# Patient Record
Sex: Male | Born: 1945
Health system: Southern US, Community
[De-identification: ages and names within clinical notes are randomized; demographics above are authoritative.]

## PROBLEM LIST (undated history)

## (undated) DIAGNOSIS — E785 Hyperlipidemia, unspecified: Secondary | ICD-10-CM

## (undated) DIAGNOSIS — M79673 Pain in unspecified foot: Secondary | ICD-10-CM

## (undated) DIAGNOSIS — N289 Disorder of kidney and ureter, unspecified: Secondary | ICD-10-CM

## (undated) DIAGNOSIS — I10 Essential (primary) hypertension: Secondary | ICD-10-CM

## (undated) DIAGNOSIS — G8929 Other chronic pain: Secondary | ICD-10-CM

## (undated) DIAGNOSIS — M79606 Pain in leg, unspecified: Secondary | ICD-10-CM

## (undated) DIAGNOSIS — I8390 Asymptomatic varicose veins of unspecified lower extremity: Secondary | ICD-10-CM

## (undated) DIAGNOSIS — F419 Anxiety disorder, unspecified: Secondary | ICD-10-CM

## (undated) DIAGNOSIS — F329 Major depressive disorder, single episode, unspecified: Secondary | ICD-10-CM

## (undated) DIAGNOSIS — H269 Unspecified cataract: Secondary | ICD-10-CM

## (undated) DIAGNOSIS — N189 Chronic kidney disease, unspecified: Secondary | ICD-10-CM

## (undated) DIAGNOSIS — T7840XA Allergy, unspecified, initial encounter: Secondary | ICD-10-CM

## (undated) DIAGNOSIS — K449 Diaphragmatic hernia without obstruction or gangrene: Secondary | ICD-10-CM

## (undated) HISTORY — PX: INGUINAL HERNIA REPAIR: SHX194

## (undated) HISTORY — PX: VARICOSE VEIN SURGERY: SHX832

## (undated) HISTORY — DX: Hyperlipidemia, unspecified: E78.5

## (undated) HISTORY — PX: COLONOSCOPY: SHX174

## (undated) HISTORY — DX: Diaphragmatic hernia without obstruction or gangrene: K44.9

## (undated) HISTORY — DX: Essential (primary) hypertension: I10

## (undated) HISTORY — DX: Major depressive disorder, single episode, unspecified: F32.9

## (undated) HISTORY — DX: Asymptomatic varicose veins of unspecified lower extremity: I83.90

## (undated) HISTORY — PX: COLONOSCOPY W/ POLYPECTOMY: SHX1380

## (undated) HISTORY — DX: Unspecified cataract: H26.9

## (undated) HISTORY — DX: Allergy, unspecified, initial encounter: T78.40XA

## (undated) HISTORY — PX: CHOLECYSTECTOMY: SHX55

## (undated) HISTORY — DX: Anxiety disorder, unspecified: F41.9

## (undated) HISTORY — DX: Pain in unspecified foot: M79.673

## (undated) HISTORY — DX: Other chronic pain: G89.29

## (undated) HISTORY — PX: CARPAL TUNNEL RELEASE: SHX101

## (undated) HISTORY — PX: EYE SURGERY: SHX253

## (undated) HISTORY — DX: Chronic kidney disease, unspecified: N18.9

## (undated) HISTORY — DX: Disorder of kidney and ureter, unspecified: N28.9

## (undated) HISTORY — DX: Pain in leg, unspecified: M79.606

---

## 2000-03-21 HISTORY — PX: JOINT REPLACEMENT: SHX530

## 2000-03-21 HISTORY — PX: TOTAL KNEE ARTHROPLASTY: SHX125

## 2002-02-11 ENCOUNTER — Encounter: Payer: Self-pay | Admitting: Orthopedic Surgery

## 2002-02-18 ENCOUNTER — Inpatient Hospital Stay (HOSPITAL_COMMUNITY): Admission: RE | Admit: 2002-02-18 | Discharge: 2002-02-22 | Payer: Self-pay | Admitting: Orthopedic Surgery

## 2002-09-16 ENCOUNTER — Ambulatory Visit (HOSPITAL_COMMUNITY): Admission: RE | Admit: 2002-09-16 | Discharge: 2002-09-16 | Payer: Self-pay | Admitting: Specialist

## 2002-10-28 ENCOUNTER — Ambulatory Visit (HOSPITAL_COMMUNITY): Admission: RE | Admit: 2002-10-28 | Discharge: 2002-10-28 | Payer: Self-pay | Admitting: Specialist

## 2004-04-16 ENCOUNTER — Ambulatory Visit: Payer: Self-pay | Admitting: Family Medicine

## 2004-09-14 ENCOUNTER — Ambulatory Visit: Payer: Self-pay | Admitting: Internal Medicine

## 2004-09-23 ENCOUNTER — Ambulatory Visit: Payer: Self-pay | Admitting: Internal Medicine

## 2004-09-28 ENCOUNTER — Ambulatory Visit: Payer: Self-pay | Admitting: Internal Medicine

## 2004-10-12 ENCOUNTER — Encounter (INDEPENDENT_AMBULATORY_CARE_PROVIDER_SITE_OTHER): Payer: Self-pay | Admitting: Specialist

## 2004-10-12 ENCOUNTER — Ambulatory Visit: Payer: Self-pay | Admitting: Internal Medicine

## 2004-10-12 LAB — HM COLONOSCOPY

## 2004-10-21 ENCOUNTER — Ambulatory Visit: Payer: Self-pay | Admitting: Internal Medicine

## 2005-03-15 ENCOUNTER — Ambulatory Visit: Payer: Self-pay | Admitting: Internal Medicine

## 2005-04-01 ENCOUNTER — Ambulatory Visit: Payer: Self-pay | Admitting: Internal Medicine

## 2006-01-11 ENCOUNTER — Ambulatory Visit: Payer: Self-pay | Admitting: Internal Medicine

## 2006-03-08 ENCOUNTER — Ambulatory Visit: Payer: Self-pay | Admitting: Internal Medicine

## 2006-12-04 DIAGNOSIS — N259 Disorder resulting from impaired renal tubular function, unspecified: Secondary | ICD-10-CM | POA: Insufficient documentation

## 2006-12-04 DIAGNOSIS — E785 Hyperlipidemia, unspecified: Secondary | ICD-10-CM | POA: Insufficient documentation

## 2007-02-22 ENCOUNTER — Ambulatory Visit: Payer: Self-pay | Admitting: Internal Medicine

## 2007-02-22 LAB — CONVERTED CEMR LAB
ALT: 22 units/L (ref 0–53)
AST: 15 units/L (ref 0–37)
Albumin: 3.7 g/dL (ref 3.5–5.2)
Alkaline Phosphatase: 90 units/L (ref 39–117)
BUN: 18 mg/dL (ref 6–23)
Basophils Absolute: 0 10*3/uL (ref 0.0–0.1)
Basophils Relative: 0.1 % (ref 0.0–1.0)
Bilirubin Urine: NEGATIVE
Bilirubin, Direct: 0.2 mg/dL (ref 0.0–0.3)
Blood in Urine, dipstick: NEGATIVE
CO2: 31 meq/L (ref 19–32)
Calcium: 9.1 mg/dL (ref 8.4–10.5)
Chloride: 109 meq/L (ref 96–112)
Cholesterol: 207 mg/dL (ref 0–200)
Creatinine, Ser: 1.4 mg/dL (ref 0.4–1.5)
Direct LDL: 136.3 mg/dL
Eosinophils Absolute: 0.1 10*3/uL (ref 0.0–0.6)
Eosinophils Relative: 2.5 % (ref 0.0–5.0)
GFR calc Af Amer: 66 mL/min
GFR calc non Af Amer: 55 mL/min
Glucose, Bld: 89 mg/dL (ref 70–99)
Glucose, Urine, Semiquant: NEGATIVE
HCT: 43.2 % (ref 39.0–52.0)
HDL: 34.2 mg/dL — ABNORMAL LOW (ref >39.0)
Hemoglobin: 15 g/dL (ref 13.0–17.0)
Ketones, urine, test strip: NEGATIVE
Lymphocytes Relative: 27.6 % (ref 12.0–46.0)
MCHC: 34.7 g/dL (ref 30.0–36.0)
MCV: 84.8 fL (ref 78.0–100.0)
Monocytes Absolute: 0.4 10*3/uL (ref 0.2–0.7)
Monocytes Relative: 9.3 % (ref 3.0–11.0)
Neutro Abs: 2.5 10*3/uL (ref 1.4–7.7)
Neutrophils Relative %: 60.5 % (ref 43.0–77.0)
Nitrite: NEGATIVE
PSA: 1.4 ng/mL (ref 0.10–4.00)
Platelets: 118 10*3/uL — ABNORMAL LOW (ref 150–400)
Potassium: 5.3 meq/L — ABNORMAL HIGH (ref 3.5–5.1)
Protein, U semiquant: NEGATIVE
RBC: 5.1 M/uL (ref 4.22–5.81)
RDW: 14 % (ref 11.5–14.6)
Sodium: 144 meq/L (ref 135–145)
Specific Gravity, Urine: 1.02
TSH: 2.03 microintl units/mL (ref 0.35–5.50)
Total Bilirubin: 0.8 mg/dL (ref 0.3–1.2)
Total CHOL/HDL Ratio: 6.1
Total Protein: 5.8 g/dL — ABNORMAL LOW (ref 6.0–8.3)
Triglycerides: 164 mg/dL — ABNORMAL HIGH (ref 0–149)
Urobilinogen, UA: 0.2
VLDL: 33 mg/dL (ref 0–40)
WBC Urine, dipstick: NEGATIVE
WBC: 4.2 10*3/uL — ABNORMAL LOW (ref 4.5–10.5)
pH: 5

## 2007-03-02 ENCOUNTER — Ambulatory Visit: Payer: Self-pay | Admitting: Internal Medicine

## 2007-03-02 DIAGNOSIS — I8310 Varicose veins of unspecified lower extremity with inflammation: Secondary | ICD-10-CM | POA: Insufficient documentation

## 2007-03-02 DIAGNOSIS — I10 Essential (primary) hypertension: Secondary | ICD-10-CM | POA: Insufficient documentation

## 2007-04-04 ENCOUNTER — Telehealth (INDEPENDENT_AMBULATORY_CARE_PROVIDER_SITE_OTHER): Payer: Self-pay | Admitting: *Deleted

## 2007-04-09 ENCOUNTER — Ambulatory Visit: Payer: Self-pay | Admitting: Internal Medicine

## 2007-04-09 DIAGNOSIS — N433 Hydrocele, unspecified: Secondary | ICD-10-CM | POA: Insufficient documentation

## 2007-06-27 ENCOUNTER — Ambulatory Visit: Payer: Self-pay | Admitting: Internal Medicine

## 2007-06-27 DIAGNOSIS — T887XXA Unspecified adverse effect of drug or medicament, initial encounter: Secondary | ICD-10-CM | POA: Insufficient documentation

## 2007-06-27 LAB — CONVERTED CEMR LAB
ALT: 35 units/L (ref 0–53)
AST: 22 units/L (ref 0–37)
Albumin: 3.7 g/dL (ref 3.5–5.2)
Alkaline Phosphatase: 89 units/L (ref 39–117)
Bilirubin, Direct: 0.1 mg/dL (ref 0.0–0.3)
Cholesterol: 235 mg/dL (ref 0–200)
Direct LDL: 139.6 mg/dL
HDL: 33.2 mg/dL — ABNORMAL LOW (ref >39.0)
Total Bilirubin: 1 mg/dL (ref 0.3–1.2)
Total CHOL/HDL Ratio: 7.1
Total Protein: 6.2 g/dL (ref 6.0–8.3)
Triglycerides: 270 mg/dL (ref 0–149)
VLDL: 54 mg/dL — ABNORMAL HIGH (ref 0–40)

## 2007-07-06 ENCOUNTER — Ambulatory Visit: Payer: Self-pay | Admitting: Internal Medicine

## 2007-07-06 DIAGNOSIS — J309 Allergic rhinitis, unspecified: Secondary | ICD-10-CM | POA: Insufficient documentation

## 2007-08-03 ENCOUNTER — Ambulatory Visit: Payer: Self-pay | Admitting: Internal Medicine

## 2007-08-03 DIAGNOSIS — M109 Gout, unspecified: Secondary | ICD-10-CM | POA: Insufficient documentation

## 2007-08-03 LAB — CONVERTED CEMR LAB
BUN: 16 mg/dL (ref 6–23)
CO2: 31 meq/L (ref 19–32)
Calcium: 9.1 mg/dL (ref 8.4–10.5)
Chloride: 110 meq/L (ref 96–112)
Creatinine, Ser: 1.4 mg/dL (ref 0.4–1.5)
GFR calc Af Amer: 66 mL/min
GFR calc non Af Amer: 54 mL/min
Glucose, Bld: 68 mg/dL — ABNORMAL LOW (ref 70–99)
Potassium: 4.2 meq/L (ref 3.5–5.1)
Sodium: 146 meq/L — ABNORMAL HIGH (ref 135–145)
Uric Acid, Serum: 6.5 mg/dL (ref 4.0–7.8)

## 2007-09-03 ENCOUNTER — Ambulatory Visit: Payer: Self-pay | Admitting: Internal Medicine

## 2007-10-17 ENCOUNTER — Ambulatory Visit (HOSPITAL_BASED_OUTPATIENT_CLINIC_OR_DEPARTMENT_OTHER): Admission: RE | Admit: 2007-10-17 | Discharge: 2007-10-17 | Payer: Self-pay | Admitting: Urology

## 2007-11-28 ENCOUNTER — Ambulatory Visit: Payer: Self-pay | Admitting: Internal Medicine

## 2007-11-28 LAB — CONVERTED CEMR LAB
ALT: 23 units/L (ref 0–53)
AST: 15 units/L (ref 0–37)
Albumin: 3.7 g/dL (ref 3.5–5.2)
Alkaline Phosphatase: 106 units/L (ref 39–117)
Bilirubin, Direct: 0.1 mg/dL (ref 0.0–0.3)
Cholesterol: 160 mg/dL (ref 0–200)
Direct LDL: 88.3 mg/dL
HDL: 23.9 mg/dL — ABNORMAL LOW (ref >39.0)
Total Bilirubin: 0.8 mg/dL (ref 0.3–1.2)
Total CHOL/HDL Ratio: 6.7
Total Protein: 6 g/dL (ref 6.0–8.3)
Triglycerides: 214 mg/dL (ref 0–149)
VLDL: 43 mg/dL — ABNORMAL HIGH (ref 0–40)

## 2007-12-05 ENCOUNTER — Ambulatory Visit: Payer: Self-pay | Admitting: Internal Medicine

## 2008-02-25 ENCOUNTER — Ambulatory Visit: Payer: Self-pay | Admitting: Internal Medicine

## 2008-02-25 LAB — CONVERTED CEMR LAB
ALT: 22 units/L (ref 0–53)
AST: 16 units/L (ref 0–37)
Basophils Absolute: 0 10*3/uL (ref 0.0–0.1)
Bilirubin Urine: NEGATIVE
Bilirubin, Direct: 0.1 mg/dL (ref 0.0–0.3)
Blood in Urine, dipstick: NEGATIVE
CO2: 28 meq/L (ref 19–32)
Chloride: 109 meq/L (ref 96–112)
Cholesterol: 171 mg/dL (ref 0–200)
GFR calc non Af Amer: 65 mL/min
Glucose, Urine, Semiquant: NEGATIVE
HDL: 35.6 mg/dL — ABNORMAL LOW (ref >39.0)
Ketones, urine, test strip: NEGATIVE
LDL Cholesterol: 98 mg/dL (ref 0–99)
Lymphocytes Relative: 25.2 % (ref 12.0–46.0)
MCHC: 34 g/dL (ref 30.0–36.0)
Neutrophils Relative %: 64.2 % (ref 43.0–77.0)
Nitrite: NEGATIVE
Platelets: 130 10*3/uL — ABNORMAL LOW (ref 150–400)
Protein, U semiquant: NEGATIVE
RDW: 14.2 % (ref 11.5–14.6)
Sodium: 145 meq/L (ref 135–145)
Specific Gravity, Urine: 1.02
TSH: 2.48 microintl units/mL (ref 0.35–5.50)
Total Bilirubin: 0.7 mg/dL (ref 0.3–1.2)
Triglycerides: 188 mg/dL — ABNORMAL HIGH (ref 0–149)
Urobilinogen, UA: 0.2
VLDL: 38 mg/dL (ref 0–40)
WBC Urine, dipstick: NEGATIVE
pH: 5

## 2008-03-03 ENCOUNTER — Ambulatory Visit: Payer: Self-pay | Admitting: Internal Medicine

## 2008-03-03 DIAGNOSIS — R972 Elevated prostate specific antigen [PSA]: Secondary | ICD-10-CM | POA: Insufficient documentation

## 2008-03-19 ENCOUNTER — Telehealth: Payer: Self-pay | Admitting: Family Medicine

## 2008-05-06 ENCOUNTER — Ambulatory Visit: Payer: Self-pay | Admitting: Internal Medicine

## 2008-05-20 LAB — CONVERTED CEMR LAB
PSA, Free Pct: 23 — ABNORMAL LOW (ref >25)
PSA, Free: 0.4 ng/mL

## 2008-06-03 ENCOUNTER — Ambulatory Visit: Payer: Self-pay | Admitting: Internal Medicine

## 2008-06-03 DIAGNOSIS — N400 Enlarged prostate without lower urinary tract symptoms: Secondary | ICD-10-CM | POA: Insufficient documentation

## 2008-06-03 DIAGNOSIS — G5620 Lesion of ulnar nerve, unspecified upper limb: Secondary | ICD-10-CM | POA: Insufficient documentation

## 2008-06-25 ENCOUNTER — Encounter: Payer: Self-pay | Admitting: Internal Medicine

## 2008-07-09 ENCOUNTER — Encounter: Payer: Self-pay | Admitting: Internal Medicine

## 2008-07-19 HISTORY — PX: OTHER SURGICAL HISTORY: SHX169

## 2008-07-23 ENCOUNTER — Ambulatory Visit (HOSPITAL_COMMUNITY): Admission: RE | Admit: 2008-07-23 | Discharge: 2008-07-23 | Payer: Self-pay | Admitting: Neurological Surgery

## 2008-08-04 ENCOUNTER — Ambulatory Visit: Payer: Self-pay | Admitting: Internal Medicine

## 2008-08-04 DIAGNOSIS — F528 Other sexual dysfunction not due to a substance or known physiological condition: Secondary | ICD-10-CM | POA: Insufficient documentation

## 2008-11-19 ENCOUNTER — Ambulatory Visit: Payer: Self-pay | Admitting: Internal Medicine

## 2009-02-18 ENCOUNTER — Ambulatory Visit: Payer: Self-pay | Admitting: Internal Medicine

## 2009-02-18 DIAGNOSIS — M549 Dorsalgia, unspecified: Secondary | ICD-10-CM | POA: Insufficient documentation

## 2009-02-18 LAB — CONVERTED CEMR LAB: PSA: 1.41 ng/mL (ref 0.10–4.00)

## 2009-02-25 ENCOUNTER — Telehealth: Payer: Self-pay | Admitting: Internal Medicine

## 2009-02-27 ENCOUNTER — Ambulatory Visit (HOSPITAL_BASED_OUTPATIENT_CLINIC_OR_DEPARTMENT_OTHER): Admission: RE | Admit: 2009-02-27 | Discharge: 2009-02-27 | Payer: Self-pay | Admitting: Orthopedic Surgery

## 2009-05-11 ENCOUNTER — Telehealth: Payer: Self-pay | Admitting: Internal Medicine

## 2009-05-15 ENCOUNTER — Ambulatory Visit (HOSPITAL_COMMUNITY): Admission: RE | Admit: 2009-05-15 | Discharge: 2009-05-15 | Payer: Self-pay | Admitting: Orthopedic Surgery

## 2009-05-19 ENCOUNTER — Ambulatory Visit: Payer: Self-pay | Admitting: Internal Medicine

## 2009-05-19 LAB — CONVERTED CEMR LAB
Alkaline Phosphatase: 80 units/L (ref 39–117)
Bilirubin, Direct: 0.1 mg/dL (ref 0.0–0.3)
Calcium: 8.4 mg/dL (ref 8.4–10.5)
Cholesterol, target level: 200 mg/dL
Cholesterol: 193 mg/dL (ref 0–200)
GFR calc non Af Amer: 49.92 mL/min (ref >60)
Glucose, Bld: 121 mg/dL — ABNORMAL HIGH (ref 70–99)
HDL: 43.1 mg/dL (ref >39.00)
PSA: 1.37 ng/mL (ref 0.10–4.00)
Potassium: 4.5 meq/L (ref 3.5–5.1)
Sodium: 145 meq/L (ref 135–145)
Total Bilirubin: 0.4 mg/dL (ref 0.3–1.2)
Total Protein: 6 g/dL (ref 6.0–8.3)

## 2009-07-06 ENCOUNTER — Observation Stay (HOSPITAL_COMMUNITY): Admission: AD | Admit: 2009-07-06 | Discharge: 2009-07-09 | Payer: Self-pay | Admitting: Orthopedic Surgery

## 2009-08-21 ENCOUNTER — Ambulatory Visit: Payer: Self-pay | Admitting: Internal Medicine

## 2009-08-21 DIAGNOSIS — L570 Actinic keratosis: Secondary | ICD-10-CM | POA: Insufficient documentation

## 2009-09-04 ENCOUNTER — Encounter (INDEPENDENT_AMBULATORY_CARE_PROVIDER_SITE_OTHER): Payer: Self-pay | Admitting: *Deleted

## 2009-10-07 ENCOUNTER — Telehealth: Payer: Self-pay | Admitting: Internal Medicine

## 2009-11-10 ENCOUNTER — Telehealth: Payer: Self-pay | Admitting: Internal Medicine

## 2009-12-07 ENCOUNTER — Telehealth: Payer: Self-pay | Admitting: Internal Medicine

## 2010-01-13 ENCOUNTER — Telehealth: Payer: Self-pay | Admitting: Internal Medicine

## 2010-02-17 ENCOUNTER — Ambulatory Visit: Payer: Self-pay | Admitting: Internal Medicine

## 2010-02-17 DIAGNOSIS — Z86010 Personal history of colonic polyps: Secondary | ICD-10-CM | POA: Insufficient documentation

## 2010-02-17 LAB — CONVERTED CEMR LAB
Basophils Absolute: 0 10*3/uL (ref 0.0–0.1)
Basophils Relative: 0.6 % (ref 0.0–3.0)
Eosinophils Relative: 1.2 % (ref 0.0–5.0)
HCT: 44.1 % (ref 39.0–52.0)
Hemoglobin: 15.1 g/dL (ref 13.0–17.0)
Lymphocytes Relative: 20.5 % (ref 12.0–46.0)
Lymphs Abs: 1.1 10*3/uL (ref 0.7–4.0)
Monocytes Relative: 7.2 % (ref 3.0–12.0)
Neutro Abs: 3.7 10*3/uL (ref 1.4–7.7)
PSA, Free Pct: 39 (ref >25)
PSA, Free: 0.6 ng/mL
RBC: 5.08 M/uL (ref 4.22–5.81)
WBC: 5.2 10*3/uL (ref 4.5–10.5)

## 2010-03-04 ENCOUNTER — Encounter: Payer: Self-pay | Admitting: Internal Medicine

## 2010-04-06 ENCOUNTER — Encounter (INDEPENDENT_AMBULATORY_CARE_PROVIDER_SITE_OTHER): Payer: Self-pay | Admitting: *Deleted

## 2010-04-09 ENCOUNTER — Ambulatory Visit
Admission: RE | Admit: 2010-04-09 | Discharge: 2010-04-09 | Payer: Self-pay | Source: Home / Self Care | Attending: Internal Medicine | Admitting: Internal Medicine

## 2010-04-20 NOTE — Assessment & Plan Note (Signed)
**Note De-Identified Mckissic Obfuscation** Summary: 3 month rov/njr   Vital Signs:  Patient profile:   65 year old male Height:      62 inches Weight:      258 pounds BMI:     47.36 Temp:     98.2 degrees F oral Pulse rate:   72 / minute Resp:     14 per minute BP sitting:   140 / 80  (left arm)  Vitals Entered By: Willy Eddy, LPN (May 20, 4538 10:59 AM) CC: roa - continues to take meds for gout, Hypertension Management, Lipid Management   CC:  roa - continues to take meds for gout, Hypertension Management, and Lipid Management.  History of Present Illness: gaining weight discussed the stress and overeating monitering the HTN   Hypertension History:      He denies headache, chest pain, palpitations, dyspnea with exertion, orthopnea, PND, peripheral edema, visual symptoms, neurologic problems, syncope, and side effects from treatment.  stable.        Positive major cardiovascular risk factors include male age 15 years old or older, hyperlipidemia, and hypertension.  Negative major cardiovascular risk factors include negative family history for ischemic heart disease and non-tobacco-user status.        Further assessment for target organ damage reveals no history of ASHD, stroke/TIA, or peripheral vascular disease.    Lipid Management History:      Positive NCEP/ATP III risk factors include male age 65 years old or older, HDL cholesterol less than 40, and hypertension.  Negative NCEP/ATP III risk factors include no family history for ischemic heart disease, non-tobacco-user status, no ASHD (atherosclerotic heart disease), no prior stroke/TIA, and no peripheral vascular disease.      Preventive Screening-Counseling & Management  Alcohol-Tobacco     Smoking Status: never     Passive Smoke Exposure: no  Problems Prior to Update: 1)  Back Pain, Chronic  (ICD-724.5) 2)  Erectile Dysfunction  (ICD-302.72) 3)  Ulnar Neuropathy, Left  (ICD-354.2) 4)  Benign Prostatic Hypertrophy, With Obstruction  (ICD-600.01) 5)   Elevated Prostate Specific Antigen  (ICD-790.93) 6)  Uri  (ICD-465.9) 7)  Gout  (ICD-274.9) 8)  Allergic Rhinitis  (ICD-477.9) 9)  Uns Advrs Eff Uns Rx Medicinal&biological Sbstnc  (ICD-995.20) 10)  Hydrocele, Right  (ICD-603.9) 11)  Varicose Veins Lower Extremities W/inflammation  (ICD-454.1) 12)  Hypertension  (ICD-401.9) 13)  Physical Examination  (ICD-V70.0) 14)  Renal Insufficiency  (ICD-588.9) 15)  Hyperlipidemia  (ICD-272.4)  Current Problems (verified): 1)  Back Pain, Chronic  (ICD-724.5) 2)  Erectile Dysfunction  (ICD-302.72) 3)  Ulnar Neuropathy, Left  (ICD-354.2) 4)  Benign Prostatic Hypertrophy, With Obstruction  (ICD-600.01) 5)  Elevated Prostate Specific Antigen  (ICD-790.93) 6)  Uri  (ICD-465.9) 7)  Gout  (ICD-274.9) 8)  Allergic Rhinitis  (ICD-477.9) 9)  Uns Advrs Eff Uns Rx Medicinal&biological Sbstnc  (ICD-995.20) 10)  Hydrocele, Right  (ICD-603.9) 11)  Varicose Veins Lower Extremities W/inflammation  (ICD-454.1) 12)  Hypertension  (ICD-401.9) 13)  Physical Examination  (ICD-V70.0) 14)  Renal Insufficiency  (ICD-588.9) 15)  Hyperlipidemia  (ICD-272.4)  Medications Prior to Update: 1)  Viagra 100 Mg  Tabs (Sildenafil Citrate) .... One By Mouth Daily 2)  Xyzal 5 Mg  Tabs (Levocetirizine Dihydrochloride) .... One By Mouth Daily 3)  Flomax 0.4 Mg Cp24 (Tamsulosin Hcl) .... Take One By Mouth Daily 4)  Benicar 20 Mg Tabs (Olmesartan Medoxomil) .... One By Mouth Daily 5)  Vesicare 10 Mg Tabs (Solifenacin Succinate) .... One By Mouth Daily **Note De-Identified [place] Obfuscation** 6)  Avodart 0.5 Mg Caps (Dutasteride) .... One By Mouth Daily 7)  Simvastatin 40 Mg Tabs (Simvastatin) .... One By Mouth Daily 8)  Percocet 7.5-325 Mg Tabs (Oxycodone-Acetaminophen) .... One By Mouth Q 8 Hours Prn 9)  Doxycycline Hyclate 100 Mg Caps (Doxycycline Hyclate) .... One By Mouth Daily  Current Medications (verified): 1)  Viagra 100 Mg  Tabs (Sildenafil Citrate) .... One By Mouth Daily 2)  Xyzal 5 Mg  Tabs  (Levocetirizine Dihydrochloride) .... One By Mouth Daily 3)  Flomax 0.4 Mg Cp24 (Tamsulosin Hcl) .... Take One By Mouth Daily 4)  Benicar 20 Mg Tabs (Olmesartan Medoxomil) .... One By Mouth Daily 5)  Vesicare 10 Mg Tabs (Solifenacin Succinate) .... One By Mouth Daily 6)  Avodart 0.5 Mg Caps (Dutasteride) .... One By Mouth Daily 7)  Simvastatin 40 Mg Tabs (Simvastatin) .... One By Mouth Daily 8)  Percocet 7.5-325 Mg Tabs (Oxycodone-Acetaminophen) .... One By Mouth Q 8 Hours Prn 9)  Indomethacin 25 Mg Caps (Indomethacin) .Marland Kitchen.. 1 Three Times A Day 10)  Colcrys 0.6 Mg Tabs (Colchicine) .Marland Kitchen.. 1 Two Times A Day  Allergies (verified): 1)  ! Sulfa 2)  ! Septra Ds (Sulfamethoxazole-Trimethoprim) 3)  Ciprofloxacin Hcl (Ciprofloxacin Hcl)  Past History:  Social History: Last updated: 12/04/2006 Occupation: Married Never Smoked  Risk Factors: Smoking Status: never (05/19/2009) Passive Smoke Exposure: no (05/19/2009)  Past medical, surgical, family and social histories (including risk factors) reviewed, and no changes noted (except as noted below).  Past Medical History: Reviewed history from 08/03/2007 and no changes required. Chronic Leg & Foot Pain Hyperlipidemia Renal Disease Renal insufficiency Hiatal Hernia Hematuria Hypertension Allergic rhinitis Gout  Past Surgical History: Reviewed history from 08/04/2008 and no changes required. Colonoscopy Inguinal herniorrhaphy Colon polypectomy ulnar intrapment release 5.2010  Family History: Reviewed history and no changes required.  Social History: Reviewed history from 12/04/2006 and no changes required. Occupation: Married Never Smoked  Review of Systems  The patient denies anorexia, fever, weight loss, weight gain, vision loss, decreased hearing, hoarseness, chest pain, syncope, dyspnea on exertion, peripheral edema, prolonged cough, headaches, hemoptysis, abdominal pain, melena, hematochezia, severe  indigestion/heartburn, hematuria, incontinence, genital sores, muscle weakness, suspicious skin lesions, transient blindness, difficulty walking, depression, unusual weight change, abnormal bleeding, enlarged lymph nodes, angioedema, and breast masses.    Physical Exam  General:  Well-developed,well-nourished,in no acute distress; alert,appropriate and cooperative throughout examination Head:  male-pattern balding.  atraumatic.   Eyes:  pupils equal and pupils round.   Ears:  R ear normal and L ear normal.   Nose:  no external deformity and no nasal discharge.   Mouth:  posterior lymphoid hypertrophy and postnasal drip.   Neck:  No deformities, masses, or tenderness noted. Lungs:  normal respiratory effort and no wheezes.   Heart:  normal rate and regular rhythm.   Abdomen:  soft and non-tender.     Impression & Recommendations:  Problem # 1:  HYPERLIPIDEMIA (ICD-272.4)  His updated medication list for this problem includes:    Simvastatin 40 Mg Tabs (Simvastatin) ..... One by mouth daily  Labs Reviewed: SGOT: 16 (02/25/2008)   SGPT: 22 (02/25/2008)  Lipid Goals: Chol Goal: 200 (05/19/2009)   HDL Goal: 40 (05/19/2009)   LDL Goal: 130 (05/19/2009)   TG Goal: 150 (05/19/2009)  10 Yr Risk Heart Disease: 14 % Prior 10 Yr Risk Heart Disease: 7 % (02/18/2009)   HDL:35.6 (02/25/2008), 23.9 (11/28/2007)  LDL:98 (02/25/2008), DEL (18/84/1660)  Chol:171 (02/25/2008), 160 (11/28/2007)  Trig:188 (02/25/2008), 214 ( **Note De-Identified Cisek Obfuscation** 11/28/2007)  Orders: TLB-Cholesterol, HDL (83718-HDL) TLB-Cholesterol, Direct LDL (83721-DIRLDL) TLB-Cholesterol, Total (82465-CHO)  Problem # 2:  HYPERTENSION (ICD-401.9)  stable His updated medication list for this problem includes:    Benicar 20 Mg Tabs (Olmesartan medoxomil) ..... One by mouth daily  BP today: 140/80 Prior BP: 122/82 (02/18/2009)  10 Yr Risk Heart Disease: 14 % Prior 10 Yr Risk Heart Disease: 7 % (02/18/2009)  Labs Reviewed: K+: 4.8  (02/25/2008) Creat: : 1.2 (02/25/2008)   Chol: 171 (02/25/2008)   HDL: 35.6 (02/25/2008)   LDL: 98 (02/25/2008)   TG: 188 (02/25/2008)  Orders: TLB-BMP (Basic Metabolic Panel-BMET) (80048-METABOL)  Problem # 3:  RENAL INSUFFICIENCY (ICD-588.9) monitering renal function on benicar  Problem # 4:  GOUT (ICD-274.9) continueing the gout His updated medication list for this problem includes:    Colcrys 0.6 Mg Tabs (Colchicine) .Marland Kitchen... 1 two times a day  Orders: Venipuncture (88416) TLB-Uric Acid, Blood (84550-URIC)  Elevate extremity; warm compresses, symptomatic relief and medication as directed.   Complete Medication List: 1)  Viagra 100 Mg Tabs (Sildenafil citrate) .... One by mouth daily 2)  Xyzal 5 Mg Tabs (Levocetirizine dihydrochloride) .... One by mouth daily 3)  Flomax 0.4 Mg Cp24 (Tamsulosin hcl) .... Take one by mouth daily 4)  Benicar 20 Mg Tabs (Olmesartan medoxomil) .... One by mouth daily 5)  Vesicare 10 Mg Tabs (Solifenacin succinate) .... One by mouth daily 6)  Avodart 0.5 Mg Caps (Dutasteride) .... One by mouth daily 7)  Simvastatin 40 Mg Tabs (Simvastatin) .... One by mouth daily 8)  Percocet 7.5-325 Mg Tabs (Oxycodone-acetaminophen) .... One by mouth q 8 hours prn 9)  Indomethacin 25 Mg Caps (Indomethacin) .Marland Kitchen.. 1 three times a day 10)  Colcrys 0.6 Mg Tabs (Colchicine) .Marland Kitchen.. 1 two times a day  Other Orders: TLB-PSA (Prostate Specific Antigen) (84153-PSA) TLB-Hepatic/Liver Function Pnl (80076-HEPATIC)  Hypertension Assessment/Plan:      The patient's hypertensive risk group is category B: At least one risk factor (excluding diabetes) with no target organ damage.  His calculated 10 year risk of coronary heart disease is 14 %.  Today's blood pressure is 140/80.  His blood pressure goal is < 140/90.  Lipid Assessment/Plan:      Based on NCEP/ATP III, the patient's risk factor category is "2 or more risk factors and a calculated 10 year CAD risk of < 20%".  The patient's  lipid goals are as follows: Total cholesterol goal is 200; LDL cholesterol goal is 130; HDL cholesterol goal is 40; Triglyceride goal is 150.  His LDL cholesterol goal has been met.    Patient Instructions: 1)  weight loss 2)  stay away for atkins 3)  look up the DASH diet 4)  www.dashdiet[place].org 5)  Please schedule a follow-up appointment in 3 months. Prescriptions: PERCOCET 7.5-325 MG TABS (OXYCODONE-ACETAMINOPHEN) one by mouth q 8 hours prn  #90 x 0   Entered and Authorized by:   Stacie Glaze MD   Signed by:   Stacie Glaze MD on 05/19/2009   Method used:   Print then Give to Patient   RxID:   [id number]   Handout requested. VIAGRA 100 MG  TABS (SILDENAFIL CITRATE) one by mouth daily  #5 x 11   Entered and Authorized by:   Stacie Glaze MD   Signed by:   Stacie Glaze MD on 05/19/2009   Method used:   Print then Give to Patient   RxID:   [id number]

## 2010-04-20 NOTE — Assessment & Plan Note (Signed)
**Note De-Identified Leath Obfuscation** Summary: fup/cjr   Vital Signs:  Patient profile:   65 year old male Height:      62 inches Weight:      254 pounds BMI:     46.63 Temp:     98.2 degrees F oral Pulse rate:   76 / minute Resp:     14 per minute BP sitting:   140 / 90  (left arm)  Vitals Entered By: Willy Eddy, LPN (February 17, 2010 3:44 PM) CC: roa Is Patient Diabetic? No   CC:  roa.  History of Present Illness: pt states he will do a colon after the first of the year   Preventive Screening-Counseling & Management  Alcohol-Tobacco     Smoking Status: never     Passive Smoke Exposure: no  Current Problems (verified): 1)  Actinic Keratosis  (ICD-702.0) 2)  Back Pain, Chronic  (ICD-724.5) 3)  Erectile Dysfunction  (ICD-302.72) 4)  Ulnar Neuropathy, Left  (ICD-354.2) 5)  Benign Prostatic Hypertrophy, With Obstruction  (ICD-600.01) 6)  Elevated Prostate Specific Antigen  (ICD-790.93) 7)  Uri  (ICD-465.9) 8)  Gout  (ICD-274.9) 9)  Allergic Rhinitis  (ICD-477.9) 10)  Uns Advrs Eff Uns Rx Medicinal&biological Sbstnc  (ICD-995.20) 11)  Hydrocele, Right  (ICD-603.9) 12)  Varicose Veins Lower Extremities W/inflammation  (ICD-454.1) 13)  Hypertension  (ICD-401.9) 14)  Physical Examination  (ICD-V70.0) 15)  Renal Insufficiency  (ICD-588.9) 16)  Hyperlipidemia  (ICD-272.4)  Current Medications (verified): 1)  Viagra 100 Mg  Tabs (Sildenafil Citrate) .... One By Mouth Daily 2)  Benicar 20 Mg Tabs (Olmesartan Medoxomil) .... One By Mouth Daily 3)  Simvastatin 40 Mg Tabs (Simvastatin) .... One By Mouth Daily 4)  Percocet 7.5-325 Mg Tabs (Oxycodone-Acetaminophen) .... One By Mouth Q 8 Hours Prn 5)  Allopurinol 100 Mg Tabs (Allopurinol) .... One By Mouth Q Hs  Allergies (verified): 1)  ! Sulfa 2)  ! Septra Ds (Sulfamethoxazole-Trimethoprim) 3)  Ciprofloxacin Hcl (Ciprofloxacin Hcl)  Past History:  Social History: Last updated: 12/04/2006 Occupation: Married Never Smoked  Risk  Factors: Smoking Status: never (02/17/2010) Passive Smoke Exposure: no (02/17/2010)  Past medical, surgical, family and social histories (including risk factors) reviewed, and no changes noted (except as noted below).  Past Medical History: Reviewed history from 08/03/2007 and no changes required. Chronic Leg & Foot Pain Hyperlipidemia Renal Disease Renal insufficiency Hiatal Hernia Hematuria Hypertension Allergic rhinitis Gout  Past Surgical History: Reviewed history from 08/04/2008 and no changes required. Colonoscopy Inguinal herniorrhaphy Colon polypectomy ulnar intrapment release 5.2010  Family History: Reviewed history and no changes required.  Social History: Reviewed history from 12/04/2006 and no changes required. Occupation: Married Never Smoked  Review of Systems  The patient denies anorexia, fever, weight loss, weight gain, vision loss, decreased hearing, hoarseness, chest pain, syncope, dyspnea on exertion, peripheral edema, prolonged cough, headaches, hemoptysis, abdominal pain, melena, hematochezia, severe indigestion/heartburn, hematuria, incontinence, genital sores, muscle weakness, suspicious skin lesions, transient blindness, difficulty walking, depression, unusual weight change, abnormal bleeding, enlarged lymph nodes, angioedema, and breast masses.    Physical Exam  General:  Well-developed,well-nourished,in no acute distress; alert,appropriate and cooperative throughout examination Head:  male-pattern balding.  atraumatic.   Eyes:  pupils equal and pupils round.   Ears:  R ear normal and L ear normal.   Nose:  no external deformity and no nasal discharge.   Mouth:  posterior lymphoid hypertrophy and postnasal drip.   Neck:  ak on neckfull ROM and no masses.   Lungs:  normal **Note De-Identified Showers Obfuscation** respiratory effort and no wheezes.   Heart:  normal rate and regular rhythm.   Abdomen:  soft and non-tender.     Impression & Recommendations:  Problem # 1:  PERSONAL  HISTORY OF COLONIC POLYPS (ICD-V12.72)  referral for colon in [facility] Colonoscopy: Adenomatous Polyp (10/12/2004) Td Booster: Historical (02/18/2005)   Chol: 193 (05/19/2009)   HDL: 43.10 (05/19/2009)   LDL: 98 (02/25/2008)   TG: 188 (02/25/2008) TSH: 2.48 (02/25/2008)   PSA: 1.37 (05/19/2009) Next Colonoscopy due:: 10/2009 (03/03/2008)  Discussed using sunscreen, use of alcohol, drug use, self testicular exam, routine dental care, routine eye care, routine physical exam, seat belts, multiple vitamins, osteoporosis prevention, adequate calcium intake in diet, and recommendations for immunizations.  Discussed exercise and checking cholesterol.  Discussed gun safety, safe sex, and contraception. Also recommend checking PSA.  Orders: Gastroenterology Referral (GI)  Problem # 2:  BACK PAIN, CHRONIC (ICD-724.5) refill pain meds and not increased  3 rx of 30 given no refill until 90 days back pain stable pattern His updated medication list for this problem includes:    Percocet 7.5-325 Mg Tabs (Oxycodone-acetaminophen) ..... One by mouth q 8 hours prn  Discussed use of moist heat or ice, modified activities, medications, and stretching/strengthening exercises. Back care instructions given. To be seen in 2 weeks if no improvement; sooner if worsening of symptoms.   Orders: TLB-CBC Platelet - w/Differential (85025-CBCD) Specimen Handling (04540)  Problem # 3:  VARICOSE VEINS LOWER EXTREMITIES W/INFLAMMATION (ICD-454.1) still painfull  Problem # 4:  HYPERTENSION (ICD-401.9) stable His updated medication list for this problem includes:    Benicar 20 Mg Tabs (Olmesartan medoxomil) ..... One by mouth daily  BP today: 140/90 Prior BP: 140/84 (08/21/2009)  Prior 10 Yr Risk Heart Disease: 14 % (05/19/2009)  Labs Reviewed: K+: 4.5 (05/19/2009) Creat: : 1.5 (05/19/2009)   Chol: 193 (05/19/2009)   HDL: 43.10 (05/19/2009)   LDL: 98 (02/25/2008)   TG: 188 (02/25/2008)  Problem # 5:  ELEVATED  PROSTATE SPECIFIC ANTIGEN (ICD-790.93)  monitering  Orders: Venipuncture (98119) T-PSA Total ([address]) T-PSA Free ([address]) Specimen Handling (99000)  Complete Medication List: 1)  Viagra 100 Mg Tabs (Sildenafil citrate) .... One by mouth daily 2)  Benicar 20 Mg Tabs (Olmesartan medoxomil) .... One by mouth daily 3)  Simvastatin 40 Mg Tabs (Simvastatin) .... One by mouth daily 4)  Percocet 7.5-325 Mg Tabs (Oxycodone-acetaminophen) .... One by mouth q 8 hours prn 5)  Allopurinol 100 Mg Tabs (Allopurinol) .... One by mouth q hs  Other Orders: TLB-Uric Acid, Blood (84550-URIC)  Patient Instructions: 1)  Please schedule a follow-up appointment in 3 months. Prescriptions: PERCOCET 7.5-325 MG TABS (OXYCODONE-ACETAMINOPHEN) one by mouth q 8 hours prn  #30 x 0   Entered and Authorized by:   Stacie Glaze MD   Signed by:   Stacie Glaze MD on 02/17/2010   Method used:   Print then Give to Patient   RxID:   [id number] PERCOCET 7.5-325 MG TABS (OXYCODONE-ACETAMINOPHEN) one by mouth q 8 hours prn  #30 x 0   Entered and Authorized by:   Stacie Glaze MD   Signed by:   Stacie Glaze MD on 02/17/2010   Method used:   Print then Give to Patient   RxID:   [id number] PERCOCET 7.5-325 MG TABS (OXYCODONE-ACETAMINOPHEN) one by mouth q 8 hours prn  #30 x 0   Entered and Authorized by:   Stacie Glaze MD   Signed by:   Jonny Ruiz **Note De-Identified Biscardi Obfuscation** Carolynn Sayers MD on 02/17/2010   Method used:   Print then Give to Patient   RxID:   [id number]    Orders Added: 1)  Venipuncture [36415] 2)  T-PSA Total [[address]] 3)  T-PSA Free [[address]] 4)  TLB-CBC Platelet - w/Differential [85025-CBCD] 5)  TLB-Uric Acid, Blood [84550-URIC] 6)  Gastroenterology Referral [GI] 7)  Specimen Handling [99000] 8)  Est. Patient Level IV [60109]

## 2010-04-20 NOTE — Letter (Signed)
**Note De-Identified [place] Obfuscation** Summary: Colonoscopy Letter  [facility]  [address] [place], [place] [address]   Phone: [phone]  Fax: [phone]      September 04, 2009 MRN: [id number]   [facility] [place] [address] [place], [place]  [address]   Dear Mr. WINEGARDEN,   According to your medical record, it is time for you to schedule a Colonoscopy. The American Cancer Society recommends this procedure as a method to detect early colon cancer. Patients with a family history of colon cancer, or a personal history of colon polyps or inflammatory bowel disease are at increased risk.  This letter has beeen generated based on the recommendations made at the time of your procedure. If you feel that in your particular situation this may no longer apply, please contact our office.  Please call our office at [phone] to schedule this appointment or to update your records at your earliest convenience.  Thank you for cooperating with [place] to provide you with the very best care possible.   Sincerely,   Iva Boop, M.D.  [facility] Gastroenterology Division [phone]

## 2010-04-20 NOTE — Progress Notes (Signed)
**Note De-Identified Standre Obfuscation** Summary: Pt req script for Percocet 7.5-325mg   Phone Note Refill Request Call back at Home Phone [phone] Message from:  Patient on January 13, 2010 11:44 AM  Refills Requested: Medication #1:  PERCOCET 7.5-325 MG TABS one by mouth q 8 hours prn   Dosage confirmed as above?Dosage Confirmed Pls notify pt when ready for pick up.    Method Requested: Pick up at Office Initial call taken by: Lucy Antigua,  January 13, 2010 11:44 AM    Prescriptions: PERCOCET 7.5-325 MG TABS (OXYCODONE-ACETAMINOPHEN) one by mouth q 8 hours prn  #30 x 0   Entered by:   Willy Eddy, LPN   Authorized by:   Stacie Glaze MD   Signed by:   Willy Eddy, LPN on 09/11/7626   Method used:   Print then Give to Patient   RxID:   [id number]

## 2010-04-20 NOTE — Progress Notes (Signed)
**Note De-Identified Alpern Obfuscation** Summary: refill meds  Phone Note Refill Request Call back at [phone] Message from:  Patient--live call  Refills Requested: Medication #1:  PERCOCET 7.5-325 MG TABS one by mouth q 8 hours prn call when ready.  Initial call taken by: Warnell Forester,  November 10, 2009 9:29 AM Caller: Patient    Prescriptions: PERCOCET 7.5-325 MG TABS (OXYCODONE-ACETAMINOPHEN) one by mouth q 8 hours prn  #30 x 0   Entered by:   Willy Eddy, LPN   Authorized by:   Stacie Glaze MD   Signed by:   Willy Eddy, LPN on 84/13/2440   Method used:   Print then Give to Patient   RxID:   [id number]

## 2010-04-20 NOTE — Progress Notes (Signed)
**Note De-Identified Lofaro Obfuscation** Summary: Pt req work in appt this morning w/ Dr. Lovell Sheehan Only  Phone Note Call from Patient Call back at Home Phone [phone]   Caller: Patient Summary of Call: Pt called and said that his right foot is extremely swollen and painful. Pt can't walk on it. Pt is wanting a work in appt this morning with Dr Lovell Sheehan only.  Initial call taken by: Lucy Antigua,  May 11, 2009 8:35 AM  Follow-up for Phone Call        please triage Follow-up by: Willy Eddy, LPN,  May 11, 2009 9:06 AM  Additional Follow-up for Phone Call Additional follow up Details #1::        foot swollen, very painful, feels feverish- hard to walk or put a shoe on, since Friday. Additional Follow-up by: Raechel Ache, RN,  May 11, 2009 9:35 AM    Additional Follow-up for Phone Call Additional follow up Details #2::    Try Indocin 25 three times a day with food and Colchicine 0.6 two times a day #30 each no refill, OV if not better. Called to [facility] [phone] Follow-up by: Raechel Ache, RN,  May 11, 2009 9:44 AM

## 2010-04-20 NOTE — Progress Notes (Signed)
**Note De-Identified Uriostegui Obfuscation** Summary: REFILL REQUEST  Phone Note Refill Request Message from:  Patient on December 07, 2009 11:02 AM  Refills Requested: Medication #1:  PERCOCET 7.5-325 MG TABS one by mouth q 8 hours prn   Notes: Pt can be reached at [phone] when Rx is ready for p/u.    Initial call taken by: Debbra Riding,  December 07, 2009 11:02 AM    Prescriptions: PERCOCET 7.5-325 MG TABS (OXYCODONE-ACETAMINOPHEN) one by mouth q 8 hours prn  #30 x 0   Entered by:   Willy Eddy, LPN   Authorized by:   Stacie Glaze MD   Signed by:   Willy Eddy, LPN on 09/81/1914   Method used:   Print then Give to Patient   RxID:   [id number]

## 2010-04-20 NOTE — Assessment & Plan Note (Signed)
**Note De-Identified Daniel Brock** Summary: 3 month rov/njr rsc bmp/njr   Vital Signs:  Patient profile:   65 year old male Height:      62 inches Weight:      251 pounds BMI:     46.07 Temp:     98.2 degrees F oral Pulse rate:   76 / minute Resp:     14 per minute BP sitting:   140 / 84  (left arm)  Vitals Entered By: Willy Eddy, LPN (August 22, 7827 12:19 PM) CC: roa gout-  completed  indocin and colcrys and still had sx, but ate large amount of cherries and pt states gout pain subsided, Hypertension Management, Lipid Management   CC:  roa gout-  completed  indocin and colcrys and still had sx, but ate large amount of cherries and pt states gout pain subsided, Hypertension Management, and Lipid Management.  History of Present Illness: has lost 7 pounds! blood presure stab;e has persisant pain in legs from varicosities had no healing ulcer on chest wall  Hypertension History:      He denies headache, chest pain, palpitations, dyspnea with exertion, orthopnea, PND, peripheral edema, visual symptoms, neurologic problems, syncope, and side effects from treatment.        Positive major cardiovascular risk factors include male age 40 years old or older, hyperlipidemia, and hypertension.  Negative major cardiovascular risk factors include negative family history for ischemic heart disease and non-tobacco-user status.        Further assessment for target organ damage reveals no history of ASHD, stroke/TIA, or peripheral vascular disease.    Lipid Management History:      Positive NCEP/ATP III risk factors include male age 38 years old or older and hypertension.  Negative NCEP/ATP III risk factors include no family history for ischemic heart disease, non-tobacco-user status, no ASHD (atherosclerotic heart disease), no prior stroke/TIA, and no peripheral vascular disease.      Preventive Screening-Counseling & Management  Alcohol-Tobacco     Smoking Status: never     Passive Smoke Exposure: no  Problems Prior to  Update: 1)  Back Pain, Chronic  (ICD-724.5) 2)  Erectile Dysfunction  (ICD-302.72) 3)  Ulnar Neuropathy, Left  (ICD-354.2) 4)  Benign Prostatic Hypertrophy, With Obstruction  (ICD-600.01) 5)  Elevated Prostate Specific Antigen  (ICD-790.93) 6)  Uri  (ICD-465.9) 7)  Gout  (ICD-274.9) 8)  Allergic Rhinitis  (ICD-477.9) 9)  Uns Advrs Eff Uns Rx Medicinal&biological Sbstnc  (ICD-995.20) 10)  Hydrocele, Right  (ICD-603.9) 11)  Varicose Veins Lower Extremities W/inflammation  (ICD-454.1) 12)  Hypertension  (ICD-401.9) 13)  Physical Examination  (ICD-V70.0) 14)  Renal Insufficiency  (ICD-588.9) 15)  Hyperlipidemia  (ICD-272.4)  Current Problems (verified): 1)  Back Pain, Chronic  (ICD-724.5) 2)  Erectile Dysfunction  (ICD-302.72) 3)  Ulnar Neuropathy, Left  (ICD-354.2) 4)  Benign Prostatic Hypertrophy, With Obstruction  (ICD-600.01) 5)  Elevated Prostate Specific Antigen  (ICD-790.93) 6)  Uri  (ICD-465.9) 7)  Gout  (ICD-274.9) 8)  Allergic Rhinitis  (ICD-477.9) 9)  Uns Advrs Eff Uns Rx Medicinal&biological Sbstnc  (ICD-995.20) 10)  Hydrocele, Right  (ICD-603.9) 11)  Varicose Veins Lower Extremities W/inflammation  (ICD-454.1) 12)  Hypertension  (ICD-401.9) 13)  Physical Examination  (ICD-V70.0) 14)  Renal Insufficiency  (ICD-588.9) 15)  Hyperlipidemia  (ICD-272.4)  Medications Prior to Update: 1)  Viagra 100 Mg  Tabs (Sildenafil Citrate) .... One By Mouth Daily 2)  Xyzal 5 Mg  Tabs (Levocetirizine Dihydrochloride) .... One By Mouth Daily 3)  Flomax **Note De-Identified Klem Brock** 0.4 Mg Cp24 (Tamsulosin Hcl) .... Take One By Mouth Daily 4)  Benicar 20 Mg Tabs (Olmesartan Medoxomil) .... One By Mouth Daily 5)  Vesicare 10 Mg Tabs (Solifenacin Succinate) .... One By Mouth Daily 6)  Avodart 0.5 Mg Caps (Dutasteride) .... One By Mouth Daily 7)  Simvastatin 40 Mg Tabs (Simvastatin) .... One By Mouth Daily 8)  Percocet 7.5-325 Mg Tabs (Oxycodone-Acetaminophen) .... One By Mouth Q 8 Hours Prn 9)  Indomethacin 25 Mg  Caps (Indomethacin) .Marland Kitchen.. 1 Three Times A Day 10)  Colcrys 0.6 Mg Tabs (Colchicine) .Marland Kitchen.. 1 Two Times A Day  Current Medications (verified): 1)  Viagra 100 Mg  Tabs (Sildenafil Citrate) .... One By Mouth Daily 2)  Benicar 20 Mg Tabs (Olmesartan Medoxomil) .... One By Mouth Daily 3)  Simvastatin 40 Mg Tabs (Simvastatin) .... One By Mouth Daily 4)  Percocet 7.5-325 Mg Tabs (Oxycodone-Acetaminophen) .... One By Mouth Q 8 Hours Prn  Allergies (verified): 1)  ! Sulfa 2)  ! Septra Ds (Sulfamethoxazole-Trimethoprim) 3)  Ciprofloxacin Hcl (Ciprofloxacin Hcl)  Past History:  Social History: Last updated: 12/04/2006 Occupation: Married Never Smoked  Risk Factors: Smoking Status: never (08/21/2009) Passive Smoke Exposure: no (08/21/2009)  Past medical, surgical, family and social histories (including risk factors) reviewed, and no changes noted (except as noted below).  Past Medical History: Reviewed history from 08/03/2007 and no changes required. Chronic Leg & Foot Pain Hyperlipidemia Renal Disease Renal insufficiency Hiatal Hernia Hematuria Hypertension Allergic rhinitis Gout  Past Surgical History: Reviewed history from 08/04/2008 and no changes required. Colonoscopy Inguinal herniorrhaphy Colon polypectomy ulnar intrapment release 5.2010  Family History: Reviewed history and no changes required.  Social History: Reviewed history from 12/04/2006 and no changes required. Occupation: Married Never Smoked  Review of Systems  The patient denies anorexia, fever, weight loss, weight gain, vision loss, decreased hearing, hoarseness, chest pain, syncope, dyspnea on exertion, peripheral edema, prolonged cough, headaches, hemoptysis, abdominal pain, melena, hematochezia, severe indigestion/heartburn, hematuria, incontinence, genital sores, muscle weakness, suspicious skin lesions, transient blindness, difficulty walking, depression, unusual weight change, abnormal bleeding,  enlarged lymph nodes, angioedema, breast masses, and testicular masses.    Physical Exam  General:  Well-developed,well-nourished,in no acute distress; alert,appropriate and cooperative throughout examination Head:  male-pattern balding.  atraumatic.   Eyes:  pupils equal and pupils round.   Nose:  no external deformity and no nasal discharge.   Neck:  ak on neckfull ROM and no masses.   Lungs:  normal respiratory effort and no wheezes.   Heart:  normal rate and regular rhythm.   Abdomen:  soft and non-tender.   Msk:  No deformity or scoliosis noted of thoracic or lumbar spine.   Extremities:  No clubbing, cyanosis, edema, or deformity noted with normal full range of motion of all joints.   Neurologic:  alert & oriented X3 and cranial nerves II-XII intact.     Impression & Recommendations:  Problem # 1:  ELEVATED PROSTATE SPECIFIC ANTIGEN (ICD-790.93) monitering  Problem # 2:  GOUT (ICD-274.9) the pt has "treated gout with cherries!"  The following medications were removed from the medication list:    Colcrys 0.6 Mg Tabs (Colchicine) .Marland Kitchen... 1 two times a day His updated medication list for this problem includes:    Allopurinol 100 Mg Tabs (Allopurinol) ..... One by mouth q hs  Elevate extremity; warm compresses, symptomatic relief and medication as directed.   Problem # 3:  HYPERTENSION (ICD-401.9) reveiwed labs His updated medication list for this problem includes: **Note De-Identified Klauer Brock** Benicar 20 Mg Tabs (Olmesartan medoxomil) ..... One by mouth daily  BP today: 140/84 Prior BP: 140/80 (05/19/2009)  Prior 10 Yr Risk Heart Disease: 14 % (05/19/2009)  Labs Reviewed: K+: 4.5 (05/19/2009) Creat: : 1.5 (05/19/2009)   Chol: 193 (05/19/2009)   HDL: 43.10 (05/19/2009)   LDL: 98 (02/25/2008)   TG: 188 (02/25/2008)  Problem # 4:  VARICOSE VEINS LOWER EXTREMITIES W/INFLAMMATION (ICD-454.1) persistantt pain  Problem # 5:  ACTINIC KERATOSIS (ICD-702.0)  the lesion was identifies as a      AK     and 40 seconds of cryotherapy with the liguid nitrogen gun was apllied to the site. The pt tolerated the procedure and post procedure care was discussed  Orders: Cryotherapy/Destruction benign or premalignant lesion (1st lesion)  (17000)  Complete Medication List: 1)  Viagra 100 Mg Tabs (Sildenafil citrate) .... One by mouth daily 2)  Benicar 20 Mg Tabs (Olmesartan medoxomil) .... One by mouth daily 3)  Simvastatin 40 Mg Tabs (Simvastatin) .... One by mouth daily 4)  Percocet 7.5-325 Mg Tabs (Oxycodone-acetaminophen) .... One by mouth q 8 hours prn 5)  Allopurinol 100 Mg Tabs (Allopurinol) .... One by mouth q hs  Hypertension Assessment/Plan:      The patient's hypertensive risk group is category B: At least one risk factor (excluding diabetes) with no target organ damage.  His calculated 10 year risk of coronary heart disease is 14 %.  Today's blood pressure is 140/84.  His blood pressure goal is < 140/90.  Lipid Assessment/Plan:      Based on NCEP/ATP III, the patient's risk factor category is "2 or more risk factors and a calculated 10 year CAD risk of < 20%".  The patient's lipid goals are as follows: Total cholesterol goal is 200; LDL cholesterol goal is 130; HDL cholesterol goal is 40; Triglyceride goal is 150.  His LDL cholesterol goal has been met.    Patient Instructions: 1)  Please schedule a follow-up appointment in 3 months. Prescriptions: PERCOCET 7.5-325 MG TABS (OXYCODONE-ACETAMINOPHEN) one by mouth q 8 hours prn  #30 x 0   Entered and Authorized by:   Stacie Glaze MD   Signed by:   Stacie Glaze MD on 08/21/2009   Method used:   Print then Give to Patient   RxID:   [id number] SIMVASTATIN 40 MG TABS (SIMVASTATIN) one by mouth daily  #30 x 11   Entered and Authorized by:   Stacie Glaze MD   Signed by:   Stacie Glaze MD on 08/21/2009   Method used:   Print then Give to Patient   RxID:   [id number] BENICAR 20 MG TABS (OLMESARTAN MEDOXOMIL) one by mouth  daily  #30 x 11   Entered and Authorized by:   Stacie Glaze MD   Signed by:   Stacie Glaze MD on 08/21/2009   Method used:   Print then Give to Patient   RxID:   [id number] ALLOPURINOL 100 MG TABS (ALLOPURINOL) one by mouth q HS  #30 x 11   Entered and Authorized by:   Stacie Glaze MD   Signed by:   Stacie Glaze MD on 08/21/2009   Method used:   Print then Give to Patient   RxID:   [id number]

## 2010-04-20 NOTE — Progress Notes (Signed)
**Note De-Identified [place] Obfuscation** Summary: refill  Phone Note Refill Request Call back at [phone] Message from:  Patient--live call  Refills Requested: Medication #1:  PERCOCET 7.5-325 MG TABS one by mouth q 8 hours prn call pt when ready today.  Initial call taken by: Warnell Forester,  October 07, 2009 9:59 AM Caller: Patient---live call    Prescriptions: PERCOCET 7.5-325 MG TABS (OXYCODONE-ACETAMINOPHEN) one by mouth q 8 hours prn  #30 x 0   Entered by:   Willy Eddy, LPN   Authorized by:   Stacie Glaze MD   Signed by:   Willy Eddy, LPN on 45/40/9811   Method used:   Print then Give to Patient   RxID:   [id number]

## 2010-04-22 NOTE — Letter (Signed)
**Note De-Identified [place] Obfuscation** Summary: [facility] Instructions  [facility]  [address] [place], [place] [address]   Phone: [phone]  Fax: [phone]       Daniel Brock    02/25/1946    MRN: [id number]        Procedure Day /Date:  Friday 04/23/2010     Arrival Time: 7:30 am     Procedure Time: 8:30 am     Location of Procedure:                    _ x_  [facility] (4th Floor)                        PREPARATION FOR COLONOSCOPY WITH MOVIPREP   Starting 5 days prior to your procedure Sunday 1/29 do not eat nuts, seeds, popcorn, corn, beans, peas,  salads, or any raw vegetables.  Do not take any fiber supplements (e.g. Metamucil, Citrucel, and Benefiber).  THE DAY BEFORE YOUR PROCEDURE         DATE: Thursday 2/2  1.  Drink clear liquids the entire day-NO SOLID FOOD  2.  Do not drink anything colored red or purple.  Avoid juices with pulp.  No orange juice.  3.  Drink at least 64 oz. (8 glasses) of fluid/clear liquids during the day to prevent dehydration and help the prep work efficiently.  CLEAR LIQUIDS INCLUDE: Water Jello Ice Popsicles Tea (sugar ok, no milk/cream) Powdered fruit flavored drinks Coffee (sugar ok, no milk/cream) Gatorade Juice: apple, white grape, white cranberry  Lemonade Clear bullion, consomm, broth Carbonated beverages (any kind) Strained chicken noodle soup Hard Candy                             4.  In the morning, mix first dose of MoviPrep solution:    Empty 1 Pouch A and 1 Pouch B into the disposable container    Add lukewarm drinking water to the top line of the container. Mix to dissolve    Refrigerate (mixed solution should be used within 24 hrs)  5.  Begin drinking the prep at 5:00 p.m. The MoviPrep container is divided by 4 marks.   Every 15 minutes drink the solution down to the next mark (approximately 8 oz) until the full liter is complete.   6.  Follow completed prep with 16 oz of clear liquid of your choice (Nothing red or  purple).  Continue to drink clear liquids until bedtime.  7.  Before going to bed, mix second dose of MoviPrep solution:    Empty 1 Pouch A and 1 Pouch B into the disposable container    Add lukewarm drinking water to the top line of the container. Mix to dissolve    Refrigerate  THE DAY OF YOUR PROCEDURE      DATE: Friday 2/3  Beginning at 3:30 a.m. (5 hours before procedure):         1. Every 15 minutes, drink the solution down to the next mark (approx 8 oz) until the full liter is complete.  2. Follow completed prep with 16 oz. of clear liquid of your choice.    3. You may drink clear liquids until 6:30 am (2 HOURS BEFORE PROCEDURE).   MEDICATION INSTRUCTIONS  Unless otherwise instructed, you should take regular prescription medications with a small sip of water   as early as possible the morning of your **Note De-Identified [place] Obfuscation** procedure.          OTHER INSTRUCTIONS  You will need a responsible adult at least 66 years of age to accompany you and drive you home.   This person must remain in the waiting room during your procedure.  Wear loose fitting clothing that is easily removed.  Leave jewelry and other valuables at home.  However, you may wish to bring a book to read or  an iPod/MP3 player to listen to music as you wait for your procedure to start.  Remove all body piercing jewelry and leave at home.  Total time from sign-in until discharge is approximately 2-3 hours.  You should go home directly after your procedure and rest.  You can resume normal activities the  day after your procedure.  The day of your procedure you should not:   Drive   Make legal decisions   Operate machinery   Drink alcohol   Return to work  You will receive specific instructions about eating, activities and medications before you leave.    The above instructions have been reviewed and explained to me by   Ezra Sites RN  April 09, 2010 10:22 AM    I fully understand and can verbalize these  instructions _____________________________ Date _________

## 2010-04-22 NOTE — Miscellaneous (Signed)
**Note De-Identified [place] Obfuscation** Summary: [facility]  Clinical Lists Changes  Medications: Added new medication of MOVIPREP 100 GM  SOLR (PEG-KCL-NACL-NASULF-NA ASC-C) As per prep instructions. - Signed Rx of MOVIPREP 100 GM  SOLR (PEG-KCL-NACL-NASULF-NA ASC-C) As per prep instructions.;  #1 x 0;  Signed;  Entered by: Ezra Sites RN;  Authorized by: Iva Boop MD, [facility];  Method used: Electronically to [facility]*, [address] [place], [place]  [address], Ph: [phone], Fax: [phone] Allergies: Changed allergy or adverse reaction from SULFA to SULFA Changed allergy or adverse reaction from SEPTRA DS (SULFAMETHOXAZOLE-TRIMETHOPRIM) to SEPTRA DS (SULFAMETHOXAZOLE-TRIMETHOPRIM)    Prescriptions: MOVIPREP 100 GM  SOLR (PEG-KCL-NACL-NASULF-NA ASC-C) As per prep instructions.  #1 x 0   Entered by:   Ezra Sites RN   Authorized by:   Iva Boop MD, [facility]   Signed by:   Ezra Sites RN on 04/09/2010   Method used:   Electronically to        [facility]* (retail)       [address]       [place], [place]  [address]       Ph: [phone]       Fax: [phone]   RxID:   [id number]

## 2010-04-22 NOTE — Letter (Signed)
**Note De-Identified [place] Obfuscation** Summary: Pre Visit Letter Revised  [facility]  [address] [place], [place] [address]   Phone: [phone]  Fax: [phone]        03/04/2010 MRN: [id number]  [facility] [address] [place], [place]  [address]             Procedure Date:  04-23-10 8:30am           Dr Leone Payor  Welcome to the Gastroenterology Division at [facility].    You are scheduled to see a nurse for your pre-procedure visit on 04-09-10 at 10am on the 3rd floor at [facility], [address]  We ask that you try to arrive at our office 15 minutes prior to your appointment time to allow for check-in.  Please take a minute to review the attached form.  If you answer "Yes" to one or more of the questions on the first page, we ask that you call the person listed at your earliest opportunity.  If you answer "No" to all of the questions, please complete the rest of the form and bring it to your appointment.    Your nurse visit will consist of discussing your medical and surgical history, your immediate family medical history, and your medications.   If you are unable to list all of your medications on the form, please bring the medication bottles to your appointment and we will list them.  We will need to be aware of both prescribed and over the counter drugs.  We will need to know exact dosage information as well.    Please be prepared to read and sign documents such as consent forms, a financial agreement, and acknowledgement forms.  If necessary, and with your consent, a friend or relative is welcome to sit-in on the nurse visit with you.  Please bring your insurance card so that we may make a copy of it.  If your insurance requires a referral to see a specialist, please bring your referral form from your primary care physician.  No co-pay is required for this nurse visit.     If you cannot keep your appointment, please call [phone] to cancel or reschedule prior to your  appointment date.  This allows [place] the opportunity to schedule an appointment for another patient in need of care.    Thank you for choosing [facility] for your medical needs.  We appreciate the opportunity to care for you.  Please visit [place] at our website  to learn more about our practice.  Sincerely, The Gastroenterology Division

## 2010-04-23 ENCOUNTER — Other Ambulatory Visit: Payer: Self-pay | Admitting: Internal Medicine

## 2010-04-23 ENCOUNTER — Ambulatory Visit: Admit: 2010-04-23 | Payer: Self-pay | Admitting: Internal Medicine

## 2010-04-23 ENCOUNTER — Other Ambulatory Visit (AMBULATORY_SURGERY_CENTER): Payer: BC Managed Care – PPO | Admitting: Internal Medicine

## 2010-04-23 DIAGNOSIS — Z1211 Encounter for screening for malignant neoplasm of colon: Secondary | ICD-10-CM

## 2010-04-23 DIAGNOSIS — Z86010 Personal history of colonic polyps: Secondary | ICD-10-CM

## 2010-04-23 DIAGNOSIS — D126 Benign neoplasm of colon, unspecified: Secondary | ICD-10-CM

## 2010-04-27 ENCOUNTER — Encounter: Payer: Self-pay | Admitting: Internal Medicine

## 2010-05-06 NOTE — Letter (Signed)
**Note De-Identified [place] Obfuscation** Summary: Patient Notice- Polyp Results  [facility]  [address] [place], [place] [address]   Phone: [phone]  Fax: [phone]        April 27, 2010 MRN: [id number]    [facility] [address] [place], [place]  [address]    Dear Mr. FIORELLA,  The polyps removed from your colon were adenomatous. This means that they were pre-cancerous or that  they had the potential to change into cancer over time.   I recommend that you have a repeat colonoscopy in 3 years to determine if you have developed any new polyps over time and screen for colorectal cancer. If you develop any new rectal bleeding, abdominal pain or significant bowel habit changes, please contact us before then.  In addition to repeating colonoscopy, changing health habits may reduce your risk of having more colon or rectal  polyps and possibly, colorectal cancer. You may lower your risk of future polyps and colorectal cancer by adopting healthy habits such as not smoking or using tobacco (if you do), being physically active, losing weight (if overweight), and eating a diet which includes fruits and vegetables and limits red meat.  Please call us if you are having persistent problems or have questions about your condition that have not been fully answered at this time.  Sincerely,  Iva Boop MD, [facility]  This letter has been electronically signed by your physician.  Appended Document: Patient Notice- Polyp Results Letter Mailed

## 2010-05-06 NOTE — Procedures (Signed)
**Note De-Identified [place] Obfuscation** Summary: Colonoscopy   Colonoscopy  Procedure date:  04/23/2010  Findings:      Location:  [facility].   COLONOSCOPY PROCEDURE REPORT  PATIENT:  Daniel Brock, Daniel Brock  MR#:  [id number] BIRTHDATE:   August 19, 1945, 64 yrs. old   GENDER:   male ENDOSCOPIST:   Iva Boop, MD, [facility]   PROCEDURE DATE:  04/23/2010 PROCEDURE:  Colonoscopy with biopsy and snare polypectomy ASA CLASS:   Class I INDICATIONS: surveillance and high-risk screening, history of pre-cancerous (adenomatous) colon polyps Two adenomas removed 09/2004, max 8mm. MEDICATIONS:    Fentanyl 50 mcg IV, Versed 5 mg IV  DESCRIPTION OF PROCEDURE:   After the risks benefits and alternatives of the procedure were thoroughly explained, informed consent was obtained.  Digital rectal exam was performed and revealed no abnormalities and normal prostate.   The LB 180AL [id number] endoscope was introduced through the anus and advanced to the cecum, which was identified by both the appendix and ileocecal valve, without limitations.  The quality of the prep was excellent, using MoviPrep.  The instrument was then slowly withdrawn as the colon was fully examined. Insertion: 1:27 minutes and withdrawal: 18:15 minutes <<PROCEDUREIMAGES>>            <<OLD IMAGES>>  FINDINGS:  Five polyps were found in the right colon. Ascending (2), transverse (2) all 1-2 mm and hepatic flexure (6-58mm).  This was otherwise a normal examination of the colon.   Retroflexed views in the rectum revealed no abnormalities.    The scope was then withdrawn from the patient and the procedure completed.  COMPLICATIONS:   None  ENDOSCOPIC IMPRESSION:  1) Five polyps removed from the right colon, 4 were 1-2 mm and 1 was 6-41mm  2) Otherwise normal examination with excellent prep. 3) Personal history of adenomas (2) in 09/2004    REPEAT EXAM:   In for Colonoscopy, pending biopsy results.    Iva Boop, MD, [name]  CC: The Patient

## 2010-05-25 ENCOUNTER — Other Ambulatory Visit: Payer: Self-pay | Admitting: Internal Medicine

## 2010-05-25 ENCOUNTER — Encounter: Payer: Self-pay | Admitting: Internal Medicine

## 2010-05-25 ENCOUNTER — Ambulatory Visit (INDEPENDENT_AMBULATORY_CARE_PROVIDER_SITE_OTHER): Payer: BC Managed Care – PPO | Admitting: Internal Medicine

## 2010-05-25 DIAGNOSIS — T887XXA Unspecified adverse effect of drug or medicament, initial encounter: Secondary | ICD-10-CM

## 2010-05-25 DIAGNOSIS — E785 Hyperlipidemia, unspecified: Secondary | ICD-10-CM

## 2010-05-25 DIAGNOSIS — M549 Dorsalgia, unspecified: Secondary | ICD-10-CM

## 2010-05-25 DIAGNOSIS — I10 Essential (primary) hypertension: Secondary | ICD-10-CM

## 2010-05-25 DIAGNOSIS — M109 Gout, unspecified: Secondary | ICD-10-CM

## 2010-05-25 DIAGNOSIS — R972 Elevated prostate specific antigen [PSA]: Secondary | ICD-10-CM

## 2010-05-25 DIAGNOSIS — N259 Disorder resulting from impaired renal tubular function, unspecified: Secondary | ICD-10-CM

## 2010-05-25 LAB — BASIC METABOLIC PANEL
Calcium: 9 mg/dL (ref 8.4–10.5)
Chloride: 110 mEq/L (ref 96–112)
GFR: 58.69 mL/min — ABNORMAL LOW (ref >60.00)
Glucose, Bld: 143 mg/dL — ABNORMAL HIGH (ref 70–99)
Potassium: 4.5 mEq/L (ref 3.5–5.1)
Sodium: 145 mEq/L (ref 135–145)

## 2010-05-25 LAB — HEPATIC FUNCTION PANEL
Alkaline Phosphatase: 91 U/L (ref 39–117)
Bilirubin, Direct: 0.1 mg/dL (ref 0.0–0.3)
Total Bilirubin: 0.4 mg/dL (ref 0.3–1.2)
Total Protein: 6.3 g/dL (ref 6.0–8.3)

## 2010-05-25 LAB — LIPID PANEL
Total CHOL/HDL Ratio: 6
VLDL: 103.6 mg/dL — ABNORMAL HIGH (ref 0.0–40.0)

## 2010-05-25 LAB — LDL CHOLESTEROL, DIRECT: Direct LDL: 121.7 mg/dL

## 2010-05-25 MED ORDER — OXYCODONE-ACETAMINOPHEN 7.5-325 MG PO TABS: 1.0000 | ORAL_TABLET | Freq: Three times a day (TID) | ORAL | Status: DC | PRN

## 2010-05-25 NOTE — Assessment & Plan Note (Signed)
**Note De-Identified [place] Obfuscation** Chronic back pain  Control with 1-2 a day

## 2010-05-25 NOTE — Progress Notes (Signed)
**Note De-identified [facility]bfuscation**  **Note De-Identified [place] [facility]bfuscation** Subjective:    Patient ID: Daniel Brock, male    DOB: 17-Feb-1946, 65 y.o.   MRN: [id number]  HPI  patient is a 65 year old white male who presents for followup of multiple medical problems including hyperlipidemia gout hypertension and chronic pain his current pain is well controlled by the Percocet he takes one to 2 tablets a day has not exceeded his prescription for 60 of 38. He also has artificial knee that causes some problems he is compliant with his Zocor and his lipids appeared to be well controlled he is to monitor has been one year since had a lipid panel he states that his gout has been well-controlled by using cherry juice or cherries states that he has not had to use the colchicine except on rare occasions and his hypertension appears to be well controlled he denies any chest pain shortness of breath PND orthopnea   Review of Systems  Constitutional: Negative for fever and fatigue.  HENT: Negative for hearing loss, congestion, neck pain and postnasal drip.   Eyes: Negative for discharge, redness and visual disturbance.  Respiratory: Negative for cough, shortness of breath and wheezing.   Cardiovascular: Negative for leg swelling.  Gastrointestinal: Negative for abdominal pain, constipation and abdominal distention.  Genitourinary: Negative for urgency and frequency.  Musculoskeletal: Negative for joint swelling and arthralgias.  Skin: Negative for color change and rash.  Neurological: Negative for weakness and light-headedness.  Hematological: Negative for adenopathy.  Psychiatric/Behavioral: Negative for behavioral problems.       Past Medical History  Diagnosis Date  . Chronic kidney disease   . Hyperlipidemia   . Hiatal hernia   . Chronic leg pain   . Chronic foot pain   . Renal insufficiency   . Hypertension   . Allergy   . Gout    Past Surgical History  Procedure Date  . Inguinal hernia repair   . Colonoscopy w/ polypectomy   . Ulnar intrapment release 07/2008     reports that he has never smoked. He does not have any smokeless tobacco history on file. He reports that he does not drink alcohol or use illicit drugs. family history includes Cancer in his father; Hyperlipidemia in his mother; and Hypertension in his mother.     Objective:   Physical Exam  Constitutional: He is oriented to person, place, and time. He appears well-developed and well-nourished.  HENT:  Head: Normocephalic and atraumatic.  Eyes: Conjunctivae are normal. Pupils are equal, round, and reactive to light.  Neck: Normal range of motion. Neck supple.  Cardiovascular: Normal rate and regular rhythm.   Pulmonary/Chest: Effort normal and breath sounds normal.  Abdominal: Soft. Bowel sounds are normal.  Musculoskeletal: He exhibits tenderness.  Neurological: He is alert and oriented to person, place, and time.  Skin: Skin is warm and dry.          Assessment & Plan:   he is also due monitoring of his PSA 4 history of periodic elevations of his PSA responded to antibiotics in the past a PSA total and free will be drawn today in addition to the other blood monitoring for his chronic problems of hypertension hyperlipidemia

## 2010-05-25 NOTE — Assessment & Plan Note (Signed)
**Note De-Identified [place] Obfuscation** monitoring psa.Marland KitchenMarland Kitchen

## 2010-05-25 NOTE — Assessment & Plan Note (Signed)
**Note De-Identified Eugene Obfuscation** monitoring creatinine toay

## 2010-05-25 NOTE — Assessment & Plan Note (Signed)
**Note De-Identified [place] [facility]bfuscation** Due monitoring of the cholesterol  we will order a lipid panel and a liver panel today to monitor his cholesterol

## 2010-05-25 NOTE — Assessment & Plan Note (Addendum)
**Note De-Identified Daniel Brock** Has been using cherry to treat the gout with success

## 2010-05-26 LAB — PSA, TOTAL AND FREE
PSA, Free Pct: 30 % (ref >25)
PSA: 1.35 ng/mL (ref <=4.00)

## 2010-06-09 LAB — URINALYSIS, ROUTINE W REFLEX MICROSCOPIC
Bilirubin Urine: NEGATIVE
Ketones, ur: NEGATIVE mg/dL
Leukocytes, UA: NEGATIVE
Nitrite: NEGATIVE
Protein, ur: NEGATIVE mg/dL
Urobilinogen, UA: 0.2 mg/dL (ref 0.0–1.0)

## 2010-06-09 LAB — CBC
HCT: 44.9 % (ref 39.0–52.0)
MCV: 85.1 fL (ref 78.0–100.0)
RBC: 5.28 MIL/uL (ref 4.22–5.81)
WBC: 4 10*3/uL (ref 4.0–10.5)

## 2010-06-09 LAB — PROTIME-INR: INR: 1.01 (ref 0.00–1.49)

## 2010-06-09 LAB — COMPREHENSIVE METABOLIC PANEL
AST: 16 U/L (ref 0–37)
BUN: 21 mg/dL (ref 6–23)
CO2: 28 mEq/L (ref 19–32)
Chloride: 110 mEq/L (ref 96–112)
Creatinine, Ser: 1.52 mg/dL — ABNORMAL HIGH (ref 0.4–1.5)
GFR calc non Af Amer: 46 mL/min — ABNORMAL LOW (ref >60)
Total Bilirubin: 0.7 mg/dL (ref 0.3–1.2)

## 2010-06-09 LAB — APTT: aPTT: 29 seconds (ref 24–37)

## 2010-06-09 LAB — ABO/RH: ABO/RH(D): O POS

## 2010-06-22 LAB — POCT I-STAT 4, (NA,K, GLUC, HGB,HCT)
Glucose, Bld: 114 mg/dL — ABNORMAL HIGH (ref 70–99)
HCT: 43 % (ref 39.0–52.0)

## 2010-06-29 LAB — DIFFERENTIAL
Basophils Absolute: 0 10*3/uL (ref 0.0–0.1)
Lymphocytes Relative: 23 % (ref 12–46)
Monocytes Absolute: 0.5 10*3/uL (ref 0.1–1.0)
Neutro Abs: 3.8 10*3/uL (ref 1.7–7.7)
Neutrophils Relative %: 67 % (ref 43–77)

## 2010-06-29 LAB — BASIC METABOLIC PANEL
Calcium: 9.3 mg/dL (ref 8.4–10.5)
Creatinine, Ser: 1.54 mg/dL — ABNORMAL HIGH (ref 0.4–1.5)
GFR calc non Af Amer: 46 mL/min — ABNORMAL LOW (ref >60)
Glucose, Bld: 92 mg/dL (ref 70–99)
Sodium: 141 mEq/L (ref 135–145)

## 2010-06-29 LAB — PROTIME-INR
INR: 1 (ref 0.00–1.49)
Prothrombin Time: 13 seconds (ref 11.6–15.2)

## 2010-06-29 LAB — APTT: aPTT: 29 seconds (ref 24–37)

## 2010-06-29 LAB — CBC
Hemoglobin: 15.3 g/dL (ref 13.0–17.0)
Platelets: 127 10*3/uL — ABNORMAL LOW (ref 150–400)
RDW: 14.3 % (ref 11.5–15.5)

## 2010-08-03 NOTE — Op Note (Signed)
**Note De-Identified [place] [facility]uscation** NAME:  Daniel Brock, Daniel Brock                     ACCOUNT NO.:  [id number]   MEDICAL RECORD NO.:  [id number]          PATIENT TYPE:  AMB   LOCATION:                               FACILITY:  [facility]   PHYSICIAN:  Tia Alert, MD     DATE OF BIRTH:  [birth year]   DATE OF PROCEDURE:  07/23/2008  DATE OF DISCHARGE:                               OPERATIVE REPORT   PREOPERATIVE DIAGNOSIS:  Left ulnar neuropathy.   POSTOPERATIVE DIAGNOSIS:  Left ulnar neuropathy.   PROCEDURE:  Left ulnar nerve decompression.   SURGEON:  Tia Alert, MD   ANESTHESIA:  General endotracheal.   COMPLICATIONS:  None apparent.   INDICATIONS FOR PROCEDURE:  Daniel Brock is a 65 year old gentleman who  presented to Dr. Gerlene Fee with numbness and tingling in the left fourth  and fifth digits with weakness in the hand.  He was found to have nerve  conduction studies with significant left ulnar neuropathy at the elbow.  Dr. Gerlene Fee asked me to see him regarding left ulnar nerve  decompression.  The patient was found to have weakness in the left hand  with numbness in the fourth and fifth digits.  I recommended left ulnar  nerve decompression without transposition.  He understood the risks of  the surgery including bleeding, infection, nerve injury, numbness,  weakness, pain, lack of relief of symptoms, worsening symptoms, and  anesthesia risk, and he agreed to proceed.   DESCRIPTION OF PROCEDURE:  The patient was taken to the operating room,  and after induction of adequate general endotracheal anesthesia his left  arm was extended on an arm board.  It was prepped circumferentially from  the fingertips to the axilla with DuraPrep and then draped in the usual  sterile fashion.  Local anesthesia 5 mL was injected, and a curvilinear  incision was made over the left medial epicondyle at the elbow and  carried down through the subcutaneous tissue.  Self-retaining retractors  were placed.  I was able to palpate the medial epicondyle.   I then  worked just inferior to the medial epicondyle.  He had significant scar  tissue.  There was fatty tissue.  It seemed extremely scarred and tight  over the cubital tunnel.  I had difficulty finding the nerve within the  cubital tunnel and therefore moved more proximally and was able to find  the nerve more superficially as it exited the medial heads of the  triceps.  It appeared to be swollen and erythematous.  We used nerve  stimulation throughout the procedure to help identify the location of  the nerve.  The nerve appeared to not stimulate very well proximally and  it stimulated better distally.  I was able to dissect from proximal to  distal.  Once the nerve was identified proximal to the cubital tunnel, I  dissected down through the cubital tunnel where it was significantly  scarred in with tight overgrown bands of tissue.  These were dissected  free by spreading between the nerve and the tissue with a hemostat and  then transecting **Note De-Identified Rossano Obfuscation** this sharply distally until I was at the aponeurosis of  the flexor carpi ulnaris which was divided sharply.  Once this was done,  I again stimulated along the nerve, and we got stimulation from proximal  to distal though it was somewhat stronger distally.  The nerve looked  better once we had it decompressed, and I was able to remove all of the  layers around the perineurium and identified the perineurium.  The nerve  was free.  I did not circumferentially dissect the nerve in order to  preserve its blood supply.  I did flex and extend the elbow to assure no  subluxation of the nerve.  I then irrigated with saline solution.  I  dried all bleeding points with bipolar cautery.  I then closed the  subcutaneous tissues with 2-0 Vicryl, closed the subcuticular tissue  with 3-0 Vicryl, closed the skin with Benzoin  and Steri-Strips.  The drapes were removed.  A sterile dressing was  applied.  The patient was awakened from general anesthesia and   transferred to recovery room in stable condition.  At the end of the  procedure, all sponge, needle, and instrument counts were correct.      Tia Alert, MD  Electronically Signed     DSJ/MEDQ  D:  07/23/2008  T:  07/24/2008  Job:  [id number]

## 2010-08-03 NOTE — Op Note (Signed)
**Note De-Identified Smart Obfuscation** NAME:  Daniel Brock, Daniel Brock                     ACCOUNT NO.:  [id number]   MEDICAL RECORD NO.:  [id number]          PATIENT TYPE:  AMB   LOCATION:  [facility]                         FACILITY:  [facility]   PHYSICIAN:  Maretta Bees. Vonita Moss, M.D.DATE OF BIRTH:  1945-05-25   DATE OF PROCEDURE:  10/17/2007  DATE OF DISCHARGE:                               OPERATIVE REPORT   PREOPERATIVE DIAGNOSIS:  Symptomatic right spermatocele.   POSTOPERATIVE DIAGNOSES:  1. Symptomatic right spermatocele.  2. Small right hydrocele.   PROCEDURES:  Right spermatocelectomy and hydrocelectomy.   SURGEON:  Maretta Bees. Vonita Moss, M.D.   ANESTHESIA:  General.   INDICATIONS:  This gentleman has had some scrotal discomfort from a  clinically palpable right spermatocele.  He requested surgical  intervention.  He was he was advised about bruising, swelling and  infection and pain.   PROCEDURE IN DETAIL:  The patient is brought to the operating room and  placed in supine position.  The lower abdomen and external genitalia  prepped and draped in the usual fashion.  A vertical right scrotal  incision was made and a small hydrocele encountered and was opened up  and drained and then the testicle delivered in the operative field,  where an obvious spermatocele the size of the testicle was identified.  The spermatocele was dissected out and then drained, and spermatocele  sac resected.  Also there was fulguration completed on an appendix  epididymis.  The wound was irrigated with sterile water.  The residual  small mucosal base of the spermatocele was fulgurated with the  electrocautery to prevent recurrence.  The hydrocele sac was sewn behind  the testicle with running 3-0 chromic catgut to prevent recurrence.  Testicle was placed back in the scrotum.  Marcaine was used for postop  analgesia and anesthesia.  The scrotal incision was closed with a deep  layer of interrupted 3-0 chromic catgut and a skin layer of running 3-0  chromic  catgut.  The wound was then cleaned, dressed with dry sterile  gauze dressings.  Sponge, needle  and instrument count was correct.  Blood loss was essentially 0.  The  wound was dressed with Neosporin, dry sterile gauze was in the scrotal  support.  He was taken to recovery room in good condition, having  tolerated the procedure well      Maretta Bees. Vonita Moss, M.D.  Electronically Signed     LJP/MEDQ  D:  10/17/2007  T:  10/17/2007  Job:  [id number]   cc:   Stacie Glaze, MD  [address] [place]  [place] [address]

## 2010-08-06 NOTE — Discharge Summary (Signed)
**Note De-Identified [place] [facility]bfuscation** NAME:  Daniel Brock, Daniel Brock                               ACCOUNT NO.:  [id number]   MEDICAL RECORD NO.:  [id number]                   PATIENT TYPE:  INP   LOCATION:  0474                                 FACILITY:  [facility]   PHYSICIAN:  Ollen Gross, M.D.                 DATE OF BIRTH:  05-Sep-1945   DATE OF ADMISSION:  02/18/2002  DATE OF DISCHARGE:  02/22/2002                                 DISCHARGE SUMMARY   ADMITTING DIAGNOSES:  1. Osteoarthritis right knee.  2. Hiatal hernia.  3. Occasional reflux disease.   DISCHARGE DIAGNOSES:  1. Osteoarthritis right knee status post right total knee replacement     arthroplasty.  2. Hiatal hernia.  3. Occasional reflux disease.   PROCEDURE:  The patient was taken to the OR on February 18, 2002.  Underwent  a right total knee replacement arthroplasty.  Surgeon Ollen Gross, M.D.  Assistant Alexzandrew L. Julien Girt, P.A.  Surgery under spinal anesthesia.  Minimal blood loss.  Hemovac drain x1.  Tourniquet time 62 minutes at 300  mmHg.   CONSULTS:  None.   BRIEF HISTORY:  The patient is a 65 year old male who has been seen by Ollen Gross, M.D. for ongoing right knee pain.  He was referred over by Caralyn Guile. Ethelene Hal, M.D.  He has been found to have progressive type pain in the knee  with difficulty weightbearing.  He has had some swelling, popping, and  clicking.  He has undergone injections which only helped for a short limited  amount of time.  He was seen in the office where x-rays showed bone on bone  contact.  It was felt he has reached a point where he could benefit from  undergoing total knee replacement.  Risks and benefits discussed.  The  patient is subsequently admitted to the hospital.   LABORATORY DATA:  CBC on admission showed hemoglobin of 14.1, hematocrit of  41.2, white cell count 3.9, red cell count 4.85.  Differential within normal  limits.  Postoperative H&H 12.9 and 38.1.  Last noted H&H 10.6 and 30.7.  PT/PTT on admission  were 12.7 and 29, respectively.  INR 0.9.  Serial pro  times followed per Coumadin protocol.  Last noted PT/INR 22.2 and 2.2.  Chemistry panel on admission all within normal limits.  Follow-up BMET:  Electrolytes within normal limits.  Follow-up BMET only showed a glucose  increased from 110 to 144, calcium from 9.0 dropped down to 8.3.  Urinalysis  on admission only showed trace hemoglobin, otherwise negative.  Follow-up UA  on February 20, 2002 showed large blood, otherwise negative.  Blood group  type O+.  Urine culture taken on February 20, 2002 no growth.   EKG dated February 11, 2002:  Normal sinus rhythm, left axis deviation  confirmed by W. Viann Fish, M.D.  Chest x-ray dated February 11, 2002:  Probable **Note De-Identified Edwards Obfuscation** negative chest for active disease with pulmonary densities noted at  base.   HOSPITAL COURSE:  The patient was admitted to [facility] and taken  to the OR.  Underwent above stated procedure.  The patient tolerated  procedure well.  He was given 24 hours of postoperative IV antibiotics,  placed on Coumadin protocol for DVT prophylaxis.  The patient started on PCA  Dilaudid.  PT and OT were consulted to assist with gait training,  ambulation, and total knee protocol.  The patient had fair amount of pain on  first night of surgery.  However, was assisted out of bed with therapy.  On  postoperative day two pain started to improve; however, he did develop a  little bit of nausea on postoperative day one which was slightly improved.  He did have an increase in temperature to 101.0.  UA was checked which did  prove to be negative.  He is encouraged to use his incentive spirometer.  He  did have some dysuria also noted on day two where he was found to have  hematuria.  Otherwise, urine was negative.  PCA and IV and Foley were  discontinued also on this day.  Dressing change initiated.  Incision was  healing well.  He progressed very well with physical therapy.  Discharge   planning was consulted to assist with his hospital care.  By postoperative  day four patient was up ambulating very well with physical therapy and when  weaned over to p.o. analgesics.  He was doing well and was discharged home.   DISCHARGE PLAN:  The patient is discharged home on February 22, 2002.   DISCHARGE DIAGNOSES:  Please see above.   DISCHARGE MEDICATIONS:  1. Percocet p.r.n. pain.  2. Robaxin p.r.n. spasm.  3. Phenergan p.r.n. nausea.  4. Coumadin as per pharmacy protocol.  5. He is also given a prescription for Colace.   DIET:  As tolerated.   ACTIVITY:  Weightbearing as tolerated to the right lower extremity.  Total  knee protocol.  May start showering.  Home health PT and home health nursing  through [facility].   FOLLOW UP:  The patient will follow up in the office.  Call the office for  appointment [phone].   DISPOSITION:  Home.  The patient is set up for a CPM to use at home for  range of motion.   CONDITION ON DISCHARGE:  Improved.     Alexzandrew L. Julien Girt, P.A.              Ollen Gross, M.D.    ALP/MEDQ  D:  04/02/2002  T:  04/02/2002  Job:  [id number]   cc:   Ollen Gross, M.D.  [address]  [place]  [place] [address]  Fax: [phone]   Stacie Glaze, M.D. [facility]

## 2010-08-06 NOTE — Op Note (Signed)
**Note De-Identified [place] Obfuscation** NAME:  Daniel Brock                               ACCOUNT NO.:  [id number]   MEDICAL RECORD NO.:  [id number]                   PATIENT TYPE:  INP   LOCATION:  0001                                 FACILITY[facility]   PHYSICIAN:  Ollen Gross, M.D.                 DATE OF BIRTH:  1945-06-12   DATE OF PROCEDURE:  02/18/2002  DATE OF DISCHARGE:                                 OPERATIVE REPORT   PREOPERATIVE DIAGNOSIS:  Osteoarthritis, right knee.   POSTOPERATIVE DIAGNOSIS:  Osteoarthritis, right knee.   PROCEDURE:  Right total knee arthroplasty.   SURGEON:  Ollen Gross, M.D.   ASSISTANT:  Avel Peace, P.A.-C.   ANESTHESIA:  Spinal.   ESTIMATED BLOOD LOSS:  Minimal.   DRAINS:  Hemovac x1.   COMPLICATIONS:  None.   TOURNIQUET TIME:  62 minutes at 300 mmHg.   CONDITION:  Stable to recovery.   BRIEF CLINICAL NOTE:  Daniel Brock is a 65 year old male with severe end-stage  osteoarthritis of the right knee with pain refractory to nonoperative  management. He presents now for right total knee arthroplasty.   DESCRIPTION OF PROCEDURE:  After successful administration of spinal  anesthetic, a tourniquet was placed high on the right thigh and right lower  extremity prepped and draped in the usual sterile fashion. Extremity wrapped  in Esmarch, knee flexed, tourniquet inflated to 300 mmHg. A standard midline  incision was made with a 10 blade through the subcutaneous tissue to the  level of the extensor mechanism. A fresh blade was used to make a medial  parapatellar arthrotomy and then the soft tissue over the proximal medial  tibia subperiosteally elevated to the joint line with a knife and then to  the semimembranosus bursa with a curved osteotome. The soft tissue over the  proximal lateral tibia was also elevated with attention being paid to  avoiding the patellar tendon on the tibial tubercle. The lateral  patellofemoral ligament is cut, patella everted, knee flexed to 90  degrees.  The ACL and PCL were then removed.   A drill was used to create a starting hole in the distal femur and the canal  was irrigated. A 5 degree right valgus alignment guide is placed referencing  off the posterior condyle, the block is pinned to remove 10 mm off the  distal femur. Distal femoral resection is made with an oscillating saw. A  sizing block is placed and a size 5 is most appropriate. The rotation is  marked off the posterior condyles and that rotation corresponds with the  epicondylar axis. The size 5 cutting block is pinned and the anterior and  posterior cuts made.   The tibia is subluxed forward and the menisci removed. Extramedullary tibial  alignment guide is placed referencing proximally at the medial aspect of the  tibial tubercle and distally along the second metatarsal axis and tibial **Note De-Identified Siegrist Obfuscation** crest. The block is pinned to remove 10 mm off the nondeficient lateral  side. Tibial resection is made with an oscillating saw. A size 5 is the most  appropriate and the size 5 mobile bearing tray placed and the proximal tibia  is prepared with the modular drill and keel punch. The femoral preparation  is then completed with the intercondylar and chamfer blocks and those cuts  subsequently made. A size 5 posterior stabilized femoral trial is placed  with the size 5 mobile bearing tibial trial. First a 10 mm posterior  stabilized insert is placed and there was still varus and valgus play with  a10 as well as 12.5. A 15 mm insert he had full extension with excellent  varus and valgus balance throughout full range of motion. The patella was  then addressed and thickness measured to be 25 mm. Free hand resection was  taken down to 14 mm and a 41 template placed. Lug holes were drilled, trial  patella placed and tracks normally. The trials were removed with the  exception of the femoral trial. Osteophytes were removed off the posterior  femur with the femoral trial in place. The  cut bone surfaces were then  prepared with pulsatile lavage after the femoral trial was removed. Cement  mixed and once ready for implantation the size 5 mobile bearing tibial tray,  size 5 posterior stabilized femoral component, the 41 patella were all  cemented into place, patella was held with the clamp. Trial 15 mm insert is  placed, knee held in full extension, all extruded cement removed. Once the  cement is fully hardened then the permanent 15 mm posterior stabilized  rotating platform insert is placed. The wound was then copiously irrigated  with antibiotic solution and extensor mechanism closed over a Hemovac drain  with interrupted #1 PDS. Flexed against gravity was at 125-130 degrees. The  subcu was closed with interrupted 2-0 Vicryl. After the extensor mechanism  was closed, the tourniquet was released with a total time of 62 minutes,  then minor bleeding stopped and then the subcu closed with interrupted 2-0  Vicryl. The subcuticular was closed with running 4-0 Monocryl. The incision  was clean and dry and Steri-Strips and a bulky sterile dressing applied. The  patient was then placed into a knee immobilizer, awakened and transported to  recovery in stable condition.                                               Ollen Gross, M.D.    FA/MEDQ  D:  02/18/2002  T:  02/18/2002  Job:  [id number]

## 2010-08-06 NOTE — H&P (Signed)
**Note De-Identified Lory Obfuscation** NAME:  Daniel Brock, Daniel Brock                               ACCOUNT NO.:  [id number]   MEDICAL RECORD NO.:  [id number]                   PATIENT TYPE:  INP   LOCATION:  NA                                   FACILITY:  [facility]   PHYSICIAN:  Ollen Gross, M.D.                 DATE OF BIRTH:  1946-02-14   DATE OF ADMISSION:  02/18/2002  DATE OF DISCHARGE:                                HISTORY & PHYSICAL   CHIEF COMPLAINT:  Right knee pain.   HISTORY OF PRESENT ILLNESS:  The patient is a 65 year old male who has been  seen and evaluated by Dr. Ollen Gross for right knee pain.  He was  referred over by Dr. Ethelene Hal.  Daniel Brock injured his right knee back in 1996 at  work.  He had previously undergone knee arthroscopy by Dr. Madelon Lips but never  really got completely better following his knee surgery.  He did settle with  his Workman's Compensation claim.  He now has persistent right knee pain  with difficulty weightbearing.  He also has some swelling, popping,  clicking, locking, and buckling.  He has difficulty with weightbearing,  squatting, and stairs.  He has undergone injections with his knee, which  only helped for about two days.  The pain is unrelenting now and had  continued to increase.  He was seen in the office where x-rays showed bone-  on-bone contract with grooving of the bone and subchondral sclerosis and end-  stage osteoarthritic changes.  It is felt he would benefit from undergoing a  right total knee replacement arthroplasty.  The patient in conjunction by  Dr. Lequita Halt.  Risks and benefits have been discussed with the patient.  He  has elected to proceed with surgery.   ALLERGIES:  No known drug allergies.   INTOLERANCES:  Codeine makes the patient sick.  Vicodin makes the patient  sick.  He has been able to tolerate Tylox.   PAST MEDICAL HISTORY:  Hiatal hernia with occasional reflux.   PAST SURGICAL HISTORY:  Right knee arthroscopy, hernia repair, vein  stripping.   SOCIAL  HISTORY:  He is married and has one daughter.  Denies the use of  alcohol products or tobacco products.  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   FAMILY HISTORY:  Father deceased at age 59 with a history of pancreatic  cancer.  Mother living at age 62 with a history of bypass surgery.   REVIEW OF SYSTEMS:  GENERAL:  No fevers, chills, night sweats.  NEUROLOGIC:  No seizures or paralysis.  RESPIRATORY:  No shortness of breath, productive  cough, or hemoptysis.  CARDIOVASCULAR:  No chest pain, angina, orthopnea.  GI:  He does have some occasional reflux.  No nausea, vomiting, diarrhea,  constipation.  GU:  No dysuria or hematuria.  MUSCULOSKELETAL:  Pertinent  for that of the right **Note De-Identified Krzyzanowski Obfuscation** knee found in the history of present illness.   PHYSICAL EXAMINATION:  VITAL SIGNS:  Pulse 68, respirations 18, blood  pressure 132/88.  GENERAL:  The patient is a 65 year old white male, well-nourished, well-  developed, in no acute distress.  He is alert, oriented, and cooperative.  He is accompanied by his wife.  HEENT:  Normocephalic and atraumatic.  Pupils round and reactive.  EOMs are  intact.  Oropharynx is clear.  He does have partial lower dentures noted.  NECK:  Supple.  No carotid bruits are appreciated.  CHEST:  Clear to auscultation anterior and posterior chest wall.  There is  no rhonchi, rales, or wheezing.  HEART:  Regular rate and rhythm.  No murmur.  ABDOMEN:  Soft, nontender.  Bowel sounds are present.  RECTAL/GENITALIA:  Not done.  Not pertinent to present illness.  EXTREMITIES:  Confined to the right lower extremity.  He has range of motion  of 10 to 95 degrees and marked crepitus and swelling noted.  He does  ambulate with an antalgic gait.   IMPRESSION:  1. Osteoarthritis, right knee.  2. Hiatal hernia.  3. Occasional reflux.   PLAN:  The patient will be admitted to [facility].  Will undergo  right total knee replacement arthroplasty.  The patient's medical physician   is Dr. Darryll Capers.  Dr. Lovell Sheehan will be notified of the room number and  admission, and will be consulted if needed for any medical assistance with  this patient throughout the hospital course.     Daniel Brock, P.A.              Ollen Gross, M.D.    ALP/MEDQ  D:  02/17/2002  T:  02/17/2002  Job:  [id number]   cc:   Stacie Glaze, M.D. [facility]   Ollen Gross, M.D.  [address]  [place]  [place] [address]  Fax: [phone]

## 2010-08-25 ENCOUNTER — Ambulatory Visit (INDEPENDENT_AMBULATORY_CARE_PROVIDER_SITE_OTHER): Payer: BC Managed Care – PPO | Admitting: Internal Medicine

## 2010-08-25 ENCOUNTER — Encounter: Payer: Self-pay | Admitting: Internal Medicine

## 2010-08-25 ENCOUNTER — Other Ambulatory Visit: Payer: Self-pay | Admitting: *Deleted

## 2010-08-25 DIAGNOSIS — N259 Disorder resulting from impaired renal tubular function, unspecified: Secondary | ICD-10-CM

## 2010-08-25 DIAGNOSIS — I10 Essential (primary) hypertension: Secondary | ICD-10-CM

## 2010-08-25 DIAGNOSIS — E785 Hyperlipidemia, unspecified: Secondary | ICD-10-CM

## 2010-08-25 DIAGNOSIS — M109 Gout, unspecified: Secondary | ICD-10-CM

## 2010-08-25 DIAGNOSIS — M549 Dorsalgia, unspecified: Secondary | ICD-10-CM

## 2010-08-25 MED ORDER — FENOFIBRATE 145 MG PO TABS: 145.0000 mg | ORAL_TABLET | Freq: Every day | ORAL | Status: DC

## 2010-08-25 MED ORDER — OXYCODONE-ACETAMINOPHEN 7.5-325 MG PO TABS: 1.0000 | ORAL_TABLET | Freq: Three times a day (TID) | ORAL | Status: DC | PRN

## 2010-08-25 MED ORDER — OLMESARTAN MEDOXOMIL 20 MG PO TABS: 20.0000 mg | ORAL_TABLET | Freq: Every day | ORAL | Status: DC

## 2010-08-25 MED ORDER — TADALAFIL 5 MG PO TABS: 5.0000 mg | ORAL_TABLET | Freq: Every day | ORAL | Status: DC | PRN

## 2010-08-25 NOTE — Progress Notes (Signed)
**Note De-identified Mateus Obfuscation**  **Note De-Identified Cleary Obfuscation** Subjective:    Patient ID: Daniel Brock, male    DOB: Mar 24, 1945, 65 y.o.   MRN: [id number]  HPI  Patient is a 65 year old white male with history of hypertension hyperlipidemia gouty arthritis and  triglyceridemia.  He has not been able to tolerate statins.  He has severe myalgias associated with the use of the drug but discontinued when he ceases the drug.  His blood pressure has been better controlled since he stopped drinking diet drinks with carbonation and iced tea. He had monitoring blood work at the last office visit which showed a borderline uric acid un controlled cholesterol with elevated triglycerides.    Review of Systems  Constitutional: Negative for fever and fatigue.  HENT: Negative for hearing loss, congestion, neck pain and postnasal drip.   Eyes: Negative for discharge, redness and visual disturbance.  Respiratory: Negative for cough, shortness of breath and wheezing.   Cardiovascular: Negative for leg swelling.  Gastrointestinal: Negative for abdominal pain, constipation and abdominal distention.  Genitourinary: Negative for urgency and frequency.  Musculoskeletal: Negative for joint swelling and arthralgias.  Skin: Negative for color change and rash.  Neurological: Negative for weakness and light-headedness.  Hematological: Negative for adenopathy.  Psychiatric/Behavioral: Negative for behavioral problems.   Past Medical History  Diagnosis Date  . Chronic kidney disease   . Hyperlipidemia   . Hiatal hernia   . Chronic leg pain   . Chronic foot pain   . Renal insufficiency   . Hypertension   . Allergy   . Gout    Past Surgical History  Procedure Date  . Inguinal hernia repair   . Colonoscopy w/ polypectomy   . Ulnar intrapment release 07/2008    reports that he has never smoked. He does not have any smokeless tobacco history on file. He reports that he does not drink alcohol or use illicit drugs. family history includes Cancer in his father;  Hyperlipidemia in his mother; and Hypertension in his mother. Allergies  Allergen Reactions  . Ciprofloxacin Hcl     REACTION: NAUSIA AND VOMITING  . Sulfamethoxazole W/Trimethoprim     REACTION: nausea and vomiting  . Sulfonamide Derivatives     REACTION: itching       Objective:   Physical Exam  Constitutional: He appears well-developed and well-nourished.       Obese  HENT:  Head: Normocephalic and atraumatic.  Eyes: Conjunctivae are normal. Pupils are equal, round, and reactive to light.  Neck: Normal range of motion. Neck supple.  Cardiovascular: Normal rate and regular rhythm.   Pulmonary/Chest: Effort normal and breath sounds normal.  Abdominal: Soft. Bowel sounds are normal.          Assessment & Plan:  We will change his cholesterol medications TriCor since he is unable to tolerate statins.  We have urged him to continue his diet modification limited dated iced tea and sodas his blood pressure has responded to this we believe he needs to stay on the Benicar due to his diabetes (diet controlled) His blood pressure today was normal but he states that readings over the past month off the medications have been as high as 150 systolic. We have changed his Cialis to 5 mg daily

## 2010-08-25 NOTE — Patient Instructions (Signed)
**Note De-Identified Cacciola Obfuscation** Keep of the carbonated drinks both diet and regular Try de- caffeinated tea We will continue to monitor off the blood pressure medicines

## 2010-08-25 NOTE — Assessment & Plan Note (Signed)
**Note De-Identified Trumpower Obfuscation** Since the patient cannot tolerate statin we will try a med        tricor

## 2010-11-11 ENCOUNTER — Telehealth: Payer: Self-pay | Admitting: Internal Medicine

## 2010-11-11 MED ORDER — INDOMETHACIN 25 MG PO CAPS: 25.0000 mg | ORAL_CAPSULE | Freq: Three times a day (TID) | ORAL | Status: AC

## 2010-11-11 MED ORDER — COLCHICINE 0.6 MG PO TABS: 0.6000 mg | ORAL_TABLET | Freq: Two times a day (BID) | ORAL | Status: DC

## 2010-11-11 NOTE — Telephone Encounter (Signed)
**Note De-identified Walk[place]iak Obfuscation** ok 

## 2010-11-11 NOTE — Telephone Encounter (Signed)
**Note De-identified Cayabyab Obfuscation** Left message on machine For pt 

## 2010-11-11 NOTE — Telephone Encounter (Signed)
**Note De-Identified Hommes Obfuscation** Has frequent gout- dr Lovell Sheehan usually orders colcrys bid and indocin 25 tid.  May have an order?

## 2010-11-11 NOTE — Telephone Encounter (Signed)
**Note De-Identified Tat Obfuscation** Pt gout in L foot is flaring up and was wanted to know if possible he could have a prescription sent in.  [facility] phone [phone]

## 2010-12-02 ENCOUNTER — Encounter: Payer: Self-pay | Admitting: Internal Medicine

## 2010-12-02 ENCOUNTER — Ambulatory Visit (INDEPENDENT_AMBULATORY_CARE_PROVIDER_SITE_OTHER): Payer: BC Managed Care – PPO | Admitting: Internal Medicine

## 2010-12-02 VITALS — BP 140/80 | HR 68 | Temp 98.2°F | Resp 14 | Ht 72.0 in | Wt 244.0 lb

## 2010-12-02 DIAGNOSIS — N259 Disorder resulting from impaired renal tubular function, unspecified: Secondary | ICD-10-CM

## 2010-12-02 DIAGNOSIS — M109 Gout, unspecified: Secondary | ICD-10-CM

## 2010-12-02 DIAGNOSIS — M549 Dorsalgia, unspecified: Secondary | ICD-10-CM

## 2010-12-02 DIAGNOSIS — H3530 Unspecified macular degeneration: Secondary | ICD-10-CM

## 2010-12-02 DIAGNOSIS — E785 Hyperlipidemia, unspecified: Secondary | ICD-10-CM

## 2010-12-02 DIAGNOSIS — I10 Essential (primary) hypertension: Secondary | ICD-10-CM

## 2010-12-02 LAB — BASIC METABOLIC PANEL
BUN: 28 mg/dL — ABNORMAL HIGH (ref 6–23)
Calcium: 8.8 mg/dL (ref 8.4–10.5)
Creatinine, Ser: 1.3 mg/dL (ref 0.4–1.5)

## 2010-12-02 LAB — LIPID PANEL
Cholesterol: 209 mg/dL — ABNORMAL HIGH (ref 0–200)
Triglycerides: 244 mg/dL — ABNORMAL HIGH (ref 0.0–149.0)

## 2010-12-02 LAB — URIC ACID: Uric Acid, Serum: 7.7 mg/dL (ref 4.0–7.8)

## 2010-12-02 MED ORDER — OXYCODONE-ACETAMINOPHEN 7.5-325 MG PO TABS: 1.0000 | ORAL_TABLET | Freq: Three times a day (TID) | ORAL | Status: DC | PRN

## 2010-12-02 MED ORDER — TOZAL PO CAPS: 1.0000 | ORAL_CAPSULE | Freq: Three times a day (TID) | ORAL | Status: DC

## 2010-12-02 NOTE — Progress Notes (Signed)
**Note De-identified Maya Obfuscation**  **Note De-Identified Severa Obfuscation** Subjective:    Patient ID: Daniel Brock, male    DOB: 02/12/1946, 65 y.o.   MRN: [id number]  HPI Patient presents for followup of hyperlipidemia chronic renal insufficiency and hypertension.  He also is followed for gout. Patient flare of gout and Copegus was sent we should measure a uric acid today to monitor whether or not he needs to be on a prophylactic medication for gout.  He also is recovering from upper respiratory tract infection is probably viral in etiology   Review of Systems  Constitutional: Negative for fever and fatigue.  HENT: Positive for congestion, rhinorrhea and postnasal drip. Negative for hearing loss and neck pain.   Eyes: Negative for discharge, redness and visual disturbance.  Respiratory: Negative for cough, shortness of breath and wheezing.   Cardiovascular: Negative for leg swelling.  Gastrointestinal: Negative for abdominal pain, constipation and abdominal distention.  Genitourinary: Negative for urgency and frequency.  Musculoskeletal: Negative for joint swelling and arthralgias.  Skin: Negative for color change and rash.  Neurological: Negative for weakness and light-headedness.  Hematological: Negative for adenopathy.  Psychiatric/Behavioral: Negative for behavioral problems.   Past Medical History  Diagnosis Date  . Chronic kidney disease   . Hyperlipidemia   . Hiatal hernia   . Chronic leg pain   . Chronic foot pain   . Renal insufficiency   . Hypertension   . Allergy   . Gout    Past Surgical History  Procedure Date  . Inguinal hernia repair   . Colonoscopy w/ polypectomy   . Ulnar intrapment release 07/2008    reports that he has never smoked. He does not have any smokeless tobacco history on file. He reports that he does not drink alcohol or use illicit drugs. family history includes Cancer in his father; Hyperlipidemia in his mother; and Hypertension in his mother. Allergies  Allergen Reactions  . Ciprofloxacin Hcl     REACTION:  NAUSIA AND VOMITING  . Sulfamethoxazole W/Trimethoprim     REACTION: nausea and vomiting  . Sulfonamide Derivatives     REACTION: itching       Objective:   Physical Exam  Nursing note and vitals reviewed. Constitutional: He appears well-developed and well-nourished.  HENT:  Head: Normocephalic and atraumatic.  Eyes: Conjunctivae are normal. Pupils are equal, round, and reactive to light.  Neck: Normal range of motion. Neck supple.  Cardiovascular: Normal rate and regular rhythm.   Pulmonary/Chest: Effort normal and breath sounds normal.  Abdominal: Soft. Bowel sounds are normal.          Assessment & Plan:  monitor the uric acid and renal function Blood pressure stable Monitor lipids Consider use of allopurinol vs uloric

## 2010-12-17 LAB — POCT I-STAT 4, (NA,K, GLUC, HGB,HCT)
Glucose, Bld: 104 — ABNORMAL HIGH
HCT: 46
Potassium: 4

## 2011-02-28 ENCOUNTER — Ambulatory Visit (INDEPENDENT_AMBULATORY_CARE_PROVIDER_SITE_OTHER): Payer: BC Managed Care – PPO | Admitting: Internal Medicine

## 2011-02-28 VITALS — BP 150/90 | HR 72 | Temp 98.2°F | Resp 16 | Ht 71.0 in | Wt 251.0 lb

## 2011-02-28 DIAGNOSIS — M549 Dorsalgia, unspecified: Secondary | ICD-10-CM

## 2011-02-28 DIAGNOSIS — I10 Essential (primary) hypertension: Secondary | ICD-10-CM

## 2011-02-28 DIAGNOSIS — N529 Male erectile dysfunction, unspecified: Secondary | ICD-10-CM

## 2011-02-28 MED ORDER — OLMESARTAN MEDOXOMIL 40 MG PO TABS: 40.0000 mg | ORAL_TABLET | Freq: Every day | ORAL | Status: DC

## 2011-02-28 MED ORDER — OXYCODONE-ACETAMINOPHEN 7.5-325 MG PO TABS: 1.0000 | ORAL_TABLET | Freq: Three times a day (TID) | ORAL | Status: DC | PRN

## 2011-02-28 MED ORDER — SILDENAFIL CITRATE 100 MG PO TABS: 100.0000 mg | ORAL_TABLET | ORAL | Status: DC | PRN

## 2011-03-04 ENCOUNTER — Encounter: Payer: Self-pay | Admitting: Internal Medicine

## 2011-03-04 NOTE — Patient Instructions (Signed)
**Note De-identified Holster Obfuscation** The patient is instructed to continue all medications as prescribed. Schedule followup with check out clerk upon leaving the clinic  

## 2011-03-04 NOTE — Progress Notes (Signed)
**Note De-Identified Icenogle Obfuscation** Subjective:    Patient ID: Daniel Brock, male    DOB: 10-Feb-1946, 65 y.o.   MRN: [id number]  HPI Includes a 65 year old white male who presents for followup of hypertension hyperlipidemia and erectile dysfunction.  He states that he has been taking his medications as directed Laboratory work was drawn prior to his office visit to include a uric acid level a lipid panel and a LDL he denies any shortness of breath or chest pains.  He has no recent gouty flares  Review of Systems  Constitutional: Negative for fever and fatigue.  HENT: Negative for hearing loss, congestion, neck pain and postnasal drip.   Eyes: Negative for discharge, redness and visual disturbance.  Respiratory: Negative for cough, shortness of breath and wheezing.   Cardiovascular: Negative for leg swelling.  Gastrointestinal: Negative for abdominal pain, constipation and abdominal distention.  Genitourinary: Negative for urgency and frequency.  Musculoskeletal: Negative for joint swelling and arthralgias.  Skin: Negative for color change and rash.  Neurological: Negative for weakness and light-headedness.  Hematological: Negative for adenopathy.  Psychiatric/Behavioral: Negative for behavioral problems.       Past Medical History  Diagnosis Date  . Chronic kidney disease   . Hyperlipidemia   . Hiatal hernia   . Chronic leg pain   . Chronic foot pain   . Renal insufficiency   . Hypertension   . Allergy   . Gout     History   Social History  . Marital Status: Married    Spouse Name: N/A    Number of Children: N/A  . Years of Education: N/A   Occupational History  . Not on file.   Social History Main Topics  . Smoking status: Never Smoker   . Smokeless tobacco: Not on file  . Alcohol Use: No  . Drug Use: No  . Sexually Active: Yes   Other Topics Concern  . Not on file   Social History Narrative  . No narrative on file    Past Surgical History  Procedure Date  . Inguinal hernia repair   .  Colonoscopy w/ polypectomy   . Ulnar intrapment release 07/2008    Family History  Problem Relation Age of Onset  . Hypertension Mother   . Hyperlipidemia Mother   . Cancer Father     pancreatic cancer    Allergies  Allergen Reactions  . Ciprofloxacin Hcl     REACTION: NAUSIA AND VOMITING  . Sulfamethoxazole W/Trimethoprim     REACTION: nausea and vomiting  . Sulfonamide Derivatives     REACTION: itching    Current Outpatient Prescriptions on File Prior to Visit  Medication Sig Dispense Refill  . colchicine (COLCRYS) 0.6 MG tablet Take 1 tablet (0.6 mg total) by mouth 2 (two) times daily.  60 tablet  0  . Dietary Management Product (TOZAL) CAPS Take 1 capsule by mouth 3 (three) times daily.  90 capsule  6  . fenofibrate (TRICOR) 145 MG tablet Take 1 tablet (145 mg total) by mouth daily.  30 tablet  11    BP 150/90  Pulse 72  Temp 98.2 F (36.8 C)  Resp 16  Ht 5\' 11"  (1.803 m)  Wt 251 lb (113.853 kg)  BMI 35.01 kg/m2    Objective:   Physical Exam  Constitutional: He appears well-developed and well-nourished.  HENT:  Head: Normocephalic and atraumatic.  Eyes: Conjunctivae are normal. Pupils are equal, round, and reactive to light.  Neck: Normal range of motion. Neck supple. **Note De-Identified Horn Obfuscation** Cardiovascular: Normal rate and regular rhythm.   Pulmonary/Chest: Effort normal and breath sounds normal.  Abdominal: Soft. Bowel sounds are normal.           Assessment & Plan:  Hypertension. Increase Benicar from 20-40 mg and monitor.  Chronic back pain refill Percocet a limited basis with monitored use of narcotics based upon the chronic use contract.  Hyperlipidemia on TriCor improved to 50 points total cholesterol have improved HDL cholesterol target and weight loss and diet modification.  Gout without recent further instructions on use of colchicine when necessary

## 2011-04-04 ENCOUNTER — Ambulatory Visit: Payer: BC Managed Care – PPO | Admitting: Internal Medicine

## 2011-05-02 ENCOUNTER — Ambulatory Visit: Payer: BC Managed Care – PPO | Admitting: Internal Medicine

## 2011-05-03 ENCOUNTER — Encounter: Payer: Self-pay | Admitting: Internal Medicine

## 2011-05-03 ENCOUNTER — Ambulatory Visit (INDEPENDENT_AMBULATORY_CARE_PROVIDER_SITE_OTHER): Payer: BC Managed Care – PPO | Admitting: Internal Medicine

## 2011-05-03 VITALS — BP 142/92 | HR 68 | Temp 98.5°F | Ht 71.0 in | Wt 259.0 lb

## 2011-05-03 DIAGNOSIS — I10 Essential (primary) hypertension: Secondary | ICD-10-CM

## 2011-05-03 DIAGNOSIS — E785 Hyperlipidemia, unspecified: Secondary | ICD-10-CM

## 2011-05-03 DIAGNOSIS — Z0000 Encounter for general adult medical examination without abnormal findings: Secondary | ICD-10-CM

## 2011-05-03 DIAGNOSIS — M5412 Radiculopathy, cervical region: Secondary | ICD-10-CM

## 2011-05-03 MED ORDER — PREDNISONE 20 MG PO TABS: 20.0000 mg | ORAL_TABLET | Freq: Every day | ORAL | Status: AC

## 2011-05-03 MED ORDER — OLMESARTAN MEDOXOMIL 40 MG PO TABS: 40.0000 mg | ORAL_TABLET | Freq: Every day | ORAL | Status: DC

## 2011-05-03 MED ORDER — NABUMETONE 750 MG PO TABS: 750.0000 mg | ORAL_TABLET | Freq: Two times a day (BID) | ORAL | Status: AC

## 2011-05-03 NOTE — Patient Instructions (Signed)
**Note De-identified Regis Obfuscation** The patient is instructed to continue all medications as prescribed. Schedule followup with check out clerk upon leaving the clinic  

## 2011-05-03 NOTE — Progress Notes (Signed)
**Note De-Identified Glendenning Obfuscation** Subjective:    Patient ID: Daniel Brock, male    DOB: [birth year], 66 y.o.   MRN: [id number]  HPI  Has increased the use of the percocet due to shoulder pain The pain is in the trapezius muscle on the right radiating into the shoulder and down the biceps. It is worse when he lies on his left side he feels the pain radiate down into his right shoulder. He is followed for hypertension hyperlipidemia history of gouty arthritis and history of chronic back pain.  Review of Systems  Constitutional: Negative for fever and fatigue.  HENT: Negative for hearing loss, congestion, neck pain and postnasal drip.   Eyes: Negative for discharge, redness and visual disturbance.  Respiratory: Negative for cough, shortness of breath and wheezing.   Cardiovascular: Negative for leg swelling.  Gastrointestinal: Negative for abdominal pain, constipation and abdominal distention.  Genitourinary: Negative for urgency and frequency.  Musculoskeletal: Positive for myalgias, joint swelling and arthralgias.       Radiating to the right shoulder  Skin: Negative for color change and rash.  Neurological: Negative for weakness and light-headedness.  Hematological: Negative for adenopathy.  Psychiatric/Behavioral: Negative for behavioral problems.   Past Medical History  Diagnosis Date  . Chronic kidney disease   . Hyperlipidemia   . Hiatal hernia   . Chronic leg pain   . Chronic foot pain   . Renal insufficiency   . Hypertension   . Allergy   . Gout     History   Social History  . Marital Status: Married    Spouse Name: N/A    Number of Children: N/A  . Years of Education: N/A   Occupational History  . Not on file.   Social History Main Topics  . Smoking status: Never Smoker   . Smokeless tobacco: Not on file  . Alcohol Use: No  . Drug Use: No  . Sexually Active: Yes   Other Topics Concern  . Not on file   Social History Narrative  . No narrative on file    Past Surgical History  Procedure  Date  . Inguinal hernia repair   . Colonoscopy w/ polypectomy   . Ulnar intrapment release 07/2008    Family History  Problem Relation Age of Onset  . Hypertension Mother   . Hyperlipidemia Mother   . Cancer Father     pancreatic cancer    Allergies  Allergen Reactions  . Ciprofloxacin Hcl     REACTION: NAUSIA AND VOMITING  . Sulfamethoxazole W/Trimethoprim     REACTION: nausea and vomiting  . Sulfonamide Derivatives     REACTION: itching    Current Outpatient Prescriptions on File Prior to Visit  Medication Sig Dispense Refill  . colchicine (COLCRYS) 0.6 MG tablet Take 1 tablet (0.6 mg total) by mouth 2 (two) times daily.  60 tablet  0  . Dietary Management Product (TOZAL) CAPS Take 1 capsule by mouth 3 (three) times daily.  90 capsule  6  . fenofibrate (TRICOR) 145 MG tablet Take 1 tablet (145 mg total) by mouth daily.  30 tablet  11  . oxyCODONE-acetaminophen (PERCOCET) 7.5-325 MG per tablet Take 1 tablet by mouth every 8 (eight) hours as needed.  60 tablet  0  . oxyCODONE-acetaminophen (PERCOCET) 7.5-325 MG per tablet Take 1 tablet by mouth every 8 (eight) hours as needed.  60 tablet  0  . sildenafil (VIAGRA) 100 MG tablet Take 1 tablet (100 mg total) by mouth as needed for erectile **Note De-Identified Weikel Obfuscation** dysfunction.  20 tablet  1    BP 142/92  Pulse 68  Temp(Src) 98.5 F (36.9 C) (Oral)  Ht 5\' 11"  (1.803 m)  Wt 259 lb (117.482 kg)  BMI 36.12 kg/m2       Objective:   Physical Exam  Constitutional: He is oriented to person, place, and time. He appears well-developed and well-nourished.  HENT:  Head: Normocephalic and atraumatic.  Eyes: Conjunctivae are normal. Pupils are equal, round, and reactive to light.  Neck: Normal range of motion. Neck supple.       Stiff with trapezius tenderness  Cardiovascular: Normal rate and regular rhythm.   Pulmonary/Chest: Effort normal and breath sounds normal.  Abdominal: Soft. Bowel sounds are normal.  Musculoskeletal: He exhibits edema and  tenderness.  Neurological: He is alert and oriented to person, place, and time.  Skin: Skin is dry.          Assessment & Plan:  We will do a point injection for muscle release in the upper please use muscle on the right-hand side and a steroid burst and tender for probable disc impingement on the C. VII nerve also relafen 750 BID  Blood pressure stable on current medications  He has run out of Benicar we will give him samples and send a prescription in.  He also is treated for hyperlipidemia with tricor

## 2011-05-19 ENCOUNTER — Telehealth: Payer: Self-pay | Admitting: Internal Medicine

## 2011-05-19 DIAGNOSIS — I10 Essential (primary) hypertension: Secondary | ICD-10-CM

## 2011-05-19 MED ORDER — LOSARTAN POTASSIUM 50 MG PO TABS: 50.0000 mg | ORAL_TABLET | Freq: Two times a day (BID) | ORAL | Status: DC

## 2011-05-19 NOTE — Telephone Encounter (Signed)
**Note De-Identified Sackrider Obfuscation** Patient spouse is calling stating that 18days ago her husband was given an rx for benicar and it too expensive and they are requesting a generic be called into the pharmacy. Patient spouse states that the pharmacy has requested this twice. Please advise.

## 2011-05-19 NOTE — Telephone Encounter (Signed)
**Note De-Identified Fenstermacher Obfuscation** Changed to cozaar 50 bid

## 2011-05-27 ENCOUNTER — Other Ambulatory Visit: Payer: Self-pay | Admitting: Internal Medicine

## 2011-05-27 DIAGNOSIS — M549 Dorsalgia, unspecified: Secondary | ICD-10-CM

## 2011-05-27 MED ORDER — OXYCODONE-ACETAMINOPHEN 7.5-325 MG PO TABS: 1.0000 | ORAL_TABLET | Freq: Three times a day (TID) | ORAL | Status: DC | PRN

## 2011-05-27 NOTE — Telephone Encounter (Signed)
**Note De-Identified Turay Obfuscation** Printed and awaiting signature- will call when ready

## 2011-05-27 NOTE — Telephone Encounter (Signed)
**Note De-Identified Deavers Obfuscation** Pt needs new rx percocet. Pt would like 3 separate rx for next 3 months.

## 2011-07-25 ENCOUNTER — Other Ambulatory Visit (INDEPENDENT_AMBULATORY_CARE_PROVIDER_SITE_OTHER): Payer: BC Managed Care – PPO

## 2011-07-25 DIAGNOSIS — Z0000 Encounter for general adult medical examination without abnormal findings: Secondary | ICD-10-CM

## 2011-07-25 LAB — HEPATIC FUNCTION PANEL
AST: 14 U/L (ref 0–37)
Albumin: 3.7 g/dL (ref 3.5–5.2)
Alkaline Phosphatase: 80 U/L (ref 39–117)
Bilirubin, Direct: 0.1 mg/dL (ref 0.0–0.3)

## 2011-07-25 LAB — POCT URINALYSIS DIPSTICK
Bilirubin, UA: NEGATIVE
Glucose, UA: NEGATIVE
Ketones, UA: NEGATIVE
Leukocytes, UA: NEGATIVE
Nitrite, UA: NEGATIVE
pH, UA: 5

## 2011-07-25 LAB — CBC WITH DIFFERENTIAL/PLATELET
Basophils Relative: 0.4 % (ref 0.0–3.0)
Eosinophils Absolute: 0.1 10*3/uL (ref 0.0–0.7)
Eosinophils Relative: 2 % (ref 0.0–5.0)
Hemoglobin: 14.1 g/dL (ref 13.0–17.0)
Lymphocytes Relative: 25.4 % (ref 12.0–46.0)
MCHC: 33.1 g/dL (ref 30.0–36.0)
Monocytes Relative: 7.7 % (ref 3.0–12.0)
Neutro Abs: 2.7 10*3/uL (ref 1.4–7.7)
Neutrophils Relative %: 64.5 % (ref 43.0–77.0)
RBC: 4.91 Mil/uL (ref 4.22–5.81)
WBC: 4.2 10*3/uL — ABNORMAL LOW (ref 4.5–10.5)

## 2011-07-25 LAB — BASIC METABOLIC PANEL
CO2: 26 mEq/L (ref 19–32)
Calcium: 8.8 mg/dL (ref 8.4–10.5)
GFR: 61.76 mL/min (ref >60.00)
Glucose, Bld: 87 mg/dL (ref 70–99)
Potassium: 4.3 mEq/L (ref 3.5–5.1)
Sodium: 147 mEq/L — ABNORMAL HIGH (ref 135–145)

## 2011-07-25 LAB — PSA: PSA: 1.86 ng/mL (ref 0.10–4.00)

## 2011-07-25 LAB — LIPID PANEL
HDL: 37.4 mg/dL — ABNORMAL LOW (ref >39.00)
VLDL: 64 mg/dL — ABNORMAL HIGH (ref 0.0–40.0)

## 2011-07-25 LAB — LDL CHOLESTEROL, DIRECT: Direct LDL: 102.1 mg/dL

## 2011-08-01 ENCOUNTER — Ambulatory Visit (INDEPENDENT_AMBULATORY_CARE_PROVIDER_SITE_OTHER): Payer: BC Managed Care – PPO | Admitting: Internal Medicine

## 2011-08-01 VITALS — BP 140/80 | HR 72 | Temp 98.2°F | Resp 16 | Ht 71.0 in | Wt 252.0 lb

## 2011-08-01 DIAGNOSIS — N419 Inflammatory disease of prostate, unspecified: Secondary | ICD-10-CM

## 2011-08-01 DIAGNOSIS — E785 Hyperlipidemia, unspecified: Secondary | ICD-10-CM

## 2011-08-01 DIAGNOSIS — I10 Essential (primary) hypertension: Secondary | ICD-10-CM

## 2011-08-01 MED ORDER — LOSARTAN POTASSIUM-HCTZ 100-12.5 MG PO TABS: 1.0000 | ORAL_TABLET | Freq: Every day | ORAL | Status: DC

## 2011-08-01 MED ORDER — CEFUROXIME AXETIL 250 MG PO TABS: 250.0000 mg | ORAL_TABLET | Freq: Two times a day (BID) | ORAL | Status: AC

## 2011-08-01 NOTE — Progress Notes (Signed)
**Note De-identified Girgis Obfuscation**  **Note De-Identified Spiering Obfuscation** Subjective:    Patient ID: Daniel Brock, male    DOB: [birth year], 66 y.o.   MRN: [id number]  HPI Still has pain in the right arm from shoulder down Possible cervical nerve compression Symptoms of acute prostatitis Moderate edema in feet    Review of Systems  Constitutional: Negative.   HENT: Positive for neck pain and neck stiffness.   Eyes: Negative.   Respiratory: Negative.   Cardiovascular: Negative.   Genitourinary: Negative.   Musculoskeletal: Positive for joint swelling and gait problem.  Neurological: Negative.        Objective:   Physical Exam  Nursing note and vitals reviewed. Constitutional: He appears well-developed and well-nourished.  HENT:  Head: Normocephalic and atraumatic.  Eyes: Conjunctivae normal are normal. Pupils are equal, round, and reactive to light.  Neck: Normal range of motion. Neck supple.  Cardiovascular: Normal rate and regular rhythm.   Pulmonary/Chest: Effort normal and breath sounds normal.  Abdominal: Soft. Bowel sounds are normal.  Musculoskeletal: He exhibits edema and tenderness.       Neck stiffness with radicular pain.     prostate +2 and boggy        Assessment & Plan:  Neck exercises for cervical radiculopathy Treat prostatitis with cipro for 14 days  Acute prostatitis

## 2011-08-01 NOTE — Patient Instructions (Signed)
**Note De-identified Vasques Obfuscation** The patient is instructed to continue all medications as prescribed. Schedule followup with check out clerk upon leaving the clinic  

## 2011-08-29 ENCOUNTER — Telehealth: Payer: Self-pay | Admitting: Internal Medicine

## 2011-08-29 DIAGNOSIS — M549 Dorsalgia, unspecified: Secondary | ICD-10-CM

## 2011-08-29 DIAGNOSIS — N529 Male erectile dysfunction, unspecified: Secondary | ICD-10-CM

## 2011-08-29 MED ORDER — OXYCODONE-ACETAMINOPHEN 7.5-325 MG PO TABS: 1.0000 | ORAL_TABLET | Freq: Three times a day (TID) | ORAL | Status: DC | PRN

## 2011-08-29 NOTE — Telephone Encounter (Signed)
**Note De-Identified Colpitts Obfuscation** Printed and will call pt when they are ready for pick up

## 2011-08-29 NOTE — Telephone Encounter (Signed)
**Note De-Identified Poust Obfuscation** Patient called stating that he need a refill of his percocet per pt preferably tomorrow as he will be coming through [facility]. Please assist.

## 2011-11-02 ENCOUNTER — Encounter: Payer: Self-pay | Admitting: Internal Medicine

## 2011-11-02 ENCOUNTER — Ambulatory Visit (INDEPENDENT_AMBULATORY_CARE_PROVIDER_SITE_OTHER): Payer: BC Managed Care – PPO | Admitting: Internal Medicine

## 2011-11-02 VITALS — BP 154/94 | HR 88 | Temp 98.2°F | Resp 18 | Ht 71.0 in | Wt 258.0 lb

## 2011-11-02 DIAGNOSIS — I10 Essential (primary) hypertension: Secondary | ICD-10-CM

## 2011-11-02 DIAGNOSIS — N259 Disorder resulting from impaired renal tubular function, unspecified: Secondary | ICD-10-CM

## 2011-11-02 DIAGNOSIS — M109 Gout, unspecified: Secondary | ICD-10-CM

## 2011-11-02 DIAGNOSIS — M549 Dorsalgia, unspecified: Secondary | ICD-10-CM

## 2011-11-02 MED ORDER — OXYCODONE-ACETAMINOPHEN 7.5-325 MG PO TABS: 1.0000 | ORAL_TABLET | Freq: Three times a day (TID) | ORAL | Status: DC | PRN

## 2011-11-02 MED ORDER — LISINOPRIL-HYDROCHLOROTHIAZIDE 20-12.5 MG PO TABS: 1.0000 | ORAL_TABLET | Freq: Every day | ORAL | Status: DC

## 2011-11-02 NOTE — Progress Notes (Signed)
**Note De-Identified [place] Obfuscation** Subjective:    Patient ID: Daniel Brock, male    DOB: 03-22-1945, 66 y.o.   MRN: [id number]  HPI Patient is a 66 year old male with elevated blood pressure persistent elevation of blood pressure despite blood pressure medications not in good control other factors include gaining weight.  Patient states that he has a headache more frequently that may be related to his elevated blood pressure he also has increased risk factors of being male having more sedentary lifestyle and also having weight and age as factors   Review of Systems  Constitutional: Negative for fever and fatigue.  HENT: Negative for hearing loss, congestion, neck pain and postnasal drip.   Eyes: Negative for discharge, redness and visual disturbance.  Respiratory: Negative for cough, shortness of breath and wheezing.   Cardiovascular: Negative for leg swelling.  Gastrointestinal: Negative for abdominal pain, constipation and abdominal distention.  Genitourinary: Negative for urgency and frequency.  Musculoskeletal: Negative for joint swelling and arthralgias.  Skin: Negative for color change and rash.  Neurological: Negative for weakness and light-headedness.  Hematological: Negative for adenopathy.  Psychiatric/Behavioral: Negative for behavioral problems.   Past Medical History  Diagnosis Date  . Chronic kidney disease   . Hyperlipidemia   . Hiatal hernia   . Chronic leg pain   . Chronic foot pain   . Renal insufficiency   . Hypertension   . Allergy   . Gout     History   Social History  . Marital Status: Married    Spouse Name: N/A    Number of Children: N/A  . Years of Education: N/A   Occupational History  . Not on file.   Social History Main Topics  . Smoking status: Never Smoker   . Smokeless tobacco: Not on file  . Alcohol Use: No  . Drug Use: No  . Sexually Active: Yes   Other Topics Concern  . Not on file   Social History Narrative  . No narrative on file    Past Surgical History    Procedure Date  . Inguinal hernia repair   . Colonoscopy w/ polypectomy   . Ulnar intrapment release 07/2008    Family History  Problem Relation Age of Onset  . Hypertension Mother   . Hyperlipidemia Mother   . Cancer Father     pancreatic cancer    Allergies  Allergen Reactions  . Ciprofloxacin Hcl     REACTION: NAUSIA AND VOMITING  . Sulfamethoxazole W-Trimethoprim     REACTION: nausea and vomiting  . Sulfonamide Derivatives     REACTION: itching    Current Outpatient Prescriptions on File Prior to Visit  Medication Sig Dispense Refill  . colchicine (COLCRYS) 0.6 MG tablet Take 1 tablet (0.6 mg total) by mouth 2 (two) times daily.  60 tablet  0  . losartan-hydrochlorothiazide (HYZAAR) 100-12.5 MG per tablet Take 1 tablet by mouth daily.  90 tablet  3  . nabumetone (RELAFEN) 750 MG tablet Take 1 tablet (750 mg total) by mouth 2 (two) times daily.  60 tablet  2  . sildenafil (VIAGRA) 100 MG tablet Take 1 tablet (100 mg total) by mouth as needed for erectile dysfunction.  20 tablet  1  . DISCONTD: fenofibrate (TRICOR) 145 MG tablet Take 1 tablet (145 mg total) by mouth daily.  30 tablet  11    BP 154/94  Pulse 88  Temp 98.2 F (36.8 C)  Resp 18  Ht 5\' 11"  (1.803 m)  Wt 258 lb (117.028 **Note De-Identified [place] Obfuscation** kg)  BMI 35.98 kg/m2       Objective:   Physical Exam  Nursing note and vitals reviewed. Constitutional: He appears well-developed and well-nourished.  HENT:  Head: Normocephalic and atraumatic.  Eyes: Conjunctivae are normal. Pupils are equal, round, and reactive to light.  Neck: Normal range of motion. Neck supple.  Cardiovascular: Normal rate and regular rhythm.   Pulmonary/Chest: Effort normal and breath sounds normal.  Abdominal: Soft. Bowel sounds are normal.          Assessment & Plan:  The patient had been on a different medication prior to changing to Hyzaar because of his formulary.  It does not appear that in the 100 mg total of 5 mg dose of Hyzaar is  effectively controlling his blood pressure.  He does have a history of gout has a history of hyperlipidemia on TriCor  Some blood work in May showed stable renal function with creatinine of 1.2 that was improved from a previous high of 1.5

## 2011-11-02 NOTE — Patient Instructions (Addendum)
**Note De-Identified [place] [facility]bfuscation** Change from the Hyzaar to lisinopril to see if this will better control of blood pressure

## 2011-12-14 ENCOUNTER — Encounter: Payer: Self-pay | Admitting: Internal Medicine

## 2012-01-18 ENCOUNTER — Ambulatory Visit: Payer: BC Managed Care – PPO | Admitting: Internal Medicine

## 2012-05-28 ENCOUNTER — Telehealth: Payer: Self-pay | Admitting: Internal Medicine

## 2012-05-28 NOTE — Telephone Encounter (Signed)
**Note De-Identified [place] [facility]bfuscation** Patient Information:  Caller Name: Harriett Sine [phone])  Phone: [phone]  Patient: Daniel Brock, Daniel Brock  Gender: Male  DOB: October 28, 1945  Age: 67 Years  PCP: Darryll Capers (Adults only)  Office Follow Up:  Does the office need to follow up with this patient?: Yes  Instructions For The Office: Disposition to see go to Office Now..No appts available w/i the next hour.  Please follow up w/pt.   Caller 40-50 away. Thank you.   Symptoms  Reason For Call & Symptoms: Bloood pressure drops after pt takes BP meds. Pt refrained from taking medication to and is not having those sx. Calller states pt seems to be out of breath.  Reviewed Health History In EMR: Yes  Reviewed Medications In EMR: Yes  Reviewed Allergies In EMR: Yes  Reviewed Surgeries / Procedures: Yes  Date of Onset of Symptoms: 04/30/2012  Guideline(s) Used:  Dizziness  Breathing Difficulty  Disposition Per Guideline:   Go to Office Now  Reason For Disposition Reached:   Mild difficulty breathing (e.g., minimal/no SOB at rest, SOB with walking, pulse < 100) of new onset or worse than normal  Advice Given:  Drink Fluids:  Drink several glasses of fruit juice, other clear fluids, or water. This will improve hydration and blood glucose. If you have a fever or have had heat exposure, make sure the fluids are cold.  Stand Up Slowly:  In the mornings, sit up for a few minutes before you stand up. That will help your blood flow make the adjustment.  Call Back If:  Severe difficulty breathing occurs  Fever more than 100.5 F (38.1 C)  You become worse.

## 2012-05-28 NOTE — Telephone Encounter (Signed)
**Note De-identified [place] [facility]bfuscation** Please advise 

## 2012-05-29 ENCOUNTER — Ambulatory Visit (INDEPENDENT_AMBULATORY_CARE_PROVIDER_SITE_OTHER): Payer: BC Managed Care – PPO | Admitting: Family Medicine

## 2012-05-29 ENCOUNTER — Encounter: Payer: Self-pay | Admitting: Family Medicine

## 2012-05-29 VITALS — BP 130/82 | HR 86 | Temp 98.6°F | Wt 256.0 lb

## 2012-05-29 DIAGNOSIS — I10 Essential (primary) hypertension: Secondary | ICD-10-CM

## 2012-05-29 MED ORDER — LISINOPRIL-HYDROCHLOROTHIAZIDE 10-12.5 MG PO TABS
1.0000 | ORAL_TABLET | Freq: Every day | ORAL | Status: DC
Start: 2012-05-29 — End: 2012-09-07

## 2012-05-29 NOTE — Progress Notes (Signed)
**Note De-identified [place] [facility]usc[facility]**  **Note De-Identified [place] Obfuscation** Subjective:    Patient ID: Daniel Brock, male    DOB: 11/27/1945, 67 y.o.   MRN: [id number]  HPI Here to discuss his BP. Last summer he was changed from Losartan HCT to Lisinopril HCT, and he did well for a time. Then about a month ago he began to feel lightheaded and weak, and he noticed his BP was always low. He would often get values in the range of 60s or 70s systolic. No SOB or chest pains. He stopped taking the BP pills 4 days ago and now he feels fine again.    Review of Systems  Constitutional: Negative.   Respiratory: Negative.   Cardiovascular: Negative.   Neurological: Negative.        Objective:   Physical Exam  Constitutional: He is oriented to person, place, and time. He appears well-developed and well-nourished.  Neck: No thyromegaly present.  Cardiovascular: Normal rate, regular rhythm, normal heart sounds and intact distal pulses.   Pulmonary/Chest: Effort normal and breath sounds normal.  Lymphadenopathy:    He has no cervical adenopathy.  Neurological: He is alert and oriented to person, place, and time.          Assessment & Plan:  He needs to be on something or else his BP is liable to go abck up again. We will decrease the dose of Lisinopril HCT to 10/12.5 daily. Recheck with Dr. Lovell Sheehan in one month

## 2012-09-07 ENCOUNTER — Ambulatory Visit (INDEPENDENT_AMBULATORY_CARE_PROVIDER_SITE_OTHER)
Admission: RE | Admit: 2012-09-07 | Discharge: 2012-09-07 | Disposition: A | Discharge status: Home / Self Care | Payer: BC Managed Care – PPO | Source: Ambulatory Visit | Attending: Internal Medicine | Admitting: Internal Medicine

## 2012-09-07 ENCOUNTER — Ambulatory Visit (INDEPENDENT_AMBULATORY_CARE_PROVIDER_SITE_OTHER): Payer: BC Managed Care – PPO | Admitting: Internal Medicine

## 2012-09-07 ENCOUNTER — Encounter: Payer: Self-pay | Admitting: Internal Medicine

## 2012-09-07 VITALS — BP 130/80 | HR 72 | Temp 98.6°F | Resp 16 | Ht 71.0 in | Wt 238.0 lb

## 2012-09-07 DIAGNOSIS — M549 Dorsalgia, unspecified: Secondary | ICD-10-CM

## 2012-09-07 DIAGNOSIS — I83893 Varicose veins of bilateral lower extremities with other complications: Secondary | ICD-10-CM

## 2012-09-07 DIAGNOSIS — N138 Other obstructive and reflux uropathy: Secondary | ICD-10-CM

## 2012-09-07 DIAGNOSIS — I10 Essential (primary) hypertension: Secondary | ICD-10-CM

## 2012-09-07 DIAGNOSIS — N401 Enlarged prostate with lower urinary tract symptoms: Secondary | ICD-10-CM

## 2012-09-07 DIAGNOSIS — N259 Disorder resulting from impaired renal tubular function, unspecified: Secondary | ICD-10-CM

## 2012-09-07 MED ORDER — TERAZOSIN HCL 2 MG PO CAPS: 2.0000 mg | ORAL_CAPSULE | Freq: Every day | ORAL | Status: DC

## 2012-09-07 NOTE — Patient Instructions (Addendum)
**Note De-Identified [place] Obfuscation** The patient is instructed to continue all medications as prescribed. Schedule followup with check out clerk upon leaving the clinic Back Exercises Back exercises help treat and prevent back injuries. The goal of back exercises is to increase the strength of your abdominal and back muscles and the flexibility of your back. These exercises should be started when you no longer have back pain. Back exercises include:  Pelvic Tilt. Lie on your back with your knees bent. Tilt your pelvis until the lower part of your back is against the floor. Hold this position 5 to 10 sec and repeat 5 to 10 times.  Knee to Chest. Pull first 1 knee up against your chest and hold for 20 to 30 seconds, repeat this with the other knee, and then both knees. This may be done with the other leg straight or bent, whichever feels better.  Sit-Ups or Curl-Ups. Bend your knees 90 degrees. Start with tilting your pelvis, and do a partial, slow sit-up, lifting your trunk only 30 to 45 degrees off the floor. Take at least 2 to 3 seconds for each sit-up. Do not do sit-ups with your knees out straight. If partial sit-ups are difficult, simply do the above but with only tightening your abdominal muscles and holding it as directed.  Hip-Lift. Lie on your back with your knees flexed 90 degrees. Push down with your feet and shoulders as you raise your hips a couple inches off the floor; hold for 10 seconds, repeat 5 to 10 times.  Back arches. Lie on your stomach, propping yourself up on bent elbows. Slowly press on your hands, causing an arch in your low back. Repeat 3 to 5 times. Any initial stiffness and discomfort should lessen with repetition over time.  Shoulder-Lifts. Lie face down with arms beside your body. Keep hips and torso pressed to floor as you slowly lift your head and shoulders off the floor. Do not overdo your exercises, especially in the beginning. Exercises may cause you some mild back discomfort which lasts for a few  minutes; however, if the pain is more severe, or lasts for more than 15 minutes, do not continue exercises until you see your caregiver. Improvement with exercise therapy for back problems is slow.  See your caregivers for assistance with developing a proper back exercise program. Document Released: 04/14/2004 Document Revised: 05/30/2011 Document Reviewed: 01/06/2011 [facility] Patient Information 2014 [place], [place].

## 2012-09-07 NOTE — Progress Notes (Signed)
**Note De-Identified [place] Obfuscation** Subjective:    Patient ID: Daniel Brock, male    DOB: 1945/08/29, 67 y.o.   MRN: [id number]  HPI  Mild BPH with frequency and dribbling Weight loss noted Off the lisenopril and the weight loss has resulted in blood pressure controll Wrist pain on the left and chronic back pain   Review of Systems  Constitutional: Negative for fever and fatigue.  HENT: Negative for hearing loss, congestion, neck pain and postnasal drip.   Eyes: Negative for discharge, redness and visual disturbance.  Respiratory: Negative for cough, shortness of breath and wheezing.   Cardiovascular: Negative for leg swelling.  Gastrointestinal: Negative for abdominal pain, constipation and abdominal distention.  Genitourinary: Negative for urgency and frequency.  Musculoskeletal: Negative for joint swelling and arthralgias.  Skin: Negative for color change and rash.  Neurological: Negative for weakness and light-headedness.  Hematological: Negative for adenopathy.  Psychiatric/Behavioral: Negative for behavioral problems.   Past Medical History  Diagnosis Date  . Chronic kidney disease   . Hyperlipidemia   . Hiatal hernia   . Chronic leg pain   . Chronic foot pain   . Renal insufficiency   . Hypertension   . Allergy   . Gout     History   Social History  . Marital Status: Married    Spouse Name: N/A    Number of Children: N/A  . Years of Education: N/A   Occupational History  . Not on file.   Social History Main Topics  . Smoking status: Never Smoker   . Smokeless tobacco: Never Used  . Alcohol Use: No  . Drug Use: No  . Sexually Active: Yes   Other Topics Concern  . Not on file   Social History Narrative  . No narrative on file    Past Surgical History  Procedure Laterality Date  . Inguinal hernia repair    . Colonoscopy w/ polypectomy    . Ulnar intrapment release  07/2008    Family History  Problem Relation Age of Onset  . Hypertension Mother   . Hyperlipidemia Mother   .  Cancer Father     pancreatic cancer    Allergies  Allergen Reactions  . Ciprofloxacin Hcl     REACTION: NAUSIA AND VOMITING  . Sulfamethoxazole W-Trimethoprim     REACTION: nausea and vomiting  . Sulfonamide Derivatives     REACTION: itching    Current Outpatient Prescriptions on File Prior to Visit  Medication Sig Dispense Refill  . fenofibrate (TRICOR) 145 MG tablet Take 145 mg by mouth daily.      Marland Kitchen lisinopril-hydrochlorothiazide (PRINZIDE,ZESTORETIC) 10-12.5 MG per tablet Take 1 tablet by mouth daily.  30 tablet  5  . sildenafil (VIAGRA) 100 MG tablet Take 1 tablet (100 mg total) by mouth as needed for erectile dysfunction.  20 tablet  1   No current facility-administered medications on file prior to visit.    BP 130/80  Pulse 72  Temp(Src) 98.6 F (37 C)  Resp 16  Ht 5\' 11"  (1.803 m)  Wt 238 lb (107.956 kg)  BMI 33.21 kg/m2       Objective:   Physical Exam  Nursing note and vitals reviewed. Constitutional: He is oriented to person, place, and time. He appears well-developed and well-nourished.  HENT:  Head: Normocephalic and atraumatic.  Eyes: Conjunctivae are normal. Pupils are equal, round, and reactive to light.  Neck: Normal range of motion. Neck supple.  Cardiovascular: Normal rate and regular rhythm.   Pulmonary/Chest: Effort **Note De-Identified [place] Obfuscation** normal and breath sounds normal.  Abdominal: Soft. Bowel sounds are normal.  Neurological: He is alert and oriented to person, place, and time.     Severe varicosities with edema and pain in both legs are noted     Assessment & Plan:  Back pain.... Chronic worsened with sitting suggesting spondylosis Wrist pain and hx of poly articular arthritis HTN BPH with trial of hytrin Failure of resolution of varicosity pain with compression hose refer to interventional radiology for laser ablation therapy.

## 2012-09-10 ENCOUNTER — Other Ambulatory Visit: Payer: Self-pay | Admitting: Internal Medicine

## 2012-09-10 DIAGNOSIS — I83813 Varicose veins of bilateral lower extremities with pain: Secondary | ICD-10-CM

## 2012-09-26 ENCOUNTER — Other Ambulatory Visit: Payer: BC Managed Care – PPO

## 2012-10-24 ENCOUNTER — Other Ambulatory Visit: Payer: Self-pay

## 2013-01-24 ENCOUNTER — Other Ambulatory Visit: Payer: Self-pay

## 2013-03-01 ENCOUNTER — Encounter: Payer: Self-pay | Admitting: Internal Medicine

## 2013-03-01 ENCOUNTER — Ambulatory Visit (INDEPENDENT_AMBULATORY_CARE_PROVIDER_SITE_OTHER): Payer: BC Managed Care – PPO | Admitting: Internal Medicine

## 2013-03-01 VITALS — BP 130/82 | HR 76 | Temp 98.6°F | Resp 16 | Ht 71.0 in | Wt 240.0 lb

## 2013-03-01 DIAGNOSIS — T887XXA Unspecified adverse effect of drug or medicament, initial encounter: Secondary | ICD-10-CM

## 2013-03-01 DIAGNOSIS — M1030 Gout due to renal impairment, unspecified site: Secondary | ICD-10-CM

## 2013-03-01 DIAGNOSIS — R972 Elevated prostate specific antigen [PSA]: Secondary | ICD-10-CM

## 2013-03-01 DIAGNOSIS — Z0000 Encounter for general adult medical examination without abnormal findings: Secondary | ICD-10-CM

## 2013-03-01 DIAGNOSIS — N259 Disorder resulting from impaired renal tubular function, unspecified: Secondary | ICD-10-CM

## 2013-03-01 DIAGNOSIS — E785 Hyperlipidemia, unspecified: Secondary | ICD-10-CM

## 2013-03-01 DIAGNOSIS — I10 Essential (primary) hypertension: Secondary | ICD-10-CM

## 2013-03-01 LAB — POCT URINALYSIS DIPSTICK
Bilirubin, UA: NEGATIVE
Blood, UA: NEGATIVE
Ketones, UA: NEGATIVE
Leukocytes, UA: NEGATIVE
Nitrite, UA: NEGATIVE
Protein, UA: NEGATIVE
Urobilinogen, UA: 0.2
pH, UA: 5

## 2013-03-01 LAB — BASIC METABOLIC PANEL
BUN: 25 mg/dL — ABNORMAL HIGH (ref 6–23)
CO2: 28 mEq/L (ref 19–32)
Calcium: 9.3 mg/dL (ref 8.4–10.5)
Glucose, Bld: 89 mg/dL (ref 70–99)
Potassium: 4.4 mEq/L (ref 3.5–5.1)
Sodium: 140 mEq/L (ref 135–145)

## 2013-03-01 LAB — HEPATIC FUNCTION PANEL
AST: 19 U/L (ref 0–37)
Albumin: 4.3 g/dL (ref 3.5–5.2)
Alkaline Phosphatase: 84 U/L (ref 39–117)
Total Protein: 6.7 g/dL (ref 6.0–8.3)

## 2013-03-01 LAB — CBC WITH DIFFERENTIAL/PLATELET
Basophils Absolute: 0 10*3/uL (ref 0.0–0.1)
Eosinophils Absolute: 0.1 10*3/uL (ref 0.0–0.7)
HCT: 46.4 % (ref 39.0–52.0)
Hemoglobin: 15.6 g/dL (ref 13.0–17.0)
Lymphs Abs: 1.4 10*3/uL (ref 0.7–4.0)
MCHC: 33.5 g/dL (ref 30.0–36.0)
Monocytes Absolute: 0.5 10*3/uL (ref 0.1–1.0)
Monocytes Relative: 9.2 % (ref 3.0–12.0)
Neutro Abs: 3.8 10*3/uL (ref 1.4–7.7)
Neutrophils Relative %: 65.4 % (ref 43.0–77.0)
RDW: 15.2 % — ABNORMAL HIGH (ref 11.5–14.6)

## 2013-03-01 LAB — LIPID PANEL: Cholesterol: 256 mg/dL — ABNORMAL HIGH (ref 0–200)

## 2013-03-01 MED ORDER — ALLOPURINOL 300 MG PO TABS: 300.0000 mg | ORAL_TABLET | Freq: Every day | ORAL | Status: DC

## 2013-03-01 NOTE — Progress Notes (Signed)
**Note De-identified [place] Obfuscation** Pre visit review using our clinic review tool, if applicable. No additional management support is needed unless otherwise documented below in the visit note. 

## 2013-03-01 NOTE — Progress Notes (Signed)
**Note De-Identified [place] Obfuscation** Subjective:    Patient ID: Daniel Brock, male    DOB: July 05, 1945, 67 y.o.   MRN: [id number]  HPI Patient has [employer] and presents for CPX Fasting for labs History of gout hypertension and hyperlipidemia.  His allergies and recent past history was reviewed with him Medications her only TriCor for hyperlipidemia and Hytrin for benign prostatic hypertrophy he is currently not taking anything for gout no reports of any recent gouty attacks   Review of Systems  Constitutional: Positive for fatigue. Negative for fever.  HENT: Negative for congestion, hearing loss and postnasal drip.   Eyes: Negative for discharge, redness and visual disturbance.  Respiratory: Negative for cough, shortness of breath and wheezing.   Cardiovascular: Negative for leg swelling.  Gastrointestinal: Negative for abdominal pain, constipation and abdominal distention.  Genitourinary: Negative for urgency and frequency.  Musculoskeletal: Positive for gait problem, joint swelling, myalgias, neck pain and neck stiffness. Negative for arthralgias.  Skin: Negative for color change and rash.  Neurological: Positive for numbness. Negative for weakness and light-headedness.  Hematological: Negative for adenopathy.  Psychiatric/Behavioral: Negative for behavioral problems.       Objective:   Physical Exam  Nursing note and vitals reviewed. Constitutional: He is oriented to person, place, and time. He appears well-developed and well-nourished.  HENT:  Head: Normocephalic and atraumatic.  Eyes: Conjunctivae are normal. Pupils are equal, round, and reactive to light.  Neck: Normal range of motion. Neck supple.  Cardiovascular: Normal rate and regular rhythm.   Murmur heard. Pulmonary/Chest: Effort normal and breath sounds normal.  Abdominal: Soft. Bowel sounds are normal.  Genitourinary: Rectum normal.  BPH  Neurological: He is alert and oriented to person, place, and time. Coordination abnormal.  Right hand numbness No  change in grip          Assessment & Plan:   Past Medical History  Diagnosis Date  . Chronic kidney disease   . Hyperlipidemia   . Hiatal hernia   . Chronic leg pain   . Chronic foot pain   . Renal insufficiency   . Hypertension   . Allergy   . Gout     History   Social History  . Marital Status: Married    Spouse Name: N/A    Number of Children: N/A  . Years of Education: N/A   Occupational History  . Not on file.   Social History Main Topics  . Smoking status: Never Smoker   . Smokeless tobacco: Never Used  . Alcohol Use: No  . Drug Use: No  . Sexual Activity: Yes   Other Topics Concern  . Not on file   Social History Narrative  . No narrative on file    Past Surgical History  Procedure Laterality Date  . Inguinal hernia repair    . Colonoscopy w/ polypectomy    . Ulnar intrapment release  07/2008    Family History  Problem Relation Age of Onset  . Hypertension Mother   . Hyperlipidemia Mother   . Cancer Father     pancreatic cancer    Allergies  Allergen Reactions  . Ciprofloxacin Hcl     REACTION: NAUSIA AND VOMITING  . Sulfamethoxazole-Trimethoprim     REACTION: nausea and vomiting  . Sulfonamide Derivatives     REACTION: itching    Current Outpatient Prescriptions on File Prior to Visit  Medication Sig Dispense Refill  . fenofibrate (TRICOR) 145 MG tablet Take 145 mg by mouth daily.      Marland Kitchen **Note De-Identified [place]t Obfuscation** terazosin (HYTRIN) 2 MG capsule Take 1 capsule (2 mg total) by mouth at bedtime.  30 capsule  11   No current facility-administered medications on file prior to visit.    BP 130/82  Pulse 76  Temp(Src) 98.6 F (37 C)  Resp 16  Ht 5\' 11"  (1.803 m)  Wt 240 lb (108.863 kg)  BMI 33.49 kg/m2   Patient presents for yearly preventative medicine examination.   all immunizations and health maintenance protocols were reviewed with the patient and they are up to date with these protocols.   screening laboratory values were reviewed with the  patient including screening of hyperlipidemia PSA renal function and hepatic function.   There medications past medical history social history problem list and allergies were reviewed in detail.   Goals were established with regard to weight loss exercise diet in compliance with medications

## 2013-03-01 NOTE — Patient Instructions (Signed)
**Note De-Identified [place] Obfuscation** Weight loss  And exercise are keys to your health

## 2013-04-28 ENCOUNTER — Encounter: Payer: Self-pay | Admitting: Internal Medicine

## 2013-05-06 ENCOUNTER — Encounter: Payer: Self-pay | Admitting: Internal Medicine

## 2013-08-28 ENCOUNTER — Ambulatory Visit: Payer: BC Managed Care – PPO | Admitting: Internal Medicine

## 2013-08-30 ENCOUNTER — Encounter: Payer: Self-pay | Admitting: Internal Medicine

## 2013-08-30 ENCOUNTER — Ambulatory Visit: Payer: BC Managed Care – PPO | Admitting: Internal Medicine

## 2013-08-30 ENCOUNTER — Ambulatory Visit (INDEPENDENT_AMBULATORY_CARE_PROVIDER_SITE_OTHER): Payer: BC Managed Care – PPO | Admitting: Internal Medicine

## 2013-08-30 VITALS — BP 120/64 | HR 80 | Temp 98.2°F | Resp 20 | Ht 71.0 in | Wt 252.0 lb

## 2013-08-30 DIAGNOSIS — E7889 Other lipoprotein metabolism disorders: Secondary | ICD-10-CM

## 2013-08-30 DIAGNOSIS — I10 Essential (primary) hypertension: Secondary | ICD-10-CM

## 2013-08-30 DIAGNOSIS — E669 Obesity, unspecified: Secondary | ICD-10-CM | POA: Insufficient documentation

## 2013-08-30 DIAGNOSIS — E789 Disorder of lipoprotein metabolism, unspecified: Secondary | ICD-10-CM

## 2013-08-30 DIAGNOSIS — Z23 Encounter for immunization: Secondary | ICD-10-CM

## 2013-08-30 MED ORDER — FENOFIBRATE 145 MG PO TABS
145.0000 mg | ORAL_TABLET | Freq: Every day | ORAL | Status: DC
Start: 2013-08-30 — End: 2015-07-23

## 2013-08-30 MED ORDER — LISINOPRIL 10 MG PO TABS: 10.0000 mg | ORAL_TABLET | Freq: Every day | ORAL | Status: DC

## 2013-08-30 NOTE — Progress Notes (Signed)
**Note De-Identified [place] Obfuscation** Subjective:    Patient ID: Daniel Brock, male    DOB: Oct 25, 1945, 68 y.o.   MRN: [id number]  HPI Has not felt well with the hytrin due to dizzyness and whats to resume lisenopril Needs pneumonia shot  Review of Systems  Constitutional: Negative for fever and fatigue.  HENT: Negative for congestion, hearing loss and postnasal drip.   Eyes: Negative for discharge, redness and visual disturbance.  Respiratory: Negative for cough, shortness of breath and wheezing.   Cardiovascular: Negative for leg swelling.  Gastrointestinal: Negative for abdominal pain, constipation and abdominal distention.  Genitourinary: Negative for urgency and frequency.  Musculoskeletal: Negative for arthralgias, joint swelling and neck pain.  Skin: Negative for color change and rash.  Neurological: Negative for weakness and light-headedness.  Hematological: Negative for adenopathy.  Psychiatric/Behavioral: Negative for behavioral problems.       Past Medical History  Diagnosis Date  . Chronic kidney disease   . Hyperlipidemia   . Hiatal hernia   . Chronic leg pain   . Chronic foot pain   . Renal insufficiency   . Hypertension   . Allergy   . Gout     History   Social History  . Marital Status: Married    Spouse Name: N/A    Number of Children: N/A  . Years of Education: N/A   Occupational History  . Not on file.   Social History Main Topics  . Smoking status: Never Smoker   . Smokeless tobacco: Never Used  . Alcohol Use: No  . Drug Use: No  . Sexual Activity: Yes   Other Topics Concern  . Not on file   Social History Narrative  . No narrative on file    Past Surgical History  Procedure Laterality Date  . Inguinal hernia repair    . Colonoscopy w/ polypectomy    . Ulnar intrapment release  07/2008    Family History  Problem Relation Age of Onset  . Hypertension Mother   . Hyperlipidemia Mother   . Cancer Father     pancreatic cancer    Allergies  Allergen Reactions  .  Ciprofloxacin Hcl     REACTION: NAUSIA AND VOMITING  . Sulfamethoxazole-Trimethoprim     REACTION: nausea and vomiting  . Sulfonamide Derivatives     REACTION: itching    Current Outpatient Prescriptions on File Prior to Visit  Medication Sig Dispense Refill  . allopurinol (ZYLOPRIM) 300 MG tablet Take 1 tablet (300 mg total) by mouth daily.  30 tablet  6  . terazosin (HYTRIN) 2 MG capsule Take 1 capsule (2 mg total) by mouth at bedtime.  30 capsule  11   No current facility-administered medications on file prior to visit.    BP 120/64  Pulse 80  Temp(Src) 98.2 F (36.8 C) (Oral)  Resp 20  Ht 5\' 11"  (1.803 m)  Wt 252 lb (114.306 kg)  BMI 35.16 kg/m2  SpO2 96%     Objective:   Physical Exam  Constitutional: He appears well-developed and well-nourished.  HENT:  Head: Normocephalic and atraumatic.  Eyes: Conjunctivae are normal. Pupils are equal, round, and reactive to light.  Neck: Normal range of motion. Neck supple.  Cardiovascular: Normal rate and regular rhythm.   Pulmonary/Chest: Effort normal and breath sounds normal.  Abdominal: Soft. Bowel sounds are normal.          Assessment & Plan:  Gout  No reported attacks Lipids  Refused intervention Pneumovax today  HTN resume the **Note De-Identified [place] Obfuscation** lisenopril stop the hytrin   Wrist arthritis

## 2013-08-30 NOTE — Patient Instructions (Signed)
**Note De-identified Grajeda Obfuscation** The patient is instructed to continue all medications as prescribed. Schedule followup with check out clerk upon leaving the clinic  

## 2013-08-30 NOTE — Addendum Note (Signed)
**Note De-Identified Kady Obfuscation** Addended by: Marian Sorrow on: 08/30/2013 02:18 PM   Modules accepted: Orders

## 2013-08-30 NOTE — Progress Notes (Signed)
**Note De-identified Choe Obfuscation** Pre-visit discussion using our clinic review tool. No additional management support is needed unless otherwise documented below in the visit note.  

## 2013-10-03 ENCOUNTER — Encounter: Payer: Self-pay | Admitting: Internal Medicine

## 2014-01-09 ENCOUNTER — Encounter: Payer: Self-pay | Admitting: Internal Medicine

## 2014-03-21 DIAGNOSIS — F32A Depression, unspecified: Secondary | ICD-10-CM

## 2014-03-21 HISTORY — DX: Depression, unspecified: F32.A

## 2014-12-11 ENCOUNTER — Encounter: Payer: Self-pay | Admitting: Physician Assistant

## 2014-12-30 ENCOUNTER — Ambulatory Visit: Payer: Self-pay | Admitting: Physician Assistant

## 2015-07-23 ENCOUNTER — Ambulatory Visit (INDEPENDENT_AMBULATORY_CARE_PROVIDER_SITE_OTHER): Payer: BLUE CROSS/BLUE SHIELD

## 2015-07-23 ENCOUNTER — Ambulatory Visit (INDEPENDENT_AMBULATORY_CARE_PROVIDER_SITE_OTHER): Payer: BLUE CROSS/BLUE SHIELD | Admitting: Family Medicine

## 2015-07-23 ENCOUNTER — Encounter: Payer: Self-pay | Admitting: Family Medicine

## 2015-07-23 VITALS — BP 115/70 | HR 59 | Temp 97.8°F | Ht 71.0 in | Wt 253.4 lb

## 2015-07-23 DIAGNOSIS — R739 Hyperglycemia, unspecified: Secondary | ICD-10-CM | POA: Diagnosis not present

## 2015-07-23 DIAGNOSIS — I8312 Varicose veins of left lower extremity with inflammation: Secondary | ICD-10-CM

## 2015-07-23 DIAGNOSIS — M25562 Pain in left knee: Secondary | ICD-10-CM

## 2015-07-23 DIAGNOSIS — M109 Gout, unspecified: Secondary | ICD-10-CM

## 2015-07-23 DIAGNOSIS — E785 Hyperlipidemia, unspecified: Secondary | ICD-10-CM

## 2015-07-23 DIAGNOSIS — G894 Chronic pain syndrome: Secondary | ICD-10-CM | POA: Insufficient documentation

## 2015-07-23 DIAGNOSIS — I10 Essential (primary) hypertension: Secondary | ICD-10-CM

## 2015-07-23 LAB — BAYER DCA HB A1C WAIVED: HB A1C: 5.5 % (ref <7.0)

## 2015-07-23 MED ORDER — PITAVASTATIN CALCIUM 2 MG PO TABS: 2.0000 mg | ORAL_TABLET | Freq: Every day | ORAL | Status: DC

## 2015-07-23 MED ORDER — DULOXETINE HCL 60 MG PO CPEP: 60.0000 mg | ORAL_CAPSULE | Freq: Every day | ORAL | Status: DC

## 2015-07-23 NOTE — Addendum Note (Signed)
**Note De-Identified Marling Obfuscation** Addended by: Timmothy Euler on: 07/23/2015 05:35 PM   Modules accepted: Orders, SmartSet
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
## 2015-07-23 NOTE — Patient Instructions (Addendum)
**Note De-Identified Mangione Obfuscation** Great to meet you!  Lets see you back in 2 months   I have increased your cymbalta to 60 mg, I am hoping this will help your pain long term   pitavastatin is for cholesterol, it has the lowest rate of muscle aches

## 2015-07-24 LAB — CBC WITH DIFFERENTIAL/PLATELET
BASOS ABS: 0 10*3/uL (ref 0.0–0.2)
Basos: 1 %
EOS (ABSOLUTE): 0.1 10*3/uL (ref 0.0–0.4)
EOS: 3 %
HEMATOCRIT: 40.2 % (ref 37.5–51.0)
Hemoglobin: 13.1 g/dL (ref 12.6–17.7)
IMMATURE GRANULOCYTES: 0 %
Immature Grans (Abs): 0 10*3/uL (ref 0.0–0.1)
Lymphocytes Absolute: 1.1 10*3/uL (ref 0.7–3.1)
Lymphs: 27 %
MCH: 28.5 pg (ref 26.6–33.0)
MCHC: 32.6 g/dL (ref 31.5–35.7)
MCV: 87 fL (ref 79–97)
MONOS ABS: 0.3 10*3/uL (ref 0.1–0.9)
Monocytes: 7 %
NEUTROS PCT: 62 %
Neutrophils Absolute: 2.5 10*3/uL (ref 1.4–7.0)
PLATELETS: 150 10*3/uL (ref 150–379)
RBC: 4.6 x10E6/uL (ref 4.14–5.80)
RDW: 14.8 % (ref 12.3–15.4)
WBC: 4 10*3/uL (ref 3.4–10.8)

## 2015-07-24 LAB — CMP14+EGFR
ALK PHOS: 93 IU/L (ref 39–117)
ALT: 16 IU/L (ref 0–44)
AST: 15 IU/L (ref 0–40)
Albumin/Globulin Ratio: 1.8 (ref 1.2–2.2)
Albumin: 3.8 g/dL (ref 3.5–4.8)
BUN/Creatinine Ratio: 15 (ref 10–24)
BUN: 19 mg/dL (ref 8–27)
Bilirubin Total: 0.4 mg/dL (ref 0.0–1.2)
CHLORIDE: 107 mmol/L — AB (ref 96–106)
CO2: 24 mmol/L (ref 18–29)
CREATININE: 1.29 mg/dL — AB (ref 0.76–1.27)
Calcium: 8.9 mg/dL (ref 8.6–10.2)
GFR calc Af Amer: 64 mL/min/{1.73_m2} (ref >59)
GFR calc non Af Amer: 55 mL/min/{1.73_m2} — ABNORMAL LOW (ref >59)
GLUCOSE: 92 mg/dL (ref 65–99)
Globulin, Total: 2.1 g/dL (ref 1.5–4.5)
Potassium: 4.8 mmol/L (ref 3.5–5.2)
SODIUM: 147 mmol/L — AB (ref 134–144)
Total Protein: 5.9 g/dL — ABNORMAL LOW (ref 6.0–8.5)

## 2015-07-24 LAB — LIPID PANEL
CHOL/HDL RATIO: 5.1 ratio — AB (ref 0.0–5.0)
CHOLESTEROL TOTAL: 194 mg/dL (ref 100–199)
HDL: 38 mg/dL — AB (ref >39)
LDL Calculated: 112 mg/dL — ABNORMAL HIGH (ref 0–99)
TRIGLYCERIDES: 222 mg/dL — AB (ref 0–149)
VLDL Cholesterol Cal: 44 mg/dL — ABNORMAL HIGH (ref 5–40)

## 2015-07-28 ENCOUNTER — Other Ambulatory Visit: Payer: Self-pay | Admitting: Vascular Surgery

## 2015-07-28 DIAGNOSIS — M79605 Pain in left leg: Secondary | ICD-10-CM

## 2015-07-29 DIAGNOSIS — M25562 Pain in left knee: Secondary | ICD-10-CM | POA: Diagnosis not present

## 2015-07-29 DIAGNOSIS — Z471 Aftercare following joint replacement surgery: Secondary | ICD-10-CM | POA: Diagnosis not present

## 2015-07-29 DIAGNOSIS — M25561 Pain in right knee: Secondary | ICD-10-CM | POA: Diagnosis not present

## 2015-07-29 DIAGNOSIS — Z96651 Presence of right artificial knee joint: Secondary | ICD-10-CM | POA: Diagnosis not present

## 2015-08-13 DIAGNOSIS — Z6834 Body mass index (BMI) 34.0-34.9, adult: Secondary | ICD-10-CM | POA: Diagnosis not present

## 2015-08-13 DIAGNOSIS — M5412 Radiculopathy, cervical region: Secondary | ICD-10-CM | POA: Diagnosis not present

## 2015-08-19 ENCOUNTER — Other Ambulatory Visit: Payer: Self-pay | Admitting: Neurosurgery

## 2015-08-19 DIAGNOSIS — M5412 Radiculopathy, cervical region: Secondary | ICD-10-CM

## 2015-08-25 ENCOUNTER — Ambulatory Visit
Admission: RE | Admit: 2015-08-25 | Discharge: 2015-08-25 | Disposition: A | Discharge status: Home / Self Care | Payer: BLUE CROSS/BLUE SHIELD | Source: Ambulatory Visit | Attending: Neurosurgery | Admitting: Neurosurgery

## 2015-08-25 DIAGNOSIS — M5412 Radiculopathy, cervical region: Secondary | ICD-10-CM

## 2015-08-25 DIAGNOSIS — M50221 Other cervical disc displacement at C4-C5 level: Secondary | ICD-10-CM | POA: Diagnosis not present

## 2015-08-25 DIAGNOSIS — M50222 Other cervical disc displacement at C5-C6 level: Secondary | ICD-10-CM | POA: Diagnosis not present

## 2015-09-09 DIAGNOSIS — Z6835 Body mass index (BMI) 35.0-35.9, adult: Secondary | ICD-10-CM | POA: Diagnosis not present

## 2015-09-09 DIAGNOSIS — M5412 Radiculopathy, cervical region: Secondary | ICD-10-CM | POA: Diagnosis not present

## 2015-09-18 ENCOUNTER — Encounter: Payer: Self-pay | Admitting: Family Medicine

## 2015-09-18 ENCOUNTER — Ambulatory Visit (INDEPENDENT_AMBULATORY_CARE_PROVIDER_SITE_OTHER): Payer: BLUE CROSS/BLUE SHIELD | Admitting: Family Medicine

## 2015-09-18 VITALS — BP 125/75 | HR 66 | Temp 97.3°F | Ht 71.0 in | Wt 250.2 lb

## 2015-09-18 DIAGNOSIS — G894 Chronic pain syndrome: Secondary | ICD-10-CM

## 2015-09-18 DIAGNOSIS — M7022 Olecranon bursitis, left elbow: Secondary | ICD-10-CM | POA: Diagnosis not present

## 2015-09-18 MED ORDER — CYCLOBENZAPRINE HCL 10 MG PO TABS: 10.0000 mg | ORAL_TABLET | Freq: Three times a day (TID) | ORAL | Status: DC | PRN

## 2015-09-18 MED ORDER — PREGABALIN 75 MG PO CAPS: 75.0000 mg | ORAL_CAPSULE | Freq: Two times a day (BID) | ORAL | Status: DC
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
## 2015-09-18 NOTE — Patient Instructions (Signed)
**Note De-Identified Ramsaran Obfuscation** Great to see you!  I have started you on lyrica, this is a medication to take 2 times daily.   Flexeril is an as needed medication and may cause drowsiness

## 2015-09-24 ENCOUNTER — Ambulatory Visit: Payer: BLUE CROSS/BLUE SHIELD | Admitting: Family Medicine

## 2015-10-16 DIAGNOSIS — M4802 Spinal stenosis, cervical region: Secondary | ICD-10-CM | POA: Diagnosis not present

## 2015-10-16 DIAGNOSIS — G5603 Carpal tunnel syndrome, bilateral upper limbs: Secondary | ICD-10-CM | POA: Diagnosis not present

## 2015-10-16 DIAGNOSIS — G5622 Lesion of ulnar nerve, left upper limb: Secondary | ICD-10-CM | POA: Diagnosis not present

## 2015-10-23 ENCOUNTER — Encounter: Payer: Self-pay | Admitting: Nurse Practitioner

## 2015-10-23 ENCOUNTER — Ambulatory Visit (INDEPENDENT_AMBULATORY_CARE_PROVIDER_SITE_OTHER): Payer: BLUE CROSS/BLUE SHIELD

## 2015-10-23 ENCOUNTER — Ambulatory Visit (INDEPENDENT_AMBULATORY_CARE_PROVIDER_SITE_OTHER): Payer: BLUE CROSS/BLUE SHIELD | Admitting: Nurse Practitioner

## 2015-10-23 VITALS — BP 123/69 | HR 77 | Temp 97.8°F | Ht 71.0 in | Wt 254.8 lb

## 2015-10-23 DIAGNOSIS — R109 Unspecified abdominal pain: Secondary | ICD-10-CM | POA: Diagnosis not present

## 2015-10-23 LAB — URINALYSIS, COMPLETE
Bilirubin, UA: NEGATIVE
GLUCOSE, UA: NEGATIVE
KETONES UA: NEGATIVE
Leukocytes, UA: NEGATIVE
NITRITE UA: NEGATIVE
PROTEIN UA: NEGATIVE
SPEC GRAV UA: 1.02 (ref 1.005–1.030)
UUROB: 0.2 mg/dL (ref 0.2–1.0)
pH, UA: 5.5 (ref 5.0–7.5)

## 2015-10-23 LAB — MICROSCOPIC EXAMINATION
BACTERIA UA: NONE SEEN
Epithelial Cells (non renal): NONE SEEN /hpf (ref 0–10)

## 2015-10-23 MED ORDER — CYCLOBENZAPRINE HCL 10 MG PO TABS: 10.0000 mg | ORAL_TABLET | Freq: Three times a day (TID) | ORAL | 1 refills | Status: DC | PRN

## 2015-10-23 MED ORDER — METHYLPREDNISOLONE ACETATE 80 MG/ML IJ SUSP
80.0000 mg | Freq: Once | INTRAMUSCULAR | Status: AC
  Administered 2015-10-23: 80 mg via INTRAMUSCULAR

## 2015-10-23 NOTE — Progress Notes (Signed)
**Note De-identified Lainez Obfuscation**   **Note De-Identified Caffrey Obfuscation** Subjective:    Patient ID: Daniel Brock, male    DOB: [birth year], 70 y.o.   MRN: [id number]  HPI Patient here today c/o left flank pin- rates pain 5/10- has been constant- difficult to get comfortable- this morning he noticed blood in his urine- has history of kidney stones and this is not as severe. Noticed burning this morning. Has stabbing burning pain with movement.    Review of Systems  Constitutional: Negative.   HENT: Negative.   Respiratory: Negative.   Cardiovascular: Negative.   Gastrointestinal: Negative.   Genitourinary: Negative.   Neurological: Negative.   Psychiatric/Behavioral: Negative.   All other systems reviewed and are negative.      Objective:   Physical Exam  Constitutional: He is oriented to person, place, and time. He appears well-developed and well-nourished.  Cardiovascular: Normal rate, regular rhythm and normal heart sounds.   Pulmonary/Chest: Effort normal and breath sounds normal.  Neurological: He is alert and oriented to person, place, and time.  Skin: Skin is warm.  Psychiatric: He has a normal mood and affect. His behavior is normal. Judgment and thought content normal.   KUB negative-Preliminary reading by Ronnald Collum, FNP  [facility]  BP 123/69 (BP Location: Left Arm, Patient Position: Sitting, Cuff Size: Normal)   Pulse 77   Temp 97.8 F (36.6 C) (Oral)   Ht 5\' 11"  (1.803 m)   Wt 254 lb 12.8 oz (115.6 kg)   BMI 35.54 kg/m   Urine- trace amount of blood in urine.      Assessment & Plan:   1. Left flank pain    Meds ordered this encounter  Medications  . methylPREDNISolone acetate (DEPO-MEDROL) injection 80 mg  . cyclobenzaprine (FLEXERIL) 10 MG tablet    Sig: Take 1 tablet (10 mg total) by mouth 3 (three) times daily as needed for muscle spasms.    Dispense:  30 tablet    Refill:  1    Order Specific Question:   Supervising Provider    Answer:   Evette Doffing, CAROL L [[id number]]   Orders Placed This Encounter  Procedures  . Urine  culture    Order Specific Question:   Source    Answer:   urine  . DG Abd 1 View    Standing Status:   Future    Number of Occurrences:   1    Standing Expiration Date:   12/22/2016    Order Specific Question:   Reason for Exam (SYMPTOM  OR DIAGNOSIS REQUIRED)    Answer:   left flank pain    Order Specific Question:   Preferred imaging location?    Answer:   Internal  . Urinalysis, Complete   Rest Moist heat to area Force fluids Call or RTO if not improving  Mary-Margaret Hassell Done, FNP

## 2015-10-23 NOTE — Patient Instructions (Signed)
**Note De-identified Colton Obfuscation** Flank Pain °Flank pain refers to pain that is located on the side of the body between the upper abdomen and the back. The pain may occur over a short period of time (acute) or may be long-term or reoccurring (chronic). It may be mild or severe. Flank pain can be caused by many things. °CAUSES  °Some of the more common causes of flank pain include: °· Muscle strains.   °· Muscle spasms.   °· A disease of your spine (vertebral disk disease).   °· A lung infection (pneumonia).   °· Fluid around your lungs (pulmonary edema).   °· A kidney infection.   °· Kidney stones.   °· A very painful skin rash caused by the chickenpox virus (shingles).   °· Gallbladder disease.   °HOME CARE INSTRUCTIONS  °Home care will depend on the cause of your pain. In general, °· Rest as directed by your caregiver. °· Drink enough fluids to keep your urine clear or pale yellow. °· Only take over-the-counter or prescription medicines as directed by your caregiver. Some medicines may help relieve the pain. °· Tell your caregiver about any changes in your pain. °· Follow up with your caregiver as directed. °SEEK IMMEDIATE MEDICAL CARE IF:  °· Your pain is not controlled with medicine.   °· You have new or worsening symptoms. °· Your pain increases.   °· You have abdominal pain.   °· You have shortness of breath.   °· You have persistent nausea or vomiting.   °· You have swelling in your abdomen.   °· You feel faint or pass out.   °· You have blood in your urine. °· You have a fever or persistent symptoms for more than 2-3 days. °· You have a fever and your symptoms suddenly get worse. °MAKE SURE YOU:  °· Understand these instructions. °· Will watch your condition. °· Will get help right away if you are not doing well or get worse. °  °This information is not intended to replace advice given to you by your health care provider. Make sure you discuss any questions you have with your health care provider. °  °Document Released: 04/28/2005 Document  Revised: 11/30/2011 Document Reviewed: 10/20/2011 °Elsevier Interactive Patient Education ©2016 [employer] ° °

## 2015-10-23 NOTE — Addendum Note (Signed)
**Note De-Identified Lauver Obfuscation** Addended by: Chevis Pretty on: 10/23/2015 04:54 PM   Modules accepted: Orders

## 2015-10-25 LAB — URINE CULTURE

## 2015-10-26 ENCOUNTER — Other Ambulatory Visit: Payer: Self-pay | Admitting: Nurse Practitioner

## 2015-10-26 MED ORDER — CIPROFLOXACIN HCL 500 MG PO TABS: 500.0000 mg | ORAL_TABLET | Freq: Two times a day (BID) | ORAL | 0 refills | Status: DC

## 2015-10-26 MED ORDER — AMOXICILLIN-POT CLAVULANATE 875-125 MG PO TABS: 1.0000 | ORAL_TABLET | Freq: Two times a day (BID) | ORAL | 0 refills | Status: DC

## 2015-11-13 ENCOUNTER — Encounter: Payer: Self-pay | Admitting: Vascular Surgery

## 2015-11-18 ENCOUNTER — Ambulatory Visit (INDEPENDENT_AMBULATORY_CARE_PROVIDER_SITE_OTHER): Payer: BLUE CROSS/BLUE SHIELD | Admitting: Vascular Surgery

## 2015-11-18 ENCOUNTER — Encounter: Payer: Self-pay | Admitting: Vascular Surgery

## 2015-11-18 ENCOUNTER — Ambulatory Visit (HOSPITAL_COMMUNITY)
Admission: RE | Admit: 2015-11-18 | Discharge: 2015-11-18 | Disposition: A | Discharge status: Home / Self Care | Payer: BLUE CROSS/BLUE SHIELD | Source: Ambulatory Visit | Attending: Vascular Surgery | Admitting: Vascular Surgery

## 2015-11-18 VITALS — BP 120/73 | HR 62 | Temp 97.5°F | Resp 18 | Ht 70.75 in | Wt 258.2 lb

## 2015-11-18 DIAGNOSIS — I8392 Asymptomatic varicose veins of left lower extremity: Secondary | ICD-10-CM | POA: Insufficient documentation

## 2015-11-18 DIAGNOSIS — M79605 Pain in left leg: Secondary | ICD-10-CM | POA: Diagnosis not present

## 2015-11-18 DIAGNOSIS — I83812 Varicose veins of left lower extremities with pain: Secondary | ICD-10-CM | POA: Diagnosis not present

## 2015-11-18 NOTE — Progress Notes (Signed)
**Note De-Identified Munce Obfuscation** Patient name: Daniel Brock MRN: [id number] DOB: Jul 19, 1945 Sex: male  REASON FOR CONSULT: Painful varicose veins left lower extremity. Referred by Dr. Kenn File.  HPI: Daniel Brock is a 70 y.o. male, who is referred for evaluation of painful varicose veins of the left lower extremity. He underwent a previous vein stripping I believe in 1980 by Dr. Eustace Quail. He has had persistent pain in the left lower extremity and swelling. His symptoms are aggravated by standing and sitting. The symptoms are relieved with elevation. He denies any significant symptoms in the right lower extremity. He denies any previous history of DVT or phlebitis.  I do not get any history of claudication, rest pain, or nonhealing ulcers.  Past Medical History:  Diagnosis Date  . Allergy   . Anxiety   . Chronic foot pain   . Chronic kidney disease   . Chronic leg pain   . Depression 2016  . Gout   . Hiatal hernia   . Hyperlipidemia   . Hypertension   . Renal insufficiency   . Varicose veins     Family History  Problem Relation Age of Onset  . Hypertension Mother   . Hyperlipidemia Mother   . Arthritis Mother   . Heart disease Mother   . Huntington's disease Mother   . Cancer Father     pancreatic cancer  . Arthritis Father   . Diabetes Father   . Hypertension Father   . Hyperlipidemia Sister   . Hypertension Sister   . Hypertension Brother   . Vision loss Brother   . Huntington's disease Brother   . Heart disease Maternal Grandmother   . Early death Maternal Grandfather     SOCIAL HISTORY: Social History   Social History  . Marital status: Married    Spouse name: N/A  . Number of children: N/A  . Years of education: N/A   Occupational History  . Not on file.   Social History Main Topics  . Smoking status: Never Smoker  . Smokeless tobacco: Never Used  . Alcohol use No  . Drug use: No  . Sexual activity: No   Other Topics Concern  . Not on file   Social History Narrative    . No narrative on file    Allergies  Allergen Reactions  . Ciprofloxacin Nausea And Vomiting  . Ciprofloxacin Hcl     REACTION: NAUSIA AND VOMITING  . Sulfamethoxazole-Trimethoprim     REACTION: nausea and vomiting  . Sulfonamide Derivatives     REACTION: itching  . Sulphur [Sulfur] Rash    Current Outpatient Prescriptions  Medication Sig Dispense Refill  . allopurinol (ZYLOPRIM) 100 MG tablet Take 1 tablet by mouth 2 (two) times daily.    . cyclobenzaprine (FLEXERIL) 10 MG tablet Take 1 tablet (10 mg total) by mouth 3 (three) times daily as needed for muscle spasms. 30 tablet 0  . DULoxetine (CYMBALTA) 60 MG capsule Take 1 capsule (60 mg total) by mouth daily. 30 capsule 3  . lisinopril (PRINIVIL,ZESTRIL) 20 MG tablet     . Pitavastatin Calcium (LIVALO) 2 MG TABS Take 1 tablet (2 mg total) by mouth daily. 30 tablet 3  . pregabalin (LYRICA) 75 MG capsule Take 1 capsule (75 mg total) by mouth 2 (two) times daily. 60 capsule 2  . tamsulosin (FLOMAX) 0.4 MG CAPS capsule Take 1 capsule by mouth daily.    Marland Kitchen amoxicillin-clavulanate (AUGMENTIN) 875-125 MG tablet Take 1 tablet by mouth 2 (two) **Note De-Identified Ode Obfuscation** times daily. (Patient not taking: Reported on 11/18/2015) 20 tablet 0  . ciprofloxacin (CIPRO) 500 MG tablet Take 1 tablet (500 mg total) by mouth 2 (two) times daily. (Patient not taking: Reported on 11/18/2015) 20 tablet 0  . colchicine 0.6 MG tablet Take 1 tablet by mouth 2 (two) times daily. Reported on 09/18/2015    . cyclobenzaprine (FLEXERIL) 10 MG tablet Take 1 tablet (10 mg total) by mouth 3 (three) times daily as needed for muscle spasms. (Patient not taking: Reported on 11/18/2015) 30 tablet 1   No current facility-administered medications for this visit.     REVIEW OF SYSTEMS:  [X]  denotes positive finding, [ ]  denotes negative finding Cardiac  Comments:  Chest pain or chest pressure:    Shortness of breath upon exertion:    Short of breath when lying flat:    Irregular heart rhythm:         Vascular    Pain in calf, thigh, or hip brought on by ambulation:    Pain in feet at night that wakes you up from your sleep:     Blood clot in your veins:    Leg swelling:  X Left leg      Pulmonary    Oxygen at home:    Productive cough:     Wheezing:         Neurologic    Sudden weakness in arms or legs:     Sudden numbness in arms or legs:     Sudden onset of difficulty speaking or slurred speech:    Temporary loss of vision in one eye:     Problems with dizziness:         Gastrointestinal    Blood in stool:     Vomited blood:         Genitourinary    Burning when urinating:     Blood in urine:        Psychiatric    Major depression:         Hematologic    Bleeding problems:    Problems with blood clotting too easily:        Skin    Rashes or ulcers:        Constitutional    Fever or chills:      PHYSICAL EXAM: Vitals:   11/18/15 1326  BP: 120/73  Pulse: 62  Resp: 18  Temp: 97.5 F (36.4 C)  TempSrc: Oral  SpO2: 95%  Weight: 258 lb 3.2 oz (117.1 kg)  Height: 5' 10.75" (1.797 m)    GENERAL: The patient is a well-nourished male, in no acute distress. The vital signs are documented above. CARDIAC: There is a regular rate and rhythm.  VASCULAR: I do not detect carotid bruits. He has palpable femoral pulses bilaterally. On the right side he has a palpable dorsalis pedis and posterior tibial pulse. On the left side he has a palpable dorsalis pedis pulse. He has mild left lower extremity swelling. PULMONARY: There is good air exchange bilaterally without wheezing or rales. ABDOMEN: Soft and non-tender with normal pitched bowel sounds.  MUSCULOSKELETAL: There are no major deformities or cyanosis. NEUROLOGIC: No focal weakness or paresthesias are detected. SKIN: There are no ulcers or rashes noted. He does have some hyperpigmentation bilaterally. He has some large truncal varicosities along the medial aspect of his left thigh and left  leg. PSYCHIATRIC: The patient has a normal affect.  DATA:   LEFT LOWER EXTREMITY VENOUS DUPLEX: I have independently **Note De-Identified Vermeer Obfuscation** interpreted his venous duplex scan of the left lower extremity.  On the left side, there is no evidence of DVT or superficial thrombophlebitis. There is deep vein reflux involving the left common femoral vein. There is also reflux in the left saphenofemoral junction and also the left great saphenous vein in the thigh. The vein is significantly dilated.  MEDICAL ISSUES:  PAINFUL VARICOSE VEINS LEFT LOWER EXTREMITY: This patient has significant reflux in the great saphenous vein on the left and also some reflux in the common femoral vein. He has some large truncal varicosities along the medial aspect of his left thigh and left leg. He worked in the [employer] for many years and spent most of his time standing. We have discussed the importance of intermittent leg elevation the proper position for this. In addition I have written him a prescription for thigh-high compression stockings with a gradient of 20-30 mmHg. I have encouraged him to avoid prolonged sitting and standing. I have encouraged him to exercise as much as possible and also to consider water aerobics. I think this is very helpful for people with venous disease. I have him seen back in 3 months. If his symptoms have not improved and he could potentially be considered for laser ablation of the left great saphenous vein and stab phlebectomy of his large painful varicose veins.   Deitra Mayo [facility] [phone]

## 2015-11-19 DIAGNOSIS — G5603 Carpal tunnel syndrome, bilateral upper limbs: Secondary | ICD-10-CM | POA: Diagnosis not present

## 2015-11-19 DIAGNOSIS — Z6835 Body mass index (BMI) 35.0-35.9, adult: Secondary | ICD-10-CM | POA: Diagnosis not present

## 2015-11-27 DIAGNOSIS — Z96651 Presence of right artificial knee joint: Secondary | ICD-10-CM | POA: Diagnosis not present

## 2015-11-27 DIAGNOSIS — M7050 Other bursitis of knee, unspecified knee: Secondary | ICD-10-CM | POA: Diagnosis not present

## 2015-11-27 DIAGNOSIS — Z471 Aftercare following joint replacement surgery: Secondary | ICD-10-CM | POA: Diagnosis not present

## 2015-11-30 ENCOUNTER — Other Ambulatory Visit (HOSPITAL_COMMUNITY): Payer: Self-pay | Admitting: Orthopedic Surgery

## 2015-11-30 DIAGNOSIS — Z471 Aftercare following joint replacement surgery: Secondary | ICD-10-CM

## 2015-11-30 DIAGNOSIS — Z96651 Presence of right artificial knee joint: Secondary | ICD-10-CM

## 2015-12-11 ENCOUNTER — Encounter (HOSPITAL_COMMUNITY)
Admission: RE | Admit: 2015-12-11 | Discharge: 2015-12-11 | Disposition: A | Discharge status: Home / Self Care | Payer: BLUE CROSS/BLUE SHIELD | Source: Ambulatory Visit | Attending: Orthopedic Surgery | Admitting: Orthopedic Surgery

## 2015-12-11 DIAGNOSIS — Z471 Aftercare following joint replacement surgery: Secondary | ICD-10-CM | POA: Insufficient documentation

## 2015-12-11 DIAGNOSIS — Z96651 Presence of right artificial knee joint: Secondary | ICD-10-CM | POA: Diagnosis not present

## 2015-12-11 DIAGNOSIS — M7989 Other specified soft tissue disorders: Secondary | ICD-10-CM | POA: Diagnosis not present

## 2015-12-11 MED ORDER — TECHNETIUM TC 99M MEDRONATE IV KIT
19.6200 | PACK | Freq: Once | INTRAVENOUS | Status: AC | PRN
  Administered 2015-12-11: 19.62 via INTRAVENOUS

## 2015-12-21 ENCOUNTER — Ambulatory Visit: Payer: BLUE CROSS/BLUE SHIELD | Admitting: Family Medicine

## 2015-12-21 DIAGNOSIS — G5602 Carpal tunnel syndrome, left upper limb: Secondary | ICD-10-CM | POA: Diagnosis not present

## 2015-12-24 ENCOUNTER — Ambulatory Visit (INDEPENDENT_AMBULATORY_CARE_PROVIDER_SITE_OTHER): Payer: BLUE CROSS/BLUE SHIELD | Admitting: Family Medicine

## 2015-12-24 ENCOUNTER — Encounter: Payer: Self-pay | Admitting: Family Medicine

## 2015-12-24 VITALS — BP 127/76 | HR 76 | Temp 97.1°F | Ht 70.5 in | Wt 252.2 lb

## 2015-12-24 DIAGNOSIS — G894 Chronic pain syndrome: Secondary | ICD-10-CM | POA: Diagnosis not present

## 2015-12-24 DIAGNOSIS — E785 Hyperlipidemia, unspecified: Secondary | ICD-10-CM

## 2015-12-24 DIAGNOSIS — M109 Gout, unspecified: Secondary | ICD-10-CM | POA: Diagnosis not present

## 2015-12-24 DIAGNOSIS — I10 Essential (primary) hypertension: Secondary | ICD-10-CM

## 2015-12-24 MED ORDER — PITAVASTATIN CALCIUM 2 MG PO TABS: 2.0000 mg | ORAL_TABLET | Freq: Every day | ORAL | 3 refills | Status: DC

## 2015-12-24 MED ORDER — ALLOPURINOL 100 MG PO TABS
100.0000 mg | ORAL_TABLET | Freq: Two times a day (BID) | ORAL | 11 refills | Status: DC
Start: 2015-12-24 — End: 2017-03-23

## 2015-12-24 MED ORDER — PREGABALIN 75 MG PO CAPS: 75.0000 mg | ORAL_CAPSULE | Freq: Two times a day (BID) | ORAL | 5 refills | Status: DC

## 2015-12-24 MED ORDER — DULOXETINE HCL 30 MG PO CPEP: 30.0000 mg | ORAL_CAPSULE | Freq: Every day | ORAL | 11 refills | Status: DC

## 2015-12-24 NOTE — Progress Notes (Signed)
**Note De-identified Toner Obfuscation**   **Note De-Identified Brabender Obfuscation** HPI  Patient presents today here for follow-up chronic medical conditions.  Chronic pain and gout Patient with chronic left ulnar nerve neuropathy, carpal tunnel, gout, and back pain Lyrica is helping some Would like to continue Lyrica for a bit longer. Like to cut back on Cymbalta.  Patient is not fasting, would like his cholesterol checked again. Taking pitavistatin daily.  Needs a refill on allopurinol, is not taking colchicine Gout problems recently.  On a side note the patient states that his bilateral lower leg pain and knee pain had improved drastically after getting antibiotics for carpal tunnel procedure. He denies any signs or symptoms of chronic infection. He has follow-up with orthopedics coming up in about 1 week  PMH: Smoking status noted ROS: Per HPI  Objective: BP 127/76   Pulse 76   Temp 97.1 F (36.2 C) (Oral)   Ht 5' 10.5" (1.791 m)   Wt 252 lb 3.2 oz (114.4 kg)   BMI 35.68 kg/m  Gen: NAD, alert, cooperative with exam HEENT: NCAT, discordant gaze CV: RRR, good S1/S2, no murmur Resp: CTABL, no wheezes, non-labored Ext: Trace pitting bilateral edema, left lower extremity with severe varicose veins Neuro: Alert and oriented, No gross deficits  Assessment and plan:  # Chronic pain Controlled with Lyrica and Cymbalta, patient would like to titrate off of Cymbalta better overall, states the Cymbalta was primarily for anxiety initially, this is improving.  # Essential hypertension Well-controlled on current medications Continue Labs  # Gout Stable, controlled Continue allopurinol Avoiding colchicine with renal disease Also considered changing to Uloric based on labs- crcl 89 # HLD Rechecking labs, likely improved with pitavistatin Tolerating without myalgias    Orders Placed This Encounter  Procedures  . LDL Cholesterol, Direct  . CMP14+EGFR  . Uric acid  . CBC with Differential    Meds ordered this encounter  Medications  .  allopurinol (ZYLOPRIM) 100 MG tablet    Sig: Take 1 tablet (100 mg total) by mouth 2 (two) times daily.    Dispense:  60 tablet    Refill:  11  . DULoxetine (CYMBALTA) 30 MG capsule    Sig: Take 1 capsule (30 mg total) by mouth daily.    Dispense:  30 capsule    Refill:  11  . Pitavastatin Calcium (LIVALO) 2 MG TABS    Sig: Take 1 tablet (2 mg total) by mouth daily.    Dispense:  30 tablet    Refill:  3  . pregabalin (LYRICA) 75 MG capsule    Sig: Take 1 capsule (75 mg total) by mouth 2 (two) times daily.    Dispense:  60 capsule    Refill:  [place], MD [facility] 12/24/2015, 1:15 PM

## 2015-12-24 NOTE — Patient Instructions (Signed)
**Note De-Identified Beckel Obfuscation** Great to see you!  Come bcak in 4 months for follow up of high cholesterol and gout

## 2015-12-25 LAB — CMP14+EGFR
A/G RATIO: 2.3 — AB (ref 1.2–2.2)
ALT: 13 IU/L (ref 0–44)
AST: 13 IU/L (ref 0–40)
Albumin: 4.2 g/dL (ref 3.5–4.8)
Alkaline Phosphatase: 118 IU/L — ABNORMAL HIGH (ref 39–117)
BILIRUBIN TOTAL: 0.3 mg/dL (ref 0.0–1.2)
BUN/Creatinine Ratio: 19 (ref 10–24)
BUN: 21 mg/dL (ref 8–27)
CHLORIDE: 106 mmol/L (ref 96–106)
CO2: 23 mmol/L (ref 18–29)
Calcium: 9 mg/dL (ref 8.6–10.2)
Creatinine, Ser: 1.11 mg/dL (ref 0.76–1.27)
GFR calc non Af Amer: 66 mL/min/{1.73_m2} (ref >59)
GFR, EST AFRICAN AMERICAN: 77 mL/min/{1.73_m2} (ref >59)
GLOBULIN, TOTAL: 1.8 g/dL (ref 1.5–4.5)
Glucose: 157 mg/dL — ABNORMAL HIGH (ref 65–99)
POTASSIUM: 4.3 mmol/L (ref 3.5–5.2)
SODIUM: 148 mmol/L — AB (ref 134–144)
Total Protein: 6 g/dL (ref 6.0–8.5)

## 2015-12-25 LAB — CBC WITH DIFFERENTIAL/PLATELET
BASOS: 0 %
Basophils Absolute: 0 10*3/uL (ref 0.0–0.2)
EOS (ABSOLUTE): 0.1 10*3/uL (ref 0.0–0.4)
Eos: 1 %
HEMATOCRIT: 41.5 % (ref 37.5–51.0)
Hemoglobin: 13.7 g/dL (ref 12.6–17.7)
IMMATURE GRANULOCYTES: 1 %
Immature Grans (Abs): 0 10*3/uL (ref 0.0–0.1)
LYMPHS ABS: 1.1 10*3/uL (ref 0.7–3.1)
Lymphs: 22 %
MCH: 27.9 pg (ref 26.6–33.0)
MCHC: 33 g/dL (ref 31.5–35.7)
MCV: 85 fL (ref 79–97)
MONOS ABS: 0.2 10*3/uL (ref 0.1–0.9)
Monocytes: 4 %
NEUTROS PCT: 72 %
Neutrophils Absolute: 3.5 10*3/uL (ref 1.4–7.0)
PLATELETS: 129 10*3/uL — AB (ref 150–379)
RBC: 4.91 x10E6/uL (ref 4.14–5.80)
RDW: 15.5 % — AB (ref 12.3–15.4)
WBC: 5 10*3/uL (ref 3.4–10.8)

## 2015-12-25 LAB — URIC ACID: Uric Acid: 6.2 mg/dL (ref 3.7–8.6)

## 2015-12-25 LAB — LDL CHOLESTEROL, DIRECT: LDL DIRECT: 98 mg/dL (ref 0–99)

## 2015-12-29 ENCOUNTER — Telehealth: Payer: Self-pay | Admitting: Family Medicine

## 2015-12-29 ENCOUNTER — Other Ambulatory Visit: Payer: Self-pay | Admitting: Family Medicine

## 2015-12-29 LAB — SPECIMEN STATUS REPORT

## 2015-12-29 LAB — HGB A1C W/O EAG: Hgb A1c MFr Bld: 5.7 % — ABNORMAL HIGH (ref 4.8–5.6)

## 2015-12-29 MED ORDER — PREGABALIN 75 MG PO CAPS: 75.0000 mg | ORAL_CAPSULE | Freq: Two times a day (BID) | ORAL | 5 refills | Status: DC

## 2015-12-30 NOTE — Telephone Encounter (Signed)
**Note De-Identified Hineman Obfuscation** Refill written 1 day ago, should be up front.   Laroy Apple, MD [facility] 12/30/2015, 10:39 AM

## 2015-12-30 NOTE — Telephone Encounter (Signed)
**Note De-identified Rakers Obfuscation** Patient aware, script is ready. 

## 2015-12-31 DIAGNOSIS — Z471 Aftercare following joint replacement surgery: Secondary | ICD-10-CM | POA: Diagnosis not present

## 2015-12-31 DIAGNOSIS — Z96651 Presence of right artificial knee joint: Secondary | ICD-10-CM | POA: Diagnosis not present

## 2015-12-31 DIAGNOSIS — M7050 Other bursitis of knee, unspecified knee: Secondary | ICD-10-CM | POA: Diagnosis not present

## 2015-12-31 DIAGNOSIS — M25561 Pain in right knee: Secondary | ICD-10-CM | POA: Diagnosis not present

## 2016-02-05 DIAGNOSIS — G5601 Carpal tunnel syndrome, right upper limb: Secondary | ICD-10-CM | POA: Diagnosis not present

## 2016-02-23 ENCOUNTER — Ambulatory Visit: Payer: BLUE CROSS/BLUE SHIELD | Admitting: Vascular Surgery

## 2016-03-07 ENCOUNTER — Ambulatory Visit (INDEPENDENT_AMBULATORY_CARE_PROVIDER_SITE_OTHER): Payer: BLUE CROSS/BLUE SHIELD | Admitting: Family Medicine

## 2016-03-07 ENCOUNTER — Encounter: Payer: Self-pay | Admitting: Family Medicine

## 2016-03-07 VITALS — BP 131/79 | HR 62 | Temp 97.6°F | Ht 70.5 in | Wt 261.2 lb

## 2016-03-07 DIAGNOSIS — Z0000 Encounter for general adult medical examination without abnormal findings: Secondary | ICD-10-CM

## 2016-03-07 DIAGNOSIS — E785 Hyperlipidemia, unspecified: Secondary | ICD-10-CM | POA: Diagnosis not present

## 2016-03-07 DIAGNOSIS — Z23 Encounter for immunization: Secondary | ICD-10-CM

## 2016-03-07 DIAGNOSIS — N400 Enlarged prostate without lower urinary tract symptoms: Secondary | ICD-10-CM

## 2016-03-07 DIAGNOSIS — F411 Generalized anxiety disorder: Secondary | ICD-10-CM | POA: Insufficient documentation

## 2016-03-07 MED ORDER — SERTRALINE HCL 100 MG PO TABS: 100.0000 mg | ORAL_TABLET | Freq: Every day | ORAL | 3 refills | Status: DC

## 2016-03-07 MED ORDER — LISINOPRIL 20 MG PO TABS: 20.0000 mg | ORAL_TABLET | Freq: Every day | ORAL | 3 refills | Status: DC

## 2016-03-07 MED ORDER — CYCLOBENZAPRINE HCL 10 MG PO TABS: 10.0000 mg | ORAL_TABLET | Freq: Three times a day (TID) | ORAL | 0 refills | Status: DC | PRN

## 2016-03-07 MED ORDER — TAMSULOSIN HCL 0.4 MG PO CAPS: 0.4000 mg | ORAL_CAPSULE | Freq: Every day | ORAL | 3 refills | Status: DC

## 2016-03-07 MED ORDER — PITAVASTATIN CALCIUM 2 MG PO TABS: 2.0000 mg | ORAL_TABLET | Freq: Every day | ORAL | 3 refills | Status: DC

## 2016-03-07 NOTE — Progress Notes (Signed)
**Note De-identified Daniel Brock**   **Note De-Identified Daniel Brock** HPI  Patient presents today for an annual physical exam.  He has no complaints.  He needs a refill of Flexeril, pitavistatin, Flomax  Patient agrees to have Prevnar injection today, he does not want a colonoscopy. He will have an FOBT.  He has been going through increased stress with his brother lately, his brother has Huntington's disease and has recently been admitted for psychotic type reactions. The patient states that Cymbalta is helping but not completely, his wife is taking Zoloft and he would like to try that. No suicidal thoughts.  Hyperlipidemia Tolerating Livalo well, would like repeat labs today.  BPH is helped very well by Flomax, needs refills  Flexeril used when necessary  PMH: Smoking status noted ROS: Per HPI  Objective: BP 131/79   Pulse 62   Temp 97.6 F (36.4 C) (Oral)   Ht 5' 10.5" (1.791 m)   Wt 261 lb 3.2 oz (118.5 kg)   BMI 36.95 kg/m  Gen: NAD, alert, cooperative with exam HEENT: NCAT, discordant gaze, proptosis on the left, right eye is up words and medially discordant compared to the left. Oral pharynx is moist and clear. CV: RRR, good S1/S2, no murmur Resp: CTABL, no wheezes, non-labored Abd: SNTND, BS present, no guarding or organomegaly Ext: No edema, warm Neuro: Alert and oriented, strength 5/5 in the right lower extremity with normal sensation in the right lower and left lower extremity, strength left lower extremity is 4/5, negative straight leg raise  Assessment and plan:  # Annual physical exam Normal exam except for discordant gaze and long-term medical problems. Labs today Prevnar injection given, counseling provided for all immunization components FOBT card given, declines colonoscopy  # Hyperlipidemia Repeating labs, it appears that he started this medication about 2 months ago Tolerating easily.  Anxiety Changing from Cymbalta to Zoloft Start with 50 mg once daily, increase to '100mg'$  after 2 weeks Follow-up one  month  BPH - stable on flomax, refilled   Orders Placed This Encounter  Procedures  . LDL Cholesterol, Direct  . CMP14+EGFR  . Hepatitis C antibody    Meds ordered this encounter  Medications  . lisinopril (PRINIVIL,ZESTRIL) 20 MG tablet    Sig: Take 1 tablet (20 mg total) by mouth daily.    Dispense:  90 tablet    Refill:  3  . tamsulosin (FLOMAX) 0.4 MG CAPS capsule    Sig: Take 1 capsule (0.4 mg total) by mouth daily.    Dispense:  90 capsule    Refill:  3  . Pitavastatin Calcium (LIVALO) 2 MG TABS    Sig: Take 1 tablet (2 mg total) by mouth daily.    Dispense:  90 tablet    Refill:  3  . sertraline (ZOLOFT) 100 MG tablet    Sig: Take 1 tablet (100 mg total) by mouth daily.    Dispense:  90 tablet    Refill:  3  . cyclobenzaprine (FLEXERIL) 10 MG tablet    Sig: Take 1 tablet (10 mg total) by mouth 3 (three) times daily as needed for muscle spasms.    Dispense:  30 tablet    Refill:  0    Laroy Apple, MD [facility] 03/07/2016, 11:51 AM

## 2016-03-07 NOTE — Patient Instructions (Addendum)
**Note De-Identified Nellums Obfuscation** Great to see you!  Consider a colonoscopy  Come back in in about 4-6 months  I have chnaged you from cymbalta to zoloft, you can start zoloft (1/2 pill daily for 2 weeks then increase to 1 pill daily) the day after you rlast cymbalta dose.

## 2016-03-08 LAB — CMP14+EGFR
ALK PHOS: 118 IU/L — AB (ref 39–117)
ALT: 16 IU/L (ref 0–44)
AST: 14 IU/L (ref 0–40)
Albumin/Globulin Ratio: 2.2 (ref 1.2–2.2)
Albumin: 4.4 g/dL (ref 3.5–4.8)
BILIRUBIN TOTAL: 0.4 mg/dL (ref 0.0–1.2)
BUN/Creatinine Ratio: 19 (ref 10–24)
BUN: 23 mg/dL (ref 8–27)
CHLORIDE: 105 mmol/L (ref 96–106)
CO2: 26 mmol/L (ref 18–29)
Calcium: 8.9 mg/dL (ref 8.6–10.2)
Creatinine, Ser: 1.22 mg/dL (ref 0.76–1.27)
GFR calc Af Amer: 69 mL/min/{1.73_m2} (ref >59)
GFR calc non Af Amer: 59 mL/min/{1.73_m2} — ABNORMAL LOW (ref >59)
GLUCOSE: 99 mg/dL (ref 65–99)
Globulin, Total: 2 g/dL (ref 1.5–4.5)
Potassium: 4.6 mmol/L (ref 3.5–5.2)
Sodium: 145 mmol/L — ABNORMAL HIGH (ref 134–144)
Total Protein: 6.4 g/dL (ref 6.0–8.5)

## 2016-03-08 LAB — PSA: Prostate Specific Ag, Serum: 2.1 ng/mL (ref 0.0–4.0)

## 2016-03-08 LAB — LDL CHOLESTEROL, DIRECT: LDL DIRECT: 93 mg/dL (ref 0–99)

## 2016-03-08 LAB — HEPATITIS C ANTIBODY

## 2016-03-10 ENCOUNTER — Encounter: Payer: Self-pay | Admitting: Family Medicine

## 2016-04-07 ENCOUNTER — Ambulatory Visit: Payer: BLUE CROSS/BLUE SHIELD | Admitting: Family Medicine

## 2016-05-19 DIAGNOSIS — Z96651 Presence of right artificial knee joint: Secondary | ICD-10-CM | POA: Diagnosis not present

## 2016-05-19 DIAGNOSIS — Z471 Aftercare following joint replacement surgery: Secondary | ICD-10-CM | POA: Diagnosis not present

## 2016-05-27 ENCOUNTER — Encounter: Payer: Self-pay | Admitting: Nurse Practitioner

## 2016-05-27 ENCOUNTER — Ambulatory Visit (INDEPENDENT_AMBULATORY_CARE_PROVIDER_SITE_OTHER): Payer: BLUE CROSS/BLUE SHIELD | Admitting: Nurse Practitioner

## 2016-05-27 VITALS — BP 117/72 | HR 80 | Temp 99.2°F | Ht 70.0 in | Wt 253.0 lb

## 2016-05-27 DIAGNOSIS — J40 Bronchitis, not specified as acute or chronic: Secondary | ICD-10-CM

## 2016-05-27 MED ORDER — HYDROCODONE-HOMATROPINE 5-1.5 MG/5ML PO SYRP: 5.0000 mL | ORAL_SOLUTION | Freq: Four times a day (QID) | ORAL | 0 refills | Status: DC | PRN

## 2016-05-27 MED ORDER — PREDNISONE 20 MG PO TABS: ORAL_TABLET | ORAL | 0 refills | Status: DC

## 2016-05-27 MED ORDER — AZITHROMYCIN 250 MG PO TABS: ORAL_TABLET | ORAL | 0 refills | Status: DC

## 2016-05-27 NOTE — Patient Instructions (Signed)
**Note De-identified Marti Obfuscation** Acute Bronchitis, Adult Acute bronchitis is when air tubes (bronchi) in the lungs suddenly get swollen. The condition can make it hard to breathe. It can also cause these symptoms:  A cough.  Coughing up clear, yellow, or green mucus.  Wheezing.  Chest congestion.  Shortness of breath.  A fever.  Body aches.  Chills.  A sore throat.  Follow these instructions at home: Medicines  Take over-the-counter and prescription medicines only as told by your doctor.  If you were prescribed an antibiotic medicine, take it as told by your doctor. Do not stop taking the antibiotic even if you start to feel better. General instructions  Rest.  Drink enough fluids to keep your pee (urine) clear or pale yellow.  Avoid smoking and secondhand smoke. If you smoke and you need help quitting, ask your doctor. Quitting will help your lungs heal faster.  Use an inhaler, cool mist vaporizer, or humidifier as told by your doctor.  Keep all follow-up visits as told by your doctor. This is important. How is this prevented? To lower your risk of getting this condition again:  Wash your hands often with soap and water. If you cannot use soap and water, use hand sanitizer.  Avoid contact with people who have cold symptoms.  Try not to touch your hands to your mouth, nose, or eyes.  Make sure to get the flu shot every year.  Contact a doctor if:  Your symptoms do not get better in 2 weeks. Get help right away if:  You cough up blood.  You have chest pain.  You have very bad shortness of breath.  You become dehydrated.  You faint (pass out) or keep feeling like you are going to pass out.  You keep throwing up (vomiting).  You have a very bad headache.  Your fever or chills gets worse. This information is not intended to replace advice given to you by your health care provider. Make sure you discuss any questions you have with your health care provider. Document Released:  08/24/2007 Document Revised: 10/14/2015 Document Reviewed: 08/26/2015 Elsevier Interactive Patient Education  2017 Elsevier Inc.  

## 2016-05-27 NOTE — Progress Notes (Signed)
**Note De-identified Bartosiewicz Obfuscation**   **Note De-Identified Yaden Obfuscation** Subjective:    Patient ID: Daniel Brock, male    DOB: Feb 22, 1946, 71 y.o.   MRN: [id number]  HPI Patient comes in today c/o cough that started 3 days ago- has gotten worse. Did not sleep at all last night due to cough. No fever.    Review of Systems  Constitutional: Positive for fever (low grade last night). Negative for chills.  HENT: Positive for congestion and rhinorrhea. Negative for ear pain, sinus pain, sinus pressure, sore throat, trouble swallowing and voice change.   Respiratory: Positive for cough. Negative for shortness of breath.   Cardiovascular: Negative.   Gastrointestinal: Negative.   Musculoskeletal: Negative.   Neurological: Negative.   Psychiatric/Behavioral: Negative.   All other systems reviewed and are negative.      Objective:   Physical Exam  Constitutional: He is oriented to person, place, and time. He appears well-developed and well-nourished. No distress.  HENT:  Right Ear: Hearing, tympanic membrane, external ear and ear canal normal.  Left Ear: Hearing, tympanic membrane, external ear and ear canal normal.  Nose: Mucosal edema and rhinorrhea present. Right sinus exhibits no maxillary sinus tenderness and no frontal sinus tenderness. Left sinus exhibits no maxillary sinus tenderness and no frontal sinus tenderness.  Mouth/Throat: Uvula is midline, oropharynx is clear and moist and mucous membranes are normal.  Neck: Normal range of motion. Neck supple.  Cardiovascular: Normal rate and regular rhythm.   Pulmonary/Chest: Effort normal and breath sounds normal.  Deep tight cough  Lymphadenopathy:    He has no cervical adenopathy.  Neurological: He is alert and oriented to person, place, and time.  Skin: Skin is warm.  Psychiatric: He has a normal mood and affect. His behavior is normal. Judgment and thought content normal.   BP 117/72   Pulse 80   Temp 99.2 F (37.3 C) (Oral)   Ht 5\' 10"  (1.778 m)   Wt 253 lb (114.8 kg)   BMI 36.30 kg/m         Assessment & Plan:  1. Bronchitis 1. Take meds as prescribed 2. Use a cool mist humidifier especially during the winter months and when heat has been humid. 3. Use saline nose sprays frequently 4. Saline irrigations of the nose can be very helpful if done frequently.  * 4X daily for 1 week*  * Use of a nettie pot can be helpful with this. Follow directions with this* 5. Drink plenty of fluids 6. Keep thermostat turn down low 7.For any cough or congestion  Use plain Mucinex- regular strength or max strength is fine   * Children- consult with Pharmacist for dosing 8. For fever or aces or pains- take tylenol or ibuprofen appropriate for age and weight.  * for fevers greater than 101 orally you may alternate ibuprofen and tylenol every  3 hours.   - azithromycin (ZITHROMAX Z-PAK) 250 MG tablet; As directed  Dispense: 6 tablet; Refill: 0 - HYDROcodone-homatropine (HYCODAN) 5-1.5 MG/5ML syrup; Take 5 mLs by mouth every 6 (six) hours as needed for cough.  Dispense: 120 mL; Refill: 0 - predniSONE (DELTASONE) 20 MG tablet; 2 po at sametime daily for 5 days  Dispense: 10 tablet; Refill: 0  Mary-Margaret Hassell Done, FNP

## 2016-06-27 ENCOUNTER — Encounter: Payer: Self-pay | Admitting: Family Medicine

## 2016-06-27 ENCOUNTER — Ambulatory Visit (INDEPENDENT_AMBULATORY_CARE_PROVIDER_SITE_OTHER): Payer: BLUE CROSS/BLUE SHIELD | Admitting: Family Medicine

## 2016-06-27 VITALS — BP 122/86 | HR 72 | Temp 96.7°F | Ht 70.0 in | Wt 257.0 lb

## 2016-06-27 DIAGNOSIS — L259 Unspecified contact dermatitis, unspecified cause: Secondary | ICD-10-CM | POA: Insufficient documentation

## 2016-06-27 DIAGNOSIS — L249 Irritant contact dermatitis, unspecified cause: Secondary | ICD-10-CM | POA: Diagnosis not present

## 2016-06-27 DIAGNOSIS — E785 Hyperlipidemia, unspecified: Secondary | ICD-10-CM

## 2016-06-27 DIAGNOSIS — R631 Polydipsia: Secondary | ICD-10-CM

## 2016-06-27 DIAGNOSIS — R42 Dizziness and giddiness: Secondary | ICD-10-CM

## 2016-06-27 LAB — BAYER DCA HB A1C WAIVED: HB A1C: 5.9 % (ref <7.0)

## 2016-06-27 MED ORDER — TRIAMCINOLONE ACETONIDE 40 MG/ML IJ SUSP
40.0000 mg | Freq: Once | INTRAMUSCULAR | Status: AC
  Administered 2016-06-27: 40 mg via INTRAMUSCULAR

## 2016-06-27 NOTE — Progress Notes (Signed)
**Note De-identified Coffelt Obfuscation**   **Note De-Identified Niess Obfuscation** HPI  Patient presents today here for follow-up chronic medical conditions and with rash.  Patient explains he's had poison that exposure the last 3 days, he's had previous bouts of severe rash with this.  He has rash on his left arm, back, and down his leg, patient is concerned about developing groin rash.  Patient complains of increased thirst and urination lately. He's a strong family history of type 2 diabetes.  Hyperlipidemia Good medication compliance.  Dizziness Patient with fullness feeling in right ear with what is described as room spinning sensation when he gets out of bed several days a week. At times he describes "blacking out" with his vision for about 30 seconds, after holding onto the bedpost for about 30 seconds passes. He denies history of stroke. He denies amaurosis or episodes of decreased vision not associated with dizziness.  PMH: Smoking status noted ROS: Per HPI  Objective: BP 122/86   Pulse 72   Temp (!) 96.7 F (35.9 C) (Oral)   Ht '5\' 10"'$  (1.778 m)   Wt 257 lb (116.6 kg)   BMI 36.88 kg/m  Gen: NAD, alert, cooperative with exam HEENT: NCAT, discoordinate gaze, right TM obscured by cerumen with unsuccessful irrigation in clinic, left TM partially obscured by cerumen CV: RRR, good S1/S2, no murmur Resp: CTABL, no wheezes, non-labored Ext: No edema, warm Neuro: Alert and oriented, No gross deficits  Rash  Vesicular rash on the left upper arm consistent with contact dermatitis  Assessment and plan:  # Contact dermatitis Given IM Kenalog in clinic today Discussed three-week course of prednisone, he would prefer IM injection  # Hyperlipidemia Labs, continue statin  # Polydipsia Labs to eval for diabetes, previously A1c 5.7  # Dizziness Symptoms consistent with vertigo, he does have ear fullness however his TM was not visible today Treat with debrox and return for ear irrigation in 2-3 weeks Given symptoms of vision loss are recommended  neurological evaluation, referral written    Orders Placed This Encounter  Procedures  . CMP14+EGFR  . Lipid panel  . Bayer DCA Hb A1c Waived  . Ambulatory referral to Neurology    Referral Priority:   Routine    Referral Type:   Consultation    Referral Reason:   Specialty Services Required    Requested Specialty:   Neurology    Number of Visits Requested:   1    Meds ordered this encounter  Medications  . triamcinolone acetonide (KENALOG-40) injection 40 mg    Laroy Apple, MD [facility] 06/27/2016, 12:15 PM

## 2016-06-27 NOTE — Patient Instructions (Signed)
**Note De-Identified Littlefield Obfuscation** Great to see you!  You will be called for a neurology appointment  We will call with labs within 1 week.

## 2016-06-28 ENCOUNTER — Encounter: Payer: Self-pay | Admitting: Neurology

## 2016-06-28 ENCOUNTER — Other Ambulatory Visit: Payer: Self-pay

## 2016-06-28 DIAGNOSIS — E782 Mixed hyperlipidemia: Secondary | ICD-10-CM

## 2016-06-28 LAB — CMP14+EGFR
ALT: 20 IU/L (ref 0–44)
AST: 14 IU/L (ref 0–40)
Albumin/Globulin Ratio: 2.1 (ref 1.2–2.2)
Albumin: 4.1 g/dL (ref 3.5–4.8)
Alkaline Phosphatase: 105 IU/L (ref 39–117)
BUN/Creatinine Ratio: 17 (ref 10–24)
BUN: 19 mg/dL (ref 8–27)
Bilirubin Total: 0.4 mg/dL (ref 0.0–1.2)
CALCIUM: 9 mg/dL (ref 8.6–10.2)
CO2: 26 mmol/L (ref 18–29)
Chloride: 104 mmol/L (ref 96–106)
Creatinine, Ser: 1.14 mg/dL (ref 0.76–1.27)
GFR, EST AFRICAN AMERICAN: 74 mL/min/{1.73_m2} (ref >59)
GFR, EST NON AFRICAN AMERICAN: 64 mL/min/{1.73_m2} (ref >59)
GLOBULIN, TOTAL: 2 g/dL (ref 1.5–4.5)
GLUCOSE: 108 mg/dL — AB (ref 65–99)
Potassium: 4.6 mmol/L (ref 3.5–5.2)
SODIUM: 144 mmol/L (ref 134–144)
TOTAL PROTEIN: 6.1 g/dL (ref 6.0–8.5)

## 2016-06-28 LAB — LIPID PANEL
CHOL/HDL RATIO: 7.4 ratio — AB (ref 0.0–5.0)
CHOLESTEROL TOTAL: 244 mg/dL — AB (ref 100–199)
HDL: 33 mg/dL — ABNORMAL LOW (ref >39)
Triglycerides: 534 mg/dL — ABNORMAL HIGH (ref 0–149)

## 2016-06-30 LAB — LDL CHOLESTEROL, DIRECT: LDL DIRECT: 108 mg/dL — AB (ref 0–99)

## 2016-06-30 LAB — SPECIMEN STATUS REPORT

## 2016-07-11 ENCOUNTER — Ambulatory Visit: Payer: BLUE CROSS/BLUE SHIELD | Admitting: Family Medicine

## 2016-07-14 ENCOUNTER — Ambulatory Visit (INDEPENDENT_AMBULATORY_CARE_PROVIDER_SITE_OTHER): Payer: BLUE CROSS/BLUE SHIELD | Admitting: Family Medicine

## 2016-07-14 ENCOUNTER — Encounter: Payer: Self-pay | Admitting: Family Medicine

## 2016-07-14 VITALS — BP 128/77 | HR 71 | Temp 97.3°F | Ht 70.0 in | Wt 250.6 lb

## 2016-07-14 DIAGNOSIS — H6123 Impacted cerumen, bilateral: Secondary | ICD-10-CM | POA: Diagnosis not present

## 2016-07-14 NOTE — Progress Notes (Signed)
**Note De-identified Laton Obfuscation**   **Note De-Identified Dantuono Obfuscation** HPI  Patient presents today here for reevaluation of his ear.  Patient was found to have impacted cerumen about 2 weeks ago. He's been applying peroxide drops since that time. 10 he is to have intermittent symptoms of dizziness and ear fullness. His hearing is not quite normal.  He denies fever, chills, sweats. He has had difficulty with cerumen impactions many times previously. He states that ear plugs seem to exacerbate the issue  PMH: Smoking status noted ROS: Per HPI  Objective: BP 128/77   Pulse 71   Temp 97.3 F (36.3 C) (Oral)   Ht 5\' 10"  (1.778 m)   Wt 250 lb 9.6 oz (113.7 kg)   BMI 35.96 kg/m  Gen: NAD, alert, cooperative with exam HEENT: NCAT, TMs obscured by cerumen bilaterally CV: RRR, good S1/S2, no murmur Resp: CTABL, no wheezes, non-labored Neuro: Alert and oriented, No gross deficits  After aggressive irrigation BL and removal of earwax, large amount, from the (left > R)  ear under direct visualization he has partial visualization of his right TM which appears normal and limited visibility of the left TM.  Assessment and plan:  # Impacted cerumen Improvement after irrigation, however not complete resolution. She will return as needed, recommended avoiding earplugs and using earmuffs instead for noise reduction. Offered ENT referral the patient declines at this time   Laroy Apple, MD [facility] 07/14/2016, 5:28 PM

## 2016-09-02 ENCOUNTER — Ambulatory Visit: Payer: BLUE CROSS/BLUE SHIELD | Admitting: Neurology

## 2016-10-13 ENCOUNTER — Ambulatory Visit: Payer: BLUE CROSS/BLUE SHIELD | Admitting: Family Medicine

## 2016-10-26 ENCOUNTER — Encounter: Payer: Self-pay | Admitting: Internal Medicine

## 2016-10-26 ENCOUNTER — Ambulatory Visit (INDEPENDENT_AMBULATORY_CARE_PROVIDER_SITE_OTHER): Payer: BLUE CROSS/BLUE SHIELD | Admitting: Family Medicine

## 2016-10-26 ENCOUNTER — Encounter: Payer: Self-pay | Admitting: Family Medicine

## 2016-10-26 VITALS — BP 138/86 | HR 55 | Temp 96.9°F | Ht 70.0 in | Wt 245.4 lb

## 2016-10-26 DIAGNOSIS — N400 Enlarged prostate without lower urinary tract symptoms: Secondary | ICD-10-CM | POA: Diagnosis not present

## 2016-10-26 DIAGNOSIS — Z860100 Personal history of colon polyps, unspecified: Secondary | ICD-10-CM

## 2016-10-26 DIAGNOSIS — E781 Pure hyperglyceridemia: Secondary | ICD-10-CM | POA: Diagnosis not present

## 2016-10-26 DIAGNOSIS — R7303 Prediabetes: Secondary | ICD-10-CM | POA: Diagnosis not present

## 2016-10-26 DIAGNOSIS — I10 Essential (primary) hypertension: Secondary | ICD-10-CM

## 2016-10-26 LAB — CMP14+EGFR
ALK PHOS: 98 IU/L (ref 39–117)
ALT: 15 IU/L (ref 0–44)
AST: 14 IU/L (ref 0–40)
Albumin/Globulin Ratio: 2.5 — ABNORMAL HIGH (ref 1.2–2.2)
Albumin: 4.2 g/dL (ref 3.5–4.8)
BUN/Creatinine Ratio: 17 (ref 10–24)
BUN: 22 mg/dL (ref 8–27)
Bilirubin Total: 0.4 mg/dL (ref 0.0–1.2)
CALCIUM: 8.8 mg/dL (ref 8.6–10.2)
CO2: 24 mmol/L (ref 20–29)
CREATININE: 1.31 mg/dL — AB (ref 0.76–1.27)
Chloride: 104 mmol/L (ref 96–106)
GFR calc Af Amer: 62 mL/min/{1.73_m2} (ref >59)
GFR calc non Af Amer: 54 mL/min/{1.73_m2} — ABNORMAL LOW (ref >59)
GLOBULIN, TOTAL: 1.7 g/dL (ref 1.5–4.5)
GLUCOSE: 106 mg/dL — AB (ref 65–99)
Potassium: 4.8 mmol/L (ref 3.5–5.2)
SODIUM: 144 mmol/L (ref 134–144)
Total Protein: 5.9 g/dL — ABNORMAL LOW (ref 6.0–8.5)

## 2016-10-26 LAB — CBC WITH DIFFERENTIAL/PLATELET
BASOS ABS: 0 10*3/uL (ref 0.0–0.2)
Basos: 0 %
EOS (ABSOLUTE): 0.1 10*3/uL (ref 0.0–0.4)
EOS: 2 %
HEMOGLOBIN: 14.2 g/dL (ref 13.0–17.7)
Hematocrit: 42.9 % (ref 37.5–51.0)
IMMATURE GRANS (ABS): 0 10*3/uL (ref 0.0–0.1)
IMMATURE GRANULOCYTES: 0 %
LYMPHS: 28 %
Lymphocytes Absolute: 1.1 10*3/uL (ref 0.7–3.1)
MCH: 28.2 pg (ref 26.6–33.0)
MCHC: 33.1 g/dL (ref 31.5–35.7)
MCV: 85 fL (ref 79–97)
MONOS ABS: 0.2 10*3/uL (ref 0.1–0.9)
Monocytes: 6 %
NEUTROS PCT: 64 %
Neutrophils Absolute: 2.7 10*3/uL (ref 1.4–7.0)
Platelets: 122 10*3/uL — ABNORMAL LOW (ref 150–379)
RBC: 5.03 x10E6/uL (ref 4.14–5.80)
RDW: 14.2 % (ref 12.3–15.4)
WBC: 4.1 10*3/uL (ref 3.4–10.8)

## 2016-10-26 LAB — LIPID PANEL
CHOLESTEROL TOTAL: 187 mg/dL (ref 100–199)
Chol/HDL Ratio: 5.2 ratio — ABNORMAL HIGH (ref 0.0–5.0)
HDL: 36 mg/dL — ABNORMAL LOW (ref >39)
LDL Calculated: 111 mg/dL — ABNORMAL HIGH (ref 0–99)
Triglycerides: 198 mg/dL — ABNORMAL HIGH (ref 0–149)
VLDL CHOLESTEROL CAL: 40 mg/dL (ref 5–40)

## 2016-10-26 LAB — BAYER DCA HB A1C WAIVED: HB A1C: 5.3 % (ref <7.0)

## 2016-10-26 MED ORDER — CYCLOBENZAPRINE HCL 10 MG PO TABS: 10.0000 mg | ORAL_TABLET | Freq: Three times a day (TID) | ORAL | 0 refills | Status: DC | PRN

## 2016-10-26 NOTE — Patient Instructions (Signed)
**Note De-identified Dubray Obfuscation** Great to see you!  Come back in 3 months unless you need us sooner.    

## 2016-10-26 NOTE — Progress Notes (Signed)
**Note De-identified Grandstaff Obfuscation**   **Note De-Identified Platas Obfuscation** HPI  Patient presents today here for follow-up of chronic medical conditions.  Patient has history of tubular adenoma, not high-grade, with last colonoscopy in 2012. Will refer.  Prediabetes Fasting today Watching diet moderately.  Hypertriglyceridemia, hyperlipidemia Tolerating statin well, some intermittent leg cramps that are difficult associated medication or not.  Patient does have some intermittent back cramps associated with exercise, uses Flexeril to her 3 times a month for this. He would like a refill  PMH: Smoking status noted ROS: Per HPI  Objective: BP 138/86   Pulse (!) 55   Temp (!) 96.9 F (36.1 C) (Oral)   Ht 5\' 10"  (1.778 m)   Wt 245 lb 6.4 oz (111.3 kg)   BMI 35.21 kg/m  Gen: NAD, alert, cooperative with exam HEENT: NCAT CV: RRR, good S1/S2, no murmur Resp: CTABL, no wheezes, non-labored Ext: No edema, warm Neuro: Alert and oriented, No gross deficits  Assessment and plan:  # Hypertension Well-controlled on current medications Continue lisinopril, no changes Labs  # BPH Well-controlled on Flomax PSA up-to-date, plan annual checks  # History of colon polyps Previous adenoma, no high-grade dysplasia Recommended return to GI for possible five-year follow-up C scope  # Prediabetes Checking A1c Continue therapeutic lifestyle changes  Her triglyceridemia Fasting today Recheck labs    No orders of the defined types were placed in this encounter.   No orders of the defined types were placed in this encounter.   Laroy Apple, MD [facility] 10/26/2016, 10:46 AM

## 2016-12-12 ENCOUNTER — Encounter: Payer: Self-pay | Admitting: Family Medicine

## 2016-12-12 ENCOUNTER — Ambulatory Visit (INDEPENDENT_AMBULATORY_CARE_PROVIDER_SITE_OTHER): Payer: BLUE CROSS/BLUE SHIELD | Admitting: Family Medicine

## 2016-12-12 VITALS — BP 130/87 | HR 73 | Temp 98.5°F | Ht 70.0 in | Wt 252.0 lb

## 2016-12-12 DIAGNOSIS — M109 Gout, unspecified: Secondary | ICD-10-CM | POA: Diagnosis not present

## 2016-12-12 MED ORDER — COLCHICINE 0.6 MG PO TABS
0.6000 mg | ORAL_TABLET | Freq: Every day | ORAL | 1 refills | Status: DC
Start: 2016-12-12 — End: 2020-12-09

## 2016-12-12 MED ORDER — METHYLPREDNISOLONE ACETATE 80 MG/ML IJ SUSP
80.0000 mg | Freq: Once | INTRAMUSCULAR | Status: AC
  Administered 2016-12-12: 80 mg via INTRAMUSCULAR

## 2016-12-12 NOTE — Progress Notes (Signed)
**Note De-Identified Lara Obfuscation** BP 130/87   Pulse 73   Temp 98.5 F (36.9 C) (Oral)   Ht _0  (1.778 m)   Wt 252 lb (114.3 kg)   BMI 36.16 kg/m    Subjective:    Patient ID: Daniel Brock, male    DOB: November 13, 1945, 71 y.o.   MRN: [id number]  HPI: Daniel Brock is a 71 y.o. male presenting on 12/12/2016 for Pain in left great toe (history of gout, taking Allopurinol)   HPI Left great toe pain Patient complains of left great toe pain that is similar to the previous time that he had gout over the past couple years. He is currently taking allopurinol for prevention which has helped reduce the amount of issues that he had but this started flaring up again yesterday. He says it hurts at the joint of his left great toe but he has not had the redness and swelling yet he is trying to get an early before he develops that. He denies any pain anywhere else. He has used both colchicine and indomethacin in the past but his kidney function has been elevated recently so they have been cautious with given him that recently. He says the pain hurts even with the bedsheet touching the toe.  Relevant past medical, surgical, family and social history reviewed and updated as indicated. Interim medical history since our last visit reviewed. Allergies and medications reviewed and updated.  Review of Systems  Constitutional: Negative for chills and fever.  Respiratory: Negative for shortness of breath and wheezing.   Cardiovascular: Negative for chest pain and leg swelling.  Musculoskeletal: Positive for arthralgias and joint swelling. Negative for back pain and gait problem.  Skin: Negative for rash.  All other systems reviewed and are negative.   Per HPI unless specifically indicated above     Objective:    BP 130/87   Pulse 73   Temp 98.5 F (36.9 C) (Oral)   Ht _1  (1.778 m)   Wt 252 lb (114.3 kg)   BMI 36.16 kg/m   Wt Readings from Last 3 Encounters:  12/12/16 252 lb (114.3 kg)  10/26/16 245 lb 6.4 oz (111.3 kg)  07/14/16  250 lb 9.6 oz (113.7 kg)    Physical Exam  Constitutional: He appears well-developed and well-nourished. No distress.  Eyes: Conjunctivae are normal. No scleral icterus.  Musculoskeletal: Normal range of motion. He exhibits no edema.       Right foot: There is tenderness and swelling. There is normal range of motion, normal capillary refill and no deformity.       Feet:  Neurological: He is alert.  Skin: Skin is warm and dry. No rash noted. He is not diaphoretic.  Psychiatric: He has a normal mood and affect. His behavior is normal.  Nursing note and vitals reviewed.      Assessment & Plan:   Problem List Items Addressed This Visit      Other   Gout - Primary   Relevant Medications   methylPREDNISolone acetate (DEPO-MEDROL) injection 80 mg (Start on 12/12/2016 12:30 PM)   colchicine 0.6 MG tablet   Other Relevant Orders   Uric acid   BMP8+EGFR       Follow up plan: Return if symptoms worsen or fail to improve.  Counseling provided for all of the vaccine components Orders Placed This Encounter  Procedures  . Uric acid  . BMP8+EGFR    Caryl Pina, MD [facility] 12/12/2016, 12:20 PM

## 2016-12-13 LAB — BMP8+EGFR
BUN/Creatinine Ratio: 15 (ref 10–24)
BUN: 16 mg/dL (ref 8–27)
CALCIUM: 8.8 mg/dL (ref 8.6–10.2)
CO2: 25 mmol/L (ref 20–29)
Chloride: 105 mmol/L (ref 96–106)
Creatinine, Ser: 1.07 mg/dL (ref 0.76–1.27)
GFR calc Af Amer: 80 mL/min/{1.73_m2} (ref >59)
GFR calc non Af Amer: 69 mL/min/{1.73_m2} (ref >59)
Glucose: 94 mg/dL (ref 65–99)
POTASSIUM: 4.2 mmol/L (ref 3.5–5.2)
Sodium: 147 mmol/L — ABNORMAL HIGH (ref 134–144)

## 2016-12-13 LAB — URIC ACID: URIC ACID: 6.1 mg/dL (ref 3.7–8.6)

## 2016-12-19 DIAGNOSIS — H527 Unspecified disorder of refraction: Secondary | ICD-10-CM | POA: Diagnosis not present

## 2016-12-21 ENCOUNTER — Ambulatory Visit (AMBULATORY_SURGERY_CENTER): Payer: Self-pay

## 2016-12-21 VITALS — Ht 70.5 in | Wt 246.4 lb

## 2016-12-21 DIAGNOSIS — Z860100 Personal history of colon polyps, unspecified: Secondary | ICD-10-CM

## 2016-12-21 NOTE — Progress Notes (Signed)
**Note De-identified Walko Obfuscation** No allergies to eggs or soy No diet meds No home oxygen No past problems with anesthesia  Declined emmi 

## 2016-12-22 ENCOUNTER — Encounter: Payer: Self-pay | Admitting: Internal Medicine

## 2017-01-04 ENCOUNTER — Ambulatory Visit (AMBULATORY_SURGERY_CENTER): Payer: BLUE CROSS/BLUE SHIELD | Admitting: Internal Medicine

## 2017-01-04 ENCOUNTER — Encounter: Payer: Self-pay | Admitting: Internal Medicine

## 2017-01-04 VITALS — BP 135/74 | HR 58 | Temp 95.7°F | Resp 14 | Ht 70.0 in | Wt 252.0 lb

## 2017-01-04 DIAGNOSIS — Z86010 Personal history of colonic polyps: Secondary | ICD-10-CM

## 2017-01-04 DIAGNOSIS — K621 Rectal polyp: Secondary | ICD-10-CM | POA: Diagnosis not present

## 2017-01-04 DIAGNOSIS — D123 Benign neoplasm of transverse colon: Secondary | ICD-10-CM

## 2017-01-04 DIAGNOSIS — Z1211 Encounter for screening for malignant neoplasm of colon: Secondary | ICD-10-CM | POA: Diagnosis not present

## 2017-01-04 DIAGNOSIS — D128 Benign neoplasm of rectum: Secondary | ICD-10-CM

## 2017-01-04 MED ORDER — SODIUM CHLORIDE 0.9 % IV SOLN: 500.0000 mL | INTRAVENOUS | Status: DC

## 2017-01-04 NOTE — Progress Notes (Signed)
**Note De-identified Ramus Obfuscation** Called to room to assist during endoscopic procedure.  Patient ID and intended procedure confirmed with present staff. Received instructions for my participation in the procedure from the performing physician.  

## 2017-01-04 NOTE — Progress Notes (Signed)
**Note De-Identified Orvis Obfuscation** A and O x3. Report to RN. Tolerated MAC anesthesia well.

## 2017-01-04 NOTE — Progress Notes (Signed)
**Note De-identified Dimmer Obfuscation** No changes in medical or surgical hx since PV per pt 

## 2017-01-04 NOTE — Patient Instructions (Addendum)
**Note De-identified Naron Obfuscation**   **Note De-Identified Breon Obfuscation** I removed 3 tiny polyps - all look benign. I doubt you will need to repeat a routine colonoscopy. Will let you know results and recommendations.  I appreciate the opportunity to care for you. Gatha Mayer, MD, FACG  YOU HAD AN ENDOSCOPIC PROCEDURE TODAY AT [facility]:   Refer to the procedure report that was given to you for any specific questions about what was found during the examination.  If the procedure report does not answer your questions, please call your gastroenterologist to clarify.  If you requested that your care partner not be given the details of your procedure findings, then the procedure report has been included in a sealed envelope for you to review at your convenience later.  YOU SHOULD EXPECT: Some feelings of bloating in the abdomen. Passage of more gas than usual.  Walking can help get rid of the air that was put into your GI tract during the procedure and reduce the bloating. If you had a lower endoscopy (such as a colonoscopy or flexible sigmoidoscopy) you may notice spotting of blood in your stool or on the toilet paper. If you underwent a bowel prep for your procedure, you may not have a normal bowel movement for a few days.  Please Note:  You might notice some irritation and congestion in your nose or some drainage.  This is from the oxygen used during your procedure.  There is no need for concern and it should clear up in a day or so.  SYMPTOMS TO REPORT IMMEDIATELY:   Following lower endoscopy (colonoscopy or flexible sigmoidoscopy):  Excessive amounts of blood in the stool  Significant tenderness or worsening of abdominal pains  Swelling of the abdomen that is new, acute  Fever of 100F or higher   For urgent or emergent issues, a gastroenterologist can be reached at any hour by calling [phone].   DIET:  We do recommend a small meal at first, but then you may proceed to your regular diet.  Drink plenty of fluids but you  should avoid alcoholic beverages for 24 hours.  ACTIVITY:  You should plan to take it easy for the rest of today and you should NOT DRIVE or use heavy machinery until tomorrow (because of the sedation medicines used during the test).    FOLLOW UP: Our staff will call the number listed on your records the next business day following your procedure to check on you and address any questions or concerns that you may have regarding the information given to you following your procedure. If we do not reach you, we will leave a message.  However, if you are feeling well and you are not experiencing any problems, there is no need to return our call.  We will assume that you have returned to your regular daily activities without incident.  If any biopsies were taken you will be contacted by phone or by letter within the next 1-3 weeks.  Please call us at [phone] if you have not heard about the biopsies in 3 weeks.    SIGNATURES/CONFIDENTIALITY: You and/or your care partner have signed paperwork which will be entered into your electronic medical record.  These signatures attest to the fact that that the information above on your After Visit Summary has been reviewed and is understood.  Full responsibility of the confidentiality of this discharge information lies with you and/or your care-partner.

## 2017-01-04 NOTE — Op Note (Signed)
**Note De-Identified Covalt Obfuscation** [place] Patient Name: Daniel Brock Procedure Date: 01/04/2017 9:27 AM MRN: [id number] Endoscopist: Gatha Mayer , MD Age: 71 Referring MD:  Date of Birth: 03-02-1946 Gender: Male Account #: [id number] Procedure:                Colonoscopy Indications:              Surveillance: Personal history of adenomatous                            polyps on last colonoscopy > 5 years ago, Last                            colonoscopy: 2012 Medicines:                Propofol per Anesthesia, Monitored Anesthesia Care Procedure:                Pre-Anesthesia Assessment:                           - Prior to the procedure, a History and Physical                            was performed, and patient medications and                            allergies were reviewed. The patient's tolerance of                            previous anesthesia was also reviewed. The risks                            and benefits of the procedure and the sedation                            options and risks were discussed with the patient.                            All questions were answered, and informed consent                            was obtained. Prior Anticoagulants: The patient has                            taken no previous anticoagulant or antiplatelet                            agents. ASA Grade Assessment: II - A patient with                            mild systemic disease. After reviewing the risks                            and benefits, the patient was deemed in **Note De-Identified Cederberg Obfuscation** satisfactory condition to undergo the procedure.                           After obtaining informed consent, the colonoscope                            was passed under direct vision. Throughout the                            procedure, the patient's blood pressure, pulse, and                            oxygen saturations were monitored continuously. The                            Colonoscope was introduced  through the anus and                            advanced to the the cecum, identified by                            appendiceal orifice and ileocecal valve. The                            colonoscopy was performed without difficulty. The                            patient tolerated the procedure well. The quality                            of the bowel preparation was excellent. The bowel                            preparation used was Miralax. The ileocecal valve,                            appendiceal orifice, and rectum were photographed. Scope In: 9:42:11 AM Scope Out: 9:56:23 AM Scope Withdrawal Time: 0 hours 11 minutes 50 seconds  Total Procedure Duration: 0 hours 14 minutes 12 seconds  Findings:                 The perianal and digital rectal examinations were                            normal. Pertinent negatives include normal prostate                            (size, shape, and consistency).                           Three sessile polyps were found in the rectum and                            transverse colon. The polyps were diminutive in **Note De-Identified Piscopo Obfuscation** size. These polyps were removed with a cold snare.                            Resection and retrieval were complete. Verification                            of patient identification for the specimen was                            done. Estimated blood loss was minimal.                           The exam was otherwise without abnormality on                            direct and retroflexion views. Complications:            No immediate complications. Estimated Blood Loss:     Estimated blood loss was minimal. Impression:               - Three diminutive polyps in the rectum and in the                            transverse colon, removed with a cold snare.                            Resected and retrieved.                           - The examination was otherwise normal on direct                            and  retroflexion views.                           - Personal history of colonic polyps. Recommendation:           - Patient has a contact number available for                            emergencies. The signs and symptoms of potential                            delayed complications were discussed with the                            patient. Return to normal activities tomorrow.                            Written discharge instructions were provided to the                            patient.                           - Resume previous diet.                           - **Note De-Identified Winchel Obfuscation** Continue present medications.                           - No repeat colonoscopy due to age. Gatha Mayer, MD 01/04/2017 10:03:15 AM This report has been signed electronically.

## 2017-01-05 ENCOUNTER — Telehealth: Payer: Self-pay

## 2017-01-05 NOTE — Telephone Encounter (Signed)
**Note De-identified Ungar Obfuscation**  **Note De-Identified Neer Obfuscation** Follow up Call-  Call Daniel Brock number 01/04/2017  Post procedure Call Daniel Brock phone  # [phone] (H)  Permission to leave phone message Yes  Some recent data might be hidden     Patient questions:  Do you have a fever, pain , or abdominal swelling? No. Pain Score  0 *  Have you tolerated food without any problems? Yes.    Have you been able to return to your normal activities? Yes.    Do you have any questions about your discharge instructions: Diet   No. Medications  No. Follow up visit  No.  Do you have questions or concerns about your Care? No.  Actions: * If pain score is 4 or above: No action needed, pain <4.

## 2017-01-15 ENCOUNTER — Encounter: Payer: Self-pay | Admitting: Internal Medicine

## 2017-01-15 NOTE — Progress Notes (Signed)
**Note De-Identified Difabio Obfuscation** 1 adenoma 2 hyperplastic recall 2023 - consider colonoscopy then (age 71) My Chart letter

## 2017-01-26 ENCOUNTER — Encounter: Payer: Self-pay | Admitting: Family Medicine

## 2017-01-26 ENCOUNTER — Ambulatory Visit: Payer: BLUE CROSS/BLUE SHIELD | Admitting: Family Medicine

## 2017-01-26 VITALS — BP 135/80 | HR 76 | Temp 97.4°F | Ht 70.0 in | Wt 249.4 lb

## 2017-01-26 DIAGNOSIS — M5442 Lumbago with sciatica, left side: Secondary | ICD-10-CM | POA: Diagnosis not present

## 2017-01-26 DIAGNOSIS — B351 Tinea unguium: Secondary | ICD-10-CM | POA: Diagnosis not present

## 2017-01-26 DIAGNOSIS — I10 Essential (primary) hypertension: Secondary | ICD-10-CM

## 2017-01-26 MED ORDER — TERBINAFINE HCL 250 MG PO TABS: 250.0000 mg | ORAL_TABLET | Freq: Every day | ORAL | 2 refills | Status: DC

## 2017-01-26 MED ORDER — METHYLPREDNISOLONE ACETATE 80 MG/ML IJ SUSP
80.0000 mg | Freq: Once | INTRAMUSCULAR | Status: AC
  Administered 2017-01-26: 80 mg via INTRAMUSCULAR

## 2017-01-26 NOTE — Addendum Note (Signed)
**Note De-Identified Crisco Obfuscation** Addended by: Karle Plumber on: 01/26/2017 09:54 AM   Modules accepted: Orders

## 2017-01-26 NOTE — Addendum Note (Signed)
**Note De-Identified Juste Obfuscation** Addended by: Nigel Berthold C on: 01/26/2017 10:19 AM   Modules accepted: Orders

## 2017-01-26 NOTE — Progress Notes (Signed)
**Note De-identified Laidlaw Obfuscation**   **Note De-Identified Pilkington Obfuscation** HPI  Patient presents today for follow-up chronic medical conditions as well as back pain.  Hypertension Good medication compliance, no chest pain or headaches.  Left-sided low back pain for about 2 weeks, also bilateral low back pain but intermittent left-sided sciatica. No leg weakness or numbness. No problems walking.  Toenail fungus Previously took a course of terbinafine about 10-12 years ago, did not seem to improve at that time. Would like to give it another go.  PMH: Smoking status noted ROS: Per HPI  Objective: BP 135/80   Pulse 76   Temp (!) 97.4 F (36.3 C) (Oral)   Ht 5\' 10"  (1.778 m)   Wt 249 lb 6.4 oz (113.1 kg)   BMI 35.79 kg/m  Gen: NAD, alert, cooperative with exam HEENT: NCAT, EOMI, PERRL CV: RRR, good S1/S2, no murmur Resp: CTABL, no wheezes, non-labored Ext: No edema, warm Neuro: Alert and oriented, No gross deficits Right foot with thickened yellow great toenail and second and third toenail  Assessment and plan:  #onychomcycosis Terbinafine times 12 weeks Discussed usual course of illness  #Hypertension Well-controlled with lisinopril Labs up-to-date Refilled  #Acute left-sided low back pain With radiation to the right low back and intermittent left-sided sciatica IM Depo-Medrol    Meds ordered this encounter  Medications  . cyclobenzaprine (FLEXERIL) 10 MG tablet    Sig: Take 10 mg 3 (three) times daily as needed by mouth for muscle spasms.    Laroy Apple, MD [facility] 01/26/2017, 9:48 AM

## 2017-03-08 DIAGNOSIS — H35411 Lattice degeneration of retina, right eye: Secondary | ICD-10-CM | POA: Diagnosis not present

## 2017-03-08 DIAGNOSIS — H353123 Nonexudative age-related macular degeneration, left eye, advanced atrophic without subfoveal involvement: Secondary | ICD-10-CM | POA: Diagnosis not present

## 2017-03-08 DIAGNOSIS — H4423 Degenerative myopia, bilateral: Secondary | ICD-10-CM | POA: Diagnosis not present

## 2017-03-08 DIAGNOSIS — H353114 Nonexudative age-related macular degeneration, right eye, advanced atrophic with subfoveal involvement: Secondary | ICD-10-CM | POA: Diagnosis not present

## 2017-03-22 ENCOUNTER — Ambulatory Visit (INDEPENDENT_AMBULATORY_CARE_PROVIDER_SITE_OTHER): Payer: BLUE CROSS/BLUE SHIELD

## 2017-03-22 DIAGNOSIS — Z23 Encounter for immunization: Secondary | ICD-10-CM | POA: Diagnosis not present

## 2017-03-23 ENCOUNTER — Other Ambulatory Visit: Payer: Self-pay | Admitting: Family Medicine

## 2017-04-05 ENCOUNTER — Other Ambulatory Visit: Payer: Self-pay | Admitting: Family Medicine

## 2017-04-05 DIAGNOSIS — E785 Hyperlipidemia, unspecified: Secondary | ICD-10-CM

## 2017-04-05 NOTE — Telephone Encounter (Signed)
**Note De-Identified Oxendine Obfuscation** Last lipid 10/26/16  Dr B

## 2017-07-27 ENCOUNTER — Ambulatory Visit: Payer: BLUE CROSS/BLUE SHIELD | Admitting: Family Medicine

## 2017-07-27 ENCOUNTER — Encounter: Payer: Self-pay | Admitting: Family Medicine

## 2017-07-27 VITALS — BP 111/71 | HR 79 | Temp 97.1°F | Ht 70.0 in | Wt 251.6 lb

## 2017-07-27 DIAGNOSIS — N400 Enlarged prostate without lower urinary tract symptoms: Secondary | ICD-10-CM | POA: Diagnosis not present

## 2017-07-27 DIAGNOSIS — M5442 Lumbago with sciatica, left side: Secondary | ICD-10-CM

## 2017-07-27 DIAGNOSIS — Z0000 Encounter for general adult medical examination without abnormal findings: Secondary | ICD-10-CM

## 2017-07-27 MED ORDER — SERTRALINE HCL 100 MG PO TABS: 50.0000 mg | ORAL_TABLET | Freq: Every day | ORAL | 1 refills | Status: DC

## 2017-07-27 MED ORDER — KETOROLAC TROMETHAMINE 60 MG/2ML IM SOLN
60.0000 mg | Freq: Once | INTRAMUSCULAR | Status: AC
  Administered 2017-07-27: 60 mg via INTRAMUSCULAR

## 2017-07-27 MED ORDER — METHYLPREDNISOLONE ACETATE 80 MG/ML IJ SUSP
80.0000 mg | Freq: Once | INTRAMUSCULAR | Status: AC
  Administered 2017-07-27: 80 mg via INTRAMUSCULAR

## 2017-07-27 MED ORDER — TAMSULOSIN HCL 0.4 MG PO CAPS
0.4000 mg | ORAL_CAPSULE | Freq: Every day | ORAL | 3 refills | Status: DC
Start: 2017-07-27 — End: 2017-10-02

## 2017-07-27 NOTE — Patient Instructions (Signed)
**Note De-Identified Man[place] Obfuscation** Great to see you!  Come see Dr. Warrick Parisian in 6 months

## 2017-07-27 NOTE — Progress Notes (Signed)
**Note De-identified Vester Obfuscation**   **Note De-Identified Neuwirth Obfuscation** HPI  Patient presents today for annual physical exam.  Patient feels well and has no complaints except for back pain.  Patient is active around the house, he watches his diet minimally.  He states that he has had slight burning with urination for 3 or 4 weeks, no fever, chills, sweats.  He is also had slight blurred vision over the last month.  He has not seen an eye doctor yet.  Low back pain Merrily left-sided low back pain that goes down the left leg.  He states that it is an aching type pain with no obvious onset or clear etiology.  PMH: Smoking status noted ROS: Per HPI  Objective: BP 111/71   Pulse 79   Temp (!) 97.1 F (36.2 C) (Oral)   Ht '5\' 10"'$  (1.778 m)   Wt 251 lb 9.6 oz (114.1 kg)   BMI 36.10 kg/m  Gen: NAD, alert, cooperative with exam HEENT: NCAT CV: RRR, good S1/S2, no murmur Resp: CTABL, no wheezes, non-labored Abd: SNTND, BS present, no guarding or organomegaly Ext: No edema, warm  Neuro: Alert and oriented, No gross deficits MSK:  No tenderness to palpation of midline spine or left low back, negative straight leg raise modified,  Assessment and plan:  #Annual physical exam Normal physical except for weight, labs today   #Left-sided low back pain with sciatica Given IM Toradol plus IM Depo-Medrol in clinic today Discussed safe NSAID use  #BPH Refill Flomax, checking PSA   Orders Placed This Encounter  Procedures  . Lipid panel  . CMP14+EGFR  . CBC with Differential/Platelet  . PSA    Meds ordered this encounter  Medications  . sertraline (ZOLOFT) 100 MG tablet    Sig: Take 0.5 tablets (50 mg total) by mouth daily.    Dispense:  90 tablet    Refill:  1  . tamsulosin (FLOMAX) 0.4 MG CAPS capsule    Sig: Take 1 capsule (0.4 mg total) by mouth daily.    Dispense:  90 capsule    Refill:  3  . methylPREDNISolone acetate (DEPO-MEDROL) injection 80 mg  . ketorolac (TORADOL) injection 60 mg    Laroy Apple, MD [facility]  [facility] 07/27/2017, 10:52 AM

## 2017-07-28 ENCOUNTER — Other Ambulatory Visit: Payer: Self-pay | Admitting: Family Medicine

## 2017-07-28 LAB — CBC WITH DIFFERENTIAL/PLATELET
BASOS ABS: 0 10*3/uL (ref 0.0–0.2)
Basos: 0 %
EOS (ABSOLUTE): 0.1 10*3/uL (ref 0.0–0.4)
Eos: 2 %
Hematocrit: 44 % (ref 37.5–51.0)
Hemoglobin: 14.5 g/dL (ref 13.0–17.7)
Immature Grans (Abs): 0 10*3/uL (ref 0.0–0.1)
Immature Granulocytes: 0 %
LYMPHS ABS: 1.6 10*3/uL (ref 0.7–3.1)
Lymphs: 32 %
MCH: 28.4 pg (ref 26.6–33.0)
MCHC: 33 g/dL (ref 31.5–35.7)
MCV: 86 fL (ref 79–97)
MONOS ABS: 0.3 10*3/uL (ref 0.1–0.9)
Monocytes: 7 %
NEUTROS ABS: 2.9 10*3/uL (ref 1.4–7.0)
Neutrophils: 59 %
PLATELETS: 142 10*3/uL — AB (ref 150–379)
RBC: 5.11 x10E6/uL (ref 4.14–5.80)
RDW: 15 % (ref 12.3–15.4)
WBC: 4.9 10*3/uL (ref 3.4–10.8)

## 2017-07-28 LAB — LIPID PANEL
Chol/HDL Ratio: 7.7 ratio — ABNORMAL HIGH (ref 0.0–5.0)
Cholesterol, Total: 263 mg/dL — ABNORMAL HIGH (ref 100–199)
HDL: 34 mg/dL — AB (ref >39)
Triglycerides: 480 mg/dL — ABNORMAL HIGH (ref 0–149)

## 2017-07-28 LAB — CMP14+EGFR
A/G RATIO: 2.4 — AB (ref 1.2–2.2)
ALBUMIN: 4.4 g/dL (ref 3.5–4.8)
ALK PHOS: 111 IU/L (ref 39–117)
ALT: 19 IU/L (ref 0–44)
AST: 15 IU/L (ref 0–40)
BILIRUBIN TOTAL: 0.4 mg/dL (ref 0.0–1.2)
BUN / CREAT RATIO: 21 (ref 10–24)
BUN: 31 mg/dL — ABNORMAL HIGH (ref 8–27)
CHLORIDE: 106 mmol/L (ref 96–106)
CO2: 21 mmol/L (ref 20–29)
Calcium: 9.2 mg/dL (ref 8.6–10.2)
Creatinine, Ser: 1.47 mg/dL — ABNORMAL HIGH (ref 0.76–1.27)
GFR calc non Af Amer: 47 mL/min/{1.73_m2} — ABNORMAL LOW (ref >59)
GFR, EST AFRICAN AMERICAN: 54 mL/min/{1.73_m2} — AB (ref >59)
GLUCOSE: 100 mg/dL — AB (ref 65–99)
Globulin, Total: 1.8 g/dL (ref 1.5–4.5)
POTASSIUM: 4.6 mmol/L (ref 3.5–5.2)
Sodium: 143 mmol/L (ref 134–144)
TOTAL PROTEIN: 6.2 g/dL (ref 6.0–8.5)

## 2017-07-28 LAB — PSA: PROSTATE SPECIFIC AG, SERUM: 2.7 ng/mL (ref 0.0–4.0)

## 2017-07-28 MED ORDER — EZETIMIBE 10 MG PO TABS: 10.0000 mg | ORAL_TABLET | Freq: Every day | ORAL | 3 refills | Status: DC

## 2017-07-28 NOTE — Progress Notes (Signed)
**Note De-identified Orvis Obfuscation** zetia 

## 2017-08-02 ENCOUNTER — Other Ambulatory Visit: Payer: Self-pay | Admitting: *Deleted

## 2017-08-02 ENCOUNTER — Encounter: Payer: Self-pay | Admitting: Pediatrics

## 2017-08-02 ENCOUNTER — Ambulatory Visit (INDEPENDENT_AMBULATORY_CARE_PROVIDER_SITE_OTHER): Payer: BLUE CROSS/BLUE SHIELD

## 2017-08-02 ENCOUNTER — Other Ambulatory Visit: Payer: Self-pay | Admitting: Pediatrics

## 2017-08-02 ENCOUNTER — Ambulatory Visit: Payer: BLUE CROSS/BLUE SHIELD | Admitting: Pediatrics

## 2017-08-02 VITALS — BP 136/78 | HR 66 | Temp 98.1°F | Ht 70.0 in | Wt 252.0 lb

## 2017-08-02 DIAGNOSIS — M546 Pain in thoracic spine: Secondary | ICD-10-CM

## 2017-08-02 DIAGNOSIS — M25561 Pain in right knee: Secondary | ICD-10-CM

## 2017-08-02 DIAGNOSIS — M5442 Lumbago with sciatica, left side: Secondary | ICD-10-CM

## 2017-08-02 DIAGNOSIS — G8929 Other chronic pain: Secondary | ICD-10-CM | POA: Diagnosis not present

## 2017-08-02 DIAGNOSIS — M47816 Spondylosis without myelopathy or radiculopathy, lumbar region: Secondary | ICD-10-CM | POA: Diagnosis not present

## 2017-08-02 MED ORDER — DICLOFENAC SODIUM 2 % TD SOLN: 1.0000 "application " | Freq: Every day | TRANSDERMAL | 0 refills | Status: DC | PRN

## 2017-08-02 MED ORDER — PREDNISONE 10 MG (21) PO TBPK: ORAL_TABLET | Freq: Every day | ORAL | 0 refills | Status: DC
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
## 2017-08-02 NOTE — Patient Instructions (Signed)
**Note De-identified Cerezo Obfuscation** Back Exercises If you have pain in your back, do these exercises 2-3 times each day or as told by your doctor. When the pain goes away, do the exercises once each day, but repeat the steps more times for each exercise (do more repetitions). If you do not have pain in your back, do these exercises once each day or as told by your doctor. Exercises Single Knee to Chest  Do these steps 3-5 times in a row for each leg: 1. Lie on your back on a firm bed or the floor with your legs stretched out. 2. Bring one knee to your chest. 3. Hold your knee to your chest by grabbing your knee or thigh. 4. Pull on your knee until you feel a gentle stretch in your lower back. 5. Keep doing the stretch for 10-30 seconds. 6. Slowly let go of your leg and straighten it.  Pelvic Tilt  Do these steps 5-10 times in a row: 1. Lie on your back on a firm bed or the floor with your legs stretched out. 2. Bend your knees so they point up to the ceiling. Your feet should be flat on the floor. 3. Tighten your lower belly (abdomen) muscles to press your lower back against the floor. This will make your tailbone point up to the ceiling instead of pointing down to your feet or the floor. 4. Stay in this position for 5-10 seconds while you gently tighten your muscles and breathe evenly.  Cat-Cow  Do these steps until your lower back bends more easily: 1. Get on your hands and knees on a firm surface. Keep your hands under your shoulders, and keep your knees under your hips. You may put padding under your knees. 2. Let your head hang down, and make your tailbone point down to the floor so your lower back is round like the back of a cat. 3. Stay in this position for 5 seconds. 4. Slowly lift your head and make your tailbone point up to the ceiling so your back hangs low (sags) like the back of a cow. 5. Stay in this position for 5 seconds.  Press-Ups  Do these steps 5-10 times in a row: 1. Lie on your belly (face-down)  on the floor. 2. Place your hands near your head, about shoulder-width apart. 3. While you keep your back relaxed and keep your hips on the floor, slowly straighten your arms to raise the top half of your body and lift your shoulders. Do not use your back muscles. To make yourself more comfortable, you may change where you place your hands. 4. Stay in this position for 5 seconds. 5. Slowly return to lying flat on the floor.  Bridges  Do these steps 10 times in a row: 1. Lie on your back on a firm surface. 2. Bend your knees so they point up to the ceiling. Your feet should be flat on the floor. 3. Tighten your butt muscles and lift your butt off of the floor until your waist is almost as high as your knees. If you do not feel the muscles working in your butt and the back of your thighs, slide your feet 1-2 inches farther away from your butt. 4. Stay in this position for 3-5 seconds. 5. Slowly lower your butt to the floor, and let your butt muscles relax.  If this exercise is too easy, try doing it with your arms crossed over your chest. Back Lifts Do these steps 5-10 times in a  **Note De-identified Ibrahim Obfuscation** row: 1. Lie on your belly (face-down) with your arms at your sides, and rest your forehead on the floor. 2. Tighten the muscles in your legs and your butt. 3. Slowly lift your chest off of the floor while you keep your hips on the floor. Keep the back of your head in line with the curve in your back. Look at the floor while you do this. 4. Stay in this position for 3-5 seconds. 5. Slowly lower your chest and your face to the floor.  Contact a doctor if:  Your back pain gets a lot worse when you do an exercise.  Your back pain does not lessen 2 hours after you exercise. If you have any of these problems, stop doing the exercises. Do not do them again unless your doctor says it is okay. Get help right away if:  You have sudden, very bad back pain. If this happens, stop doing the exercises. Do not do them again  unless your doctor says it is okay. This information is not intended to replace advice given to you by your health care provider. Make sure you discuss any questions you have with your health care provider. Document Released: 04/09/2010 Document Revised: 08/13/2015 Document Reviewed: 05/01/2014 Elsevier Interactive Patient Education  2018 [employer]   

## 2017-08-07 ENCOUNTER — Telehealth: Payer: Self-pay | Admitting: Family Medicine

## 2017-08-07 DIAGNOSIS — M5442 Lumbago with sciatica, left side: Principal | ICD-10-CM

## 2017-08-07 DIAGNOSIS — G8929 Other chronic pain: Secondary | ICD-10-CM

## 2017-08-07 NOTE — Telephone Encounter (Signed)
**Note De-Identified Hands Obfuscation** Pt would like a referral to see Dr Nelva Bush at [facility] for his ongoing back pain. Referral placed.

## 2017-08-29 DIAGNOSIS — M5136 Other intervertebral disc degeneration, lumbar region: Secondary | ICD-10-CM | POA: Diagnosis not present

## 2017-08-29 DIAGNOSIS — M5030 Other cervical disc degeneration, unspecified cervical region: Secondary | ICD-10-CM | POA: Diagnosis not present

## 2017-08-29 DIAGNOSIS — M545 Low back pain: Secondary | ICD-10-CM | POA: Diagnosis not present

## 2017-09-22 ENCOUNTER — Other Ambulatory Visit: Payer: Self-pay | Admitting: Family Medicine

## 2017-09-22 MED ORDER — LISINOPRIL 20 MG PO TABS: 20.0000 mg | ORAL_TABLET | Freq: Every day | ORAL | 3 refills | Status: DC

## 2017-09-22 NOTE — Telephone Encounter (Signed)
**Note De-identified Julson Obfuscation** Rx sent- patient aware.  

## 2017-10-02 ENCOUNTER — Ambulatory Visit: Payer: BLUE CROSS/BLUE SHIELD | Admitting: Family Medicine

## 2017-10-02 VITALS — BP 132/72 | HR 74 | Temp 97.0°F | Ht 70.0 in | Wt 256.4 lb

## 2017-10-02 DIAGNOSIS — G8929 Other chronic pain: Secondary | ICD-10-CM | POA: Diagnosis not present

## 2017-10-02 DIAGNOSIS — N400 Enlarged prostate without lower urinary tract symptoms: Secondary | ICD-10-CM | POA: Diagnosis not present

## 2017-10-02 DIAGNOSIS — M25561 Pain in right knee: Secondary | ICD-10-CM | POA: Diagnosis not present

## 2017-10-02 DIAGNOSIS — E785 Hyperlipidemia, unspecified: Secondary | ICD-10-CM | POA: Diagnosis not present

## 2017-10-02 DIAGNOSIS — M545 Low back pain: Secondary | ICD-10-CM

## 2017-10-02 DIAGNOSIS — R0600 Dyspnea, unspecified: Secondary | ICD-10-CM

## 2017-10-02 DIAGNOSIS — R0609 Other forms of dyspnea: Secondary | ICD-10-CM

## 2017-10-02 DIAGNOSIS — I10 Essential (primary) hypertension: Secondary | ICD-10-CM | POA: Diagnosis not present

## 2017-10-02 LAB — BAYER DCA HB A1C WAIVED: HB A1C: 5.6 % (ref <7.0)

## 2017-10-02 LAB — CMP14+EGFR
ALBUMIN: 4 g/dL (ref 3.5–4.8)
ALK PHOS: 100 IU/L (ref 39–117)
ALT: 16 IU/L (ref 0–44)
AST: 13 IU/L (ref 0–40)
Albumin/Globulin Ratio: 2.9 — ABNORMAL HIGH (ref 1.2–2.2)
BILIRUBIN TOTAL: 0.5 mg/dL (ref 0.0–1.2)
BUN / CREAT RATIO: 16 (ref 10–24)
BUN: 20 mg/dL (ref 8–27)
CO2: 23 mmol/L (ref 20–29)
Calcium: 8.5 mg/dL — ABNORMAL LOW (ref 8.6–10.2)
Chloride: 105 mmol/L (ref 96–106)
Creatinine, Ser: 1.29 mg/dL — ABNORMAL HIGH (ref 0.76–1.27)
GFR calc Af Amer: 63 mL/min/{1.73_m2} (ref >59)
GFR calc non Af Amer: 55 mL/min/{1.73_m2} — ABNORMAL LOW (ref >59)
GLUCOSE: 110 mg/dL — AB (ref 65–99)
Globulin, Total: 1.4 g/dL — ABNORMAL LOW (ref 1.5–4.5)
Potassium: 4.1 mmol/L (ref 3.5–5.2)
Sodium: 142 mmol/L (ref 134–144)
Total Protein: 5.4 g/dL — ABNORMAL LOW (ref 6.0–8.5)

## 2017-10-02 LAB — LIPID PANEL
Chol/HDL Ratio: 5.4 ratio — ABNORMAL HIGH (ref 0.0–5.0)
Cholesterol, Total: 205 mg/dL — ABNORMAL HIGH (ref 100–199)
HDL: 38 mg/dL — AB (ref >39)
LDL Calculated: 108 mg/dL — ABNORMAL HIGH (ref 0–99)
Triglycerides: 294 mg/dL — ABNORMAL HIGH (ref 0–149)
VLDL CHOLESTEROL CAL: 59 mg/dL — AB (ref 5–40)

## 2017-10-02 MED ORDER — CARVEDILOL 6.25 MG PO TABS: 6.2500 mg | ORAL_TABLET | Freq: Two times a day (BID) | ORAL | 3 refills | Status: DC

## 2017-10-02 MED ORDER — TAMSULOSIN HCL 0.4 MG PO CAPS: 0.8000 mg | ORAL_CAPSULE | Freq: Every day | ORAL | 3 refills | Status: DC

## 2017-10-02 MED ORDER — DICLOFENAC SODIUM 2 % TD SOLN: 1.0000 "application " | Freq: Every day | TRANSDERMAL | 3 refills | Status: DC | PRN

## 2017-10-02 NOTE — Progress Notes (Signed)
**Note De-identified Daniel Brock**   **Note De-identified Daniel Brock** HPI  Patient presents today for follow-up chronic medical conditions.  Hyperlipidemia Patient has not tolerated statins well, the last one he tried was Livalo.  It was also too expensive He is now on Zetia.  He states that it still causing myalgias. He is willing to continue it however.  His wife reported that a recent visit and he has had shortness of breath with activity and just not acted like himself.  Patient states that he does have some shortness of breath with activity that has developed and worsened over the last few months. He is willing to see cardiology.  Patient is also having uncontrolled symptoms of BPH.  He is taking 1 Flomax daily. Reports multiple episodes of nocturia at night. He does have daytime sleepiness and loud snoring.  He would like to stop lisinopril due to side effects, he reports slight unsteadiness type dizziness whenever he first stands up.  His sister has been a nurse for 40 years and states that he needs to get off of lisinopril due to this, she takes atenolol.  Interested in atenolol  She also requests referral to physical therapy, he was given a referral by orthopedic surgery and lost the prescription.   PMH: Smoking status noted ROS: Per HPI  Objective: BP 132/72   Pulse 74   Temp (!) 97 F (36.1 C) (Oral)   Ht 5' 10" (1.778 m)   Wt 256 lb 6.4 oz (116.3 kg)   BMI 36.79 kg/m  Gen: NAD, alert, cooperative with exam HEENT: NCAT CV: RRR, good S1/S2, no murmur Resp: CTABL, no wheezes, non-labored Ext: No edema, warm Neuro: Alert and oriented  Assessment and plan:  #Hypertension Controlled with lisinopril 20 mg, however he would like to stop Discontinue lisinopril Start Coreg 6.25 twice daily Follow-up 3 months with new PCP  #Hyperlipidemia Not tolerating statins Zetia currently Labs today  #Exertional dyspnea His wife had a discussion with me a few weeks ago she is very concerned, patient has nonspecific symptoms but does have  worsening exertional dyspnea over the last several months. Referral to cardiology, consider cardiac etiology-appreciate the recommendations  #Low back pain Patient with low back pain that severe, seeing orthopedics now, refer to PT or his request  BPH- Uncontrolled- titrate flomax to 0.8 Consider proscar   Orders Placed This Encounter  Procedures  . Lipid panel  . CMP14+EGFR  . Bayer DCA Hb A1c Waived  . Ambulatory referral to Physical Therapy    Referral Priority:   Routine    Referral Type:   Physical Medicine    Referral Reason:   Specialty Services Required    Requested Specialty:   Physical Therapy    Number of Visits Requested:   1  . Ambulatory referral to Cardiology    Referral Priority:   Routine    Referral Type:   Consultation    Referral Reason:   Specialty Services Required    Referred to Provider:   Koneswaran, Suresh A, MD    Requested Specialty:   Cardiology    Number of Visits Requested:   1    Meds ordered this encounter  Medications  . carvedilol (COREG) 6.25 MG tablet    Sig: Take 1 tablet (6.25 mg total) by mouth 2 (two) times daily with a meal.    Dispense:  60 tablet    Refill:  3    Sam Bradshaw, MD [facility] 10/02/2017, 9:36 AM     

## 2017-10-02 NOTE — Patient Instructions (Signed)
**Note De-Identified Shearon Obfuscation** Great to see you!  Stop lisinopril, start Coreg (carvedilol) 1 pill twice daily  You will be called to set up an appointment with cardiology

## 2017-10-11 ENCOUNTER — Encounter: Payer: Self-pay | Admitting: Physical Therapy

## 2017-10-11 ENCOUNTER — Other Ambulatory Visit: Payer: Self-pay

## 2017-10-11 ENCOUNTER — Ambulatory Visit: Payer: BLUE CROSS/BLUE SHIELD | Attending: Family Medicine | Admitting: Physical Therapy

## 2017-10-11 DIAGNOSIS — G8929 Other chronic pain: Secondary | ICD-10-CM | POA: Diagnosis not present

## 2017-10-11 DIAGNOSIS — R293 Abnormal posture: Secondary | ICD-10-CM | POA: Diagnosis not present

## 2017-10-11 DIAGNOSIS — M545 Low back pain: Secondary | ICD-10-CM | POA: Diagnosis not present

## 2017-10-11 NOTE — Therapy (Signed)
**Note De-Identified Ehly Obfuscation** [facility] [place], [place], [address] Phone: [phone]   Fax:  [phone]  Physical Therapy Evaluation  Patient Details  Name: Daniel Brock MRN: [id number] Date of Birth: 1945-10-30 Referring Provider: Kenn File MD.   Encounter Date: 10/11/2017  PT End of Session - 10/11/17 1339    Visit Number  1    Number of Visits  12    Date for PT Re-Evaluation  11/22/17    PT Start Time  0100    PT Stop Time  0153    PT Time Calculation (min)  53 min    Activity Tolerance  Patient tolerated treatment well    Behavior During Therapy  [facility] for tasks assessed/performed       Past Medical History:  Diagnosis Date  . Allergy   . Anxiety   . Cataract   . Chronic foot pain   . Chronic kidney disease   . Chronic leg pain   . Depression 2016  . Gout   . Hiatal hernia   . Hyperlipidemia   . Hypertension   . Renal insufficiency   . Varicose veins     Past Surgical History:  Procedure Laterality Date  . CARPAL TUNNEL RELEASE Left   . CARPAL TUNNEL RELEASE Bilateral   . CHOLECYSTECTOMY  2026  . COLONOSCOPY    . COLONOSCOPY W/ POLYPECTOMY    . EYE SURGERY Bilateral 1958, 2004   cataracts  . INGUINAL HERNIA REPAIR    . JOINT REPLACEMENT  2002  . TOTAL KNEE ARTHROPLASTY Right 2002  . ulnar intrapment release  07/2008  . VARICOSE VEIN SURGERY Left 1980's per patient   Dr. Maurene Capes    There were no vitals filed for this visit.   Subjective Assessment - 10/11/17 1326    Subjective  The patient presents to PT with chronic low back pain that he said he has had since he was a teenager.  Today is a "good day" with a rating of 4/10 but his pain can become severe at times with increased walking and lifting.  His sleep is alos disturbed by pain.  Pain radiates into bilateral hips.    Pertinent History  Chronic leg pain, right total knee replacement with persistent pain.    Limitations  Walking    How long can you walk  comfortably?  Short community distances.    Patient Stated Goals  Reduce pain.    Currently in Pain?  Yes    Pain Score  4     Pain Location  Back    Pain Orientation  Right;Left;Mid;Lower    Pain Descriptors / Indicators  Aching    Pain Type  Chronic pain    Pain Radiating Towards  Both hips.         [facility] PT Assessment - 10/11/17 0001      Assessment   Medical Diagnosis  Low back pain.    Referring Provider  Kenn File MD.    Onset Date/Surgical Date  -- Since has was a teenager.      Precautions   Precautions  -- Chronic right knee pain.      Restrictions   Weight Bearing Restrictions  No      Balance Screen   Has the patient fallen in the past 6 months  No    Has the patient had a decrease in activity level because of a fear of falling?   Yes    Is the patient reluctant to **Note De-Identified Prosise Obfuscation** leave their home because of a fear of falling?   No      Home Environment   Living Environment  Private residence      Prior Function   Level of Independence  Independent      Posture/Postural Control   Posture/Postural Control  Postural limitations    Postural Limitations  Rounded Shoulders;Forward head;Decreased lumbar lordosis;Flexed trunk      ROM / Strength   AROM / PROM / Strength  AROM;Strength      AROM   Overall AROM Comments  Active lumbar flexion essentially full range and extension to 20 degrees.  Patient stating "I'm having a good day." -5 degrees of right knee extension.      Strength   Overall Strength Comments  Normal bilateral LE strength.      Palpation   Palpation comment  Patient c/o pain across his lower lumbar region with increased tone in her right QL at level L3.      Special Tests   Other special tests  Unable to elicit bilateral LE DTR's; mild leg leg discrepancy most likely due to a 5 degrees right knee flexion contracture; no increased pain with SLR and FABER testing.      Ambulation/Gait   Gait Comments  Patient walking in some trunk flexion in response  to pain.                Objective measurements completed on examination: See above findings.      [facility] Adult PT Treatment/Exercise - 10/11/17 0001      Modalities   Modalities  Electrical Stimulation;Moist Heat      Moist Heat Therapy   Number Minutes Moist Heat  15 Minutes    Moist Heat Location  Lumbar Spine      Electrical Stimulation   Electrical Stimulation Location  Low back.    Electrical Stimulation Action  IFC    Electrical Stimulation Parameters  80-150 Hz x 15 minutes.    Electrical Stimulation Goals  Tone;Pain               PT Short Term Goals - 10/11/17 1344      PT SHORT TERM GOAL #1   Title  STG's=LTG's.        PT Long Term Goals - 10/11/17 1344      PT LONG TERM GOAL #1   Title  Independent with a HEP.    Time  6    Period  Weeks    Status  New      PT LONG TERM GOAL #2   Title  Walk a community distance wiht pain not > 4/10.    Time  6    Period  Weeks    Status  New      PT LONG TERM GOAL #3   Title  Eliminate radiation of pain into bilateral hips.    Time  6    Period  Weeks    Status  New      PT LONG TERM GOAL #4   Title  Perform ADL's with pain not > 3/10.    Time  6    Period  Weeks    Status  New      PT LONG TERM GOAL #5   Title  Sleep undisturbed 6 hours.    Time  6    Period  Weeks    Status  New             Plan - **Note De-Identified Romack Obfuscation** 10/11/17 1340    Clinical Impression Statement  The patient presents to OPPT with c/o chronic low back pain that increases with walking and radiation of pain into bilateral hips.  He has had pain since he was a teenager.  His pain impairs his functional mobility.  He c/o pain over his bilateral lower lumbar region and has increased tonein his right QL.      History and Personal Factors relevant to plan of care:  Lonh history of low back pain.  Right knee pain and loss of extension.    Clinical Presentation  Evolving    Clinical Presentation due to:  Not improving.    Clinical Decision  Making  Low    Rehab Potential  Good    PT Frequency  2x / week    PT Duration  6 weeks    PT Treatment/Interventions  ADLs/Self Care Home Management;Cryotherapy;Electrical Stimulation;Ultrasound;Moist Heat;Traction;Therapeutic activities;Therapeutic exercise;Patient/family education;Manual techniques;Dry needling    PT Next Visit Plan  Please begin with conservativ treatments of modalites to lumbar spine and STW/M with progression into low-level core exercises.    Consulted and Agree with Plan of Care  Patient       Patient will benefit from skilled therapeutic intervention in order to improve the following deficits and impairments:  Decreased activity tolerance, Pain, Postural dysfunction, Impaired tone  Visit Diagnosis: Chronic bilateral low back pain, with sciatica presence unspecified - Plan: PT plan of care cert/re-cert  Abnormal posture - Plan: PT plan of care cert/re-cert     Problem List Patient Active Problem List   Diagnosis Date Noted  . Pre-diabetes 10/26/2016  . Hypertriglyceridemia 10/26/2016  . Contact dermatitis 06/27/2016  . Anxiety state 03/07/2016  . Chronic pain syndrome 07/23/2015  . Obesity (BMI 30-39.9) 08/30/2013  . History of colon polyps 02/17/2010  . ACTINIC KERATOSIS 08/21/2009  . BACK PAIN, CHRONIC 02/18/2009  . ERECTILE DYSFUNCTION 08/04/2008  . ULNAR NEUROPATHY, LEFT 06/03/2008  . BPH (benign prostatic hyperplasia) 06/03/2008  . ELEVATED PROSTATE SPECIFIC ANTIGEN 03/03/2008  . Gout 08/03/2007  . ALLERGIC RHINITIS 07/06/2007  . UNS ADVRS EFF UNS RX MEDICINAL&BIOLOGICAL SBSTNC 06/27/2007  . HYDROCELE, RIGHT 04/09/2007  . Essential hypertension 03/02/2007  . VARICOSE VEINS LOWER EXTREMITIES W/INFLAMMATION 03/02/2007  . HLD (hyperlipidemia) 12/04/2006  . RENAL INSUFFICIENCY 12/04/2006    Daniel Brock, [place] MPT 10/11/2017, 2:01 PM  [facility] [address] [place], [place], [address] Phone:  [phone]   Fax:  [phone]  Name: Daniel Brock MRN: [id number] Date of Birth: 1945-08-22

## 2017-10-12 ENCOUNTER — Encounter: Payer: Self-pay | Admitting: Physical Therapy

## 2017-10-12 ENCOUNTER — Ambulatory Visit: Payer: BLUE CROSS/BLUE SHIELD | Admitting: Physical Therapy

## 2017-10-12 DIAGNOSIS — G8929 Other chronic pain: Secondary | ICD-10-CM

## 2017-10-12 DIAGNOSIS — R293 Abnormal posture: Secondary | ICD-10-CM | POA: Diagnosis not present

## 2017-10-12 DIAGNOSIS — M545 Low back pain: Secondary | ICD-10-CM | POA: Diagnosis not present

## 2017-10-12 NOTE — Therapy (Signed)
**Note De-Identified Friedl Obfuscation** [facility] [place], [place], [address] Phone: [phone]   Fax:  [phone]  Physical Therapy Treatment  Patient Details  Name: Daniel Brock MRN: [id number] Date of Birth: 06-14-1945 Referring Provider: Kenn File MD.   Encounter Date: 10/12/2017  PT End of Session - 10/12/17 0940    Visit Number  2    Number of Visits  12    Date for PT Re-Evaluation  11/22/17    PT Start Time  0901    PT Stop Time  0957    PT Time Calculation (min)  56 min    Activity Tolerance  Patient tolerated treatment well    Behavior During Therapy  [facility] for tasks assessed/performed       Past Medical History:  Diagnosis Date  . Allergy   . Anxiety   . Cataract   . Chronic foot pain   . Chronic kidney disease   . Chronic leg pain   . Depression 2016  . Gout   . Hiatal hernia   . Hyperlipidemia   . Hypertension   . Renal insufficiency   . Varicose veins     Past Surgical History:  Procedure Laterality Date  . CARPAL TUNNEL RELEASE Left   . CARPAL TUNNEL RELEASE Bilateral   . CHOLECYSTECTOMY  2026  . COLONOSCOPY    . COLONOSCOPY W/ POLYPECTOMY    . EYE SURGERY Bilateral 1958, 2004   cataracts  . INGUINAL HERNIA REPAIR    . JOINT REPLACEMENT  2002  . TOTAL KNEE ARTHROPLASTY Right 2002  . ulnar intrapment release  07/2008  . VARICOSE VEIN SURGERY Left 1980's per patient   Dr. Maurene Capes    There were no vitals filed for this visit.  Subjective Assessment - 10/12/17 0902    Subjective  Patient unsure of what triggered back pain, yet ongoing    Pertinent History  Chronic leg pain, right total knee replacement with persistent pain.    Limitations  Walking    How long can you walk comfortably?  Short community distances.    Patient Stated Goals  Reduce pain.    Currently in Pain?  Yes    Pain Score  5     Pain Location  Back    Pain Orientation  Right;Left;Mid;Lower    Pain Descriptors / Indicators  Aching;Sore    Pain Type   Chronic pain    Pain Onset  More than a month ago    Pain Frequency  Constant    Aggravating Factors   unsure    Pain Relieving Factors  unsure                       [facility] Adult PT Treatment/Exercise - 10/12/17 0001      Self-Care   Self-Care  ADL's;Lifting;Posture;Other Self-Care Comments    Other Self-Care Comments   HEP provided for all above      Exercises   Exercises  Lumbar      Lumbar Exercises: Supine   Ab Set  20 reps;3 seconds    Glut Set  20 reps;3 seconds    Bent Knee Raise  3 seconds 2x10    Straight Leg Raise  3 seconds 2x10      Moist Heat Therapy   Number Minutes Moist Heat  15 Minutes    Moist Heat Location  Lumbar Spine      Electrical Stimulation   Electrical Stimulation Location  Low back. **Note De-Identified Velaquez Obfuscation** Electrical Stimulation Action  IFC    Electrical Stimulation Parameters  80-150hz  x11min    Electrical Stimulation Goals  Tone;Pain      Manual Therapy   Manual Therapy  Soft tissue mobilization;Myofascial release    Manual therapy comments  manual STW/MFR to low and mid back paraspinals to reduce pain and tone    Soft tissue mobilization  manual STW to Rt QL to reduce pain             PT Education - 10/12/17 0916    Education Details  HEP/ posture awareness techniques    Person(s) Educated  Patient    Methods  Explanation;Demonstration;Handout    Comprehension  Verbalized understanding;Returned demonstration       PT Short Term Goals - 10/11/17 1344      PT SHORT TERM GOAL #1   Title  STG's=LTG's.        PT Long Term Goals - 10/12/17 0940      PT LONG TERM GOAL #1   Title  Independent with a HEP.    Time  6    Period  Weeks    Status  On-going      PT LONG TERM GOAL #2   Title  Walk a community distance wiht pain not > 4/10.    Time  6    Period  Weeks    Status  On-going      PT LONG TERM GOAL #3   Title  Eliminate radiation of pain into bilateral hips.    Time  6    Period  Weeks    Status  On-going      PT  LONG TERM GOAL #4   Title  Perform ADL's with pain not > 3/10.    Time  6    Period  Weeks    Status  On-going      PT LONG TERM GOAL #5   Title  Sleep undisturbed 6 hours.    Time  6    Period  Weeks    Status  On-going            Plan - 10/12/17 0941    Clinical Impression Statement  Patient tolerated treatment well today. Today Educated patient on posture awareness techniques and core activation exercises with HEP provided, followed by manual STW to bil low back to reduce pain and tone. Patient reported ongoing back pain that is 4-5/10 and increases up to 10/10 pain for unknown reason. Patient is retired and does a lot of sitting and laying around. Educated patient on ADL's movement and rest breaks with getting up 1 time an hour for a gentle staning stretch and daily gentle core activation to start with. Goals ongoing at this time.     Rehab Potential  Good    PT Frequency  2x / week    PT Duration  6 weeks    PT Treatment/Interventions  ADLs/Self Care Home Management;Cryotherapy;Electrical Stimulation;Ultrasound;Moist Heat;Traction;Therapeutic activities;Therapeutic exercise;Patient/family education;Manual techniques;Dry needling    PT Next Visit Plan  cont with conservativ treatments of modalites to lumbar spine and STW/M with progression into low-level core exercises.    Consulted and Agree with Plan of Care  Patient       Patient will benefit from skilled therapeutic intervention in order to improve the following deficits and impairments:  Decreased activity tolerance, Pain, Postural dysfunction, Impaired tone  Visit Diagnosis: Chronic bilateral low back pain, with sciatica presence unspecified  Abnormal posture **Note De-Identified Loux Obfuscation** Problem List Patient Active Problem List   Diagnosis Date Noted  . Pre-diabetes 10/26/2016  . Hypertriglyceridemia 10/26/2016  . Contact dermatitis 06/27/2016  . Anxiety state 03/07/2016  . Chronic pain syndrome 07/23/2015  . Obesity (BMI 30-39.9)  08/30/2013  . History of colon polyps 02/17/2010  . ACTINIC KERATOSIS 08/21/2009  . BACK PAIN, CHRONIC 02/18/2009  . ERECTILE DYSFUNCTION 08/04/2008  . ULNAR NEUROPATHY, LEFT 06/03/2008  . BPH (benign prostatic hyperplasia) 06/03/2008  . ELEVATED PROSTATE SPECIFIC ANTIGEN 03/03/2008  . Gout 08/03/2007  . ALLERGIC RHINITIS 07/06/2007  . UNS ADVRS EFF UNS RX MEDICINAL&BIOLOGICAL SBSTNC 06/27/2007  . HYDROCELE, RIGHT 04/09/2007  . Essential hypertension 03/02/2007  . VARICOSE VEINS LOWER EXTREMITIES W/INFLAMMATION 03/02/2007  . HLD (hyperlipidemia) 12/04/2006  . RENAL INSUFFICIENCY 12/04/2006    Lynix Bonine P, PTA 10/12/2017, 9:57 AM  [facility] [place], [place], [address] Phone: [phone]   Fax:  [phone]  Name: Daniel Brock MRN: [id number] Date of Birth: 02/26/1946

## 2017-10-12 NOTE — Patient Instructions (Signed)
**Note De-Identified Dicaprio Obfuscation** Pelvic Tilt: Posterior - Legs Bent (Supine)   Tighten stomach and flatten back by rolling pelvis down. Hold _10___ seconds. Relax. Repeat _10-30___ times per set. Do __2__ sets per session. Do _2___ sessions per day.   . Straight Leg Raise   Tighten stomach and slowly raise locked right leg __4__ inches from floor. Repeat __10-30__ times per set. Do __2__ sets per session. Do __2__ sessions per day.    Bent Leg Lift (Hook-Lying)   Tighten stomach and slowly raise right leg _5___ inches from floor. Keep trunk rigid. Hold _3___ seconds. Repeat _10___ times per set. Do ___2-3_ sets per session. Do __2__ sessions per day.  Brushing Teeth    Place one foot on ledge and one hand on counter. Bend other knee slightly to keep back straight.  Copyright  VHI. All rights reserved.  Refrigerator   Squat with knees apart to reach lower shelves and drawers.   Copyright  VHI. All rights reserved.  Laundry [employer] down and hold basket close to stand. Use leg muscles to do the work.   Copyright  VHI. All rights reserved.  Housework - Vacuuming   Hold the vacuum with arm held at side. Step back and forth to move it, keeping head up. Avoid twisting.   Copyright  VHI. All rights reserved.  Housework - Wiping   Position yourself as close as possible to reach work surface. Avoid straining your back.   Copyright  VHI. All rights reserved.  Gardening - Mowing   Keep arms close to sides and walk with lawn mower.   Copyright  VHI. All rights reserved.  Sleeping on Side   Place pillow between knees. Use cervical support under neck and a roll around waist as needed.   Copyright  VHI. All rights reserved.  Log Roll   Lying on back, bend left knee and place left arm across chest. Roll all in one movement to the right. Reverse to roll to the left. Always move as one unit.   Copyright  VHI. All rights reserved.  Stand to Sit / Sit to Stand   To sit: Bend  knees to lower self onto front edge of chair, then scoot back on seat. To stand: Reverse sequence by placing one foot forward, and scoot to front of seat. Use rocking motion to stand up.  Copyright  VHI. All rights reserved.  Posture - Standing   Good posture is important. Avoid slouching and forward head thrust. Maintain curve in low back and align ears over shoul- ders, hips over ankles.   Copyright  VHI. All rights reserved.  Posture - Sitting   Sit upright, head facing forward. Try using a roll to support lower back. Keep shoulders relaxed, and avoid rounded back. Keep hips level with knees. Avoid crossing legs for long periods.   Copyright  VHI. All rights reserved.  Computer Work   Position work to Programmer, multimedia. Use proper work and seat height. Keep shoulders back and down, wrists straight, and elbows at right angles. Use chair that provides full back support. Add footrest and lumbar roll as needed.   Copyright  VHI. All rights reserved.

## 2017-10-16 ENCOUNTER — Ambulatory Visit: Payer: BLUE CROSS/BLUE SHIELD | Admitting: Physical Therapy

## 2017-10-16 ENCOUNTER — Encounter: Payer: Self-pay | Admitting: Physical Therapy

## 2017-10-16 DIAGNOSIS — M545 Low back pain: Principal | ICD-10-CM

## 2017-10-16 DIAGNOSIS — R293 Abnormal posture: Secondary | ICD-10-CM | POA: Diagnosis not present

## 2017-10-16 DIAGNOSIS — G8929 Other chronic pain: Secondary | ICD-10-CM | POA: Diagnosis not present

## 2017-10-16 NOTE — Therapy (Signed)
**Note De-Identified Alberico Obfuscation** [facility] [place], [place], [address] Phone: [phone]   Fax:  [phone]  Physical Therapy Treatment  Patient Details  Name: Daniel Brock MRN: [id number] Date of Birth: 02-Nov-1945 Referring Provider: Kenn File MD.   Encounter Date: 10/16/2017  PT End of Session - 10/16/17 1218    Visit Number  4    Number of Visits  12    Date for PT Re-Evaluation  11/22/17    PT Start Time  0945    PT Stop Time  1037    PT Time Calculation (min)  52 min       Past Medical History:  Diagnosis Date  . Allergy   . Anxiety   . Cataract   . Chronic foot pain   . Chronic kidney disease   . Chronic leg pain   . Depression 2016  . Gout   . Hiatal hernia   . Hyperlipidemia   . Hypertension   . Renal insufficiency   . Varicose veins     Past Surgical History:  Procedure Laterality Date  . CARPAL TUNNEL RELEASE Left   . CARPAL TUNNEL RELEASE Bilateral   . CHOLECYSTECTOMY  2026  . COLONOSCOPY    . COLONOSCOPY W/ POLYPECTOMY    . EYE SURGERY Bilateral 1958, 2004   cataracts  . INGUINAL HERNIA REPAIR    . JOINT REPLACEMENT  2002  . TOTAL KNEE ARTHROPLASTY Right 2002  . ulnar intrapment release  07/2008  . VARICOSE VEIN SURGERY Left 1980's per patient   Dr. Maurene Capes    There were no vitals filed for this visit.  Subjective Assessment - 10/16/17 1212    Subjective  About the same.    Patient Stated Goals  Reduce pain.    Currently in Pain?  Yes    Pain Score  5     Pain Location  Back    Pain Orientation  Right;Left;Mid;Lower    Pain Descriptors / Indicators  Aching    Pain Onset  More than a month ago                       [facility] Adult PT Treatment/Exercise - 10/16/17 0001      Modalities   Modalities  Electrical Stimulation;Moist Heat;Ultrasound      Moist Heat Therapy   Number Minutes Moist Heat  20 Minutes    Moist Heat Location  Lumbar Spine      Electrical Stimulation   Electrical  Stimulation Location  Low back.    Electrical Stimulation Action  IFC    Electrical Stimulation Parameters  80-150 Hz x 20 minutes.    Electrical Stimulation Goals  Tone;Pain      Ultrasound   Ultrasound Location  -- Affected low back.    Ultrasound Parameters  1.50 W/CM2 combo e'stim/U/S x 12 minutes.      Manual Therapy   Manual Therapy  Soft tissue mobilization    Soft tissue mobilization  In prone over 2 pillows:  STW/M x 11 minutes to right low back including QL release technqiue.               PT Short Term Goals - 10/11/17 1344      PT SHORT TERM GOAL #1   Title  STG's=LTG's.        PT Long Term Goals - 10/12/17 0940      PT LONG TERM GOAL #1   Title  Independent with a **Note De-Identified Phoenix Obfuscation** HEP.    Time  6    Period  Weeks    Status  On-going      PT LONG TERM GOAL #2   Title  Walk a community distance wiht pain not > 4/10.    Time  6    Period  Weeks    Status  On-going      PT LONG TERM GOAL #3   Title  Eliminate radiation of pain into bilateral hips.    Time  6    Period  Weeks    Status  On-going      PT LONG TERM GOAL #4   Title  Perform ADL's with pain not > 3/10.    Time  6    Period  Weeks    Status  On-going      PT LONG TERM GOAL #5   Title  Sleep undisturbed 6 hours.    Time  6    Period  Weeks    Status  On-going            Plan - 10/16/17 1221    Clinical Impression Statement  Excellent response to treatment today with good release of right QL.    PT Treatment/Interventions  ADLs/Self Care Home Management;Cryotherapy;Electrical Stimulation;Ultrasound;Moist Heat;Traction;Therapeutic activities;Therapeutic exercise;Patient/family education;Manual techniques;Dry needling    PT Next Visit Plan  cont with conservativ treatments of modalites to lumbar spine and STW/M with progression into low-level core exercises.    Consulted and Agree with Plan of Care  Patient       Patient will benefit from skilled therapeutic intervention in order to improve  the following deficits and impairments:     Visit Diagnosis: Chronic bilateral low back pain, with sciatica presence unspecified  Abnormal posture     Problem List Patient Active Problem List   Diagnosis Date Noted  . Pre-diabetes 10/26/2016  . Hypertriglyceridemia 10/26/2016  . Contact dermatitis 06/27/2016  . Anxiety state 03/07/2016  . Chronic pain syndrome 07/23/2015  . Obesity (BMI 30-39.9) 08/30/2013  . History of colon polyps 02/17/2010  . ACTINIC KERATOSIS 08/21/2009  . BACK PAIN, CHRONIC 02/18/2009  . ERECTILE DYSFUNCTION 08/04/2008  . ULNAR NEUROPATHY, LEFT 06/03/2008  . BPH (benign prostatic hyperplasia) 06/03/2008  . ELEVATED PROSTATE SPECIFIC ANTIGEN 03/03/2008  . Gout 08/03/2007  . ALLERGIC RHINITIS 07/06/2007  . UNS ADVRS EFF UNS RX MEDICINAL&BIOLOGICAL SBSTNC 06/27/2007  . HYDROCELE, RIGHT 04/09/2007  . Essential hypertension 03/02/2007  . VARICOSE VEINS LOWER EXTREMITIES W/INFLAMMATION 03/02/2007  . HLD (hyperlipidemia) 12/04/2006  . RENAL INSUFFICIENCY 12/04/2006    Ariannah Arenson, [place] MPT 10/16/2017, 12:22 PM  [facility] [place], [place], [address] Phone: [phone]   Fax:  [phone]  Name: Daniel Brock MRN: [id number] Date of Birth: 1946/03/13

## 2017-10-17 ENCOUNTER — Ambulatory Visit: Payer: BLUE CROSS/BLUE SHIELD | Admitting: Physical Therapy

## 2017-10-17 DIAGNOSIS — M545 Low back pain: Secondary | ICD-10-CM | POA: Diagnosis not present

## 2017-10-17 DIAGNOSIS — R293 Abnormal posture: Secondary | ICD-10-CM | POA: Diagnosis not present

## 2017-10-17 DIAGNOSIS — G8929 Other chronic pain: Secondary | ICD-10-CM

## 2017-10-17 NOTE — Therapy (Signed)
**Note De-Identified [place] Obfuscation** [facility] [place], [place], [address] Phone: [phone]   Fax:  [phone]  Physical Therapy Treatment  Patient Details  Name: Daniel Brock MRN: [id number] Date of Birth: 23-Mar-1945 Referring Provider: Kenn File MD.   Encounter Date: 10/17/2017  PT End of Session - 10/17/17 0950    Visit Number  5    Number of Visits  12    Date for PT Re-Evaluation  11/22/17    PT Start Time  0947    PT Stop Time  1039    PT Time Calculation (min)  52 min    Activity Tolerance  Patient tolerated treatment well    Behavior During Therapy  [facility] for tasks assessed/performed       Past Medical History:  Diagnosis Date  . Allergy   . Anxiety   . Cataract   . Chronic foot pain   . Chronic kidney disease   . Chronic leg pain   . Depression 2016  . Gout   . Hiatal hernia   . Hyperlipidemia   . Hypertension   . Renal insufficiency   . Varicose veins     Past Surgical History:  Procedure Laterality Date  . CARPAL TUNNEL RELEASE Left   . CARPAL TUNNEL RELEASE Bilateral   . CHOLECYSTECTOMY  2026  . COLONOSCOPY    . COLONOSCOPY W/ POLYPECTOMY    . EYE SURGERY Bilateral 1958, 2004   cataracts  . INGUINAL HERNIA REPAIR    . JOINT REPLACEMENT  2002  . TOTAL KNEE ARTHROPLASTY Right 2002  . ulnar intrapment release  07/2008  . VARICOSE VEIN SURGERY Left 1980's per patient   Dr. Maurene Capes    There were no vitals filed for this visit.  Subjective Assessment - 10/17/17 0951    Subjective  Feels a little better    Pertinent History  Chronic leg pain, right total knee replacement with persistent pain.    Limitations  Walking    How long can you walk comfortably?  Short community distances.    Patient Stated Goals  Reduce pain.    Currently in Pain?  Yes    Pain Score  4     Pain Location  Back    Pain Orientation  Right;Left;Mid;Lower    Pain Descriptors / Indicators  Aching    Pain Type  Chronic pain    Pain Onset  More  than a month ago                       [facility] Adult PT Treatment/Exercise - 10/17/17 0001      Lumbar Exercises: Stretches   Other Lumbar Stretch Exercise  supine rotation stretch bil x 60 sec each arms in T position    Other Lumbar Stretch Exercise  prone hip flexor stretch x 2 min      Lumbar Exercises: Supine   Pelvic Tilt  10 reps    Bent Knee Raise  10 reps;20 reps bil with back flat; then alt knee bend    Bridge  5 seconds;20 reps    Straight Leg Raise  10 reps bil      Lumbar Exercises: Prone   Straight Leg Raise  10 reps    Other Prone Lumbar Exercises  pelvic press 5 sec hold x 10;       Lumbar Exercises: Quadruped   [place]  5 reps      Modalities   Modalities  Electrical Stimulation;Moist **Note De-Identified [place] Obfuscation** Heat      Moist Heat Therapy   Number Minutes Moist Heat  15 Minutes    Moist Heat Location  Lumbar Spine      Electrical Stimulation   Electrical Stimulation Location  mid and low back    Electrical Stimulation Action  IFC    Electrical Stimulation Parameters  80-150 Hz x 93min    Electrical Stimulation Goals  Pain               PT Short Term Goals - 10/11/17 1344      PT SHORT TERM GOAL #1   Title  STG's=LTG's.        PT Long Term Goals - 10/12/17 0940      PT LONG TERM GOAL #1   Title  Independent with a HEP.    Time  6    Period  Weeks    Status  On-going      PT LONG TERM GOAL #2   Title  Walk a community distance wiht pain not > 4/10.    Time  6    Period  Weeks    Status  On-going      PT LONG TERM GOAL #3   Title  Eliminate radiation of pain into bilateral hips.    Time  6    Period  Weeks    Status  On-going      PT LONG TERM GOAL #4   Title  Perform ADL's with pain not > 3/10.    Time  6    Period  Weeks    Status  On-going      PT LONG TERM GOAL #5   Title  Sleep undisturbed 6 hours.    Time  6    Period  Weeks    Status  On-going            Plan - 10/17/17 1025    Clinical Impression Statement   Patient did very well with TE today. He needs cueing for correct TA contraction.    PT Treatment/Interventions  ADLs/Self Care Home Management;Cryotherapy;Electrical Stimulation;Ultrasound;Moist Heat;Traction;Therapeutic activities;Therapeutic exercise;Patient/family education;Manual techniques;Dry needling    PT Next Visit Plan  Continue with lumbar stabilization and flexibiltiy.        Patient will benefit from skilled therapeutic intervention in order to improve the following deficits and impairments:  Decreased activity tolerance, Pain, Postural dysfunction, Impaired tone  Visit Diagnosis: Chronic bilateral low back pain, with sciatica presence unspecified     Problem List Patient Active Problem List   Diagnosis Date Noted  . Pre-diabetes 10/26/2016  . Hypertriglyceridemia 10/26/2016  . Contact dermatitis 06/27/2016  . Anxiety state 03/07/2016  . Chronic pain syndrome 07/23/2015  . Obesity (BMI 30-39.9) 08/30/2013  . History of colon polyps 02/17/2010  . ACTINIC KERATOSIS 08/21/2009  . BACK PAIN, CHRONIC 02/18/2009  . ERECTILE DYSFUNCTION 08/04/2008  . ULNAR NEUROPATHY, LEFT 06/03/2008  . BPH (benign prostatic hyperplasia) 06/03/2008  . ELEVATED PROSTATE SPECIFIC ANTIGEN 03/03/2008  . Gout 08/03/2007  . ALLERGIC RHINITIS 07/06/2007  . UNS ADVRS EFF UNS RX MEDICINAL&BIOLOGICAL SBSTNC 06/27/2007  . HYDROCELE, RIGHT 04/09/2007  . Essential hypertension 03/02/2007  . VARICOSE VEINS LOWER EXTREMITIES W/INFLAMMATION 03/02/2007  . HLD (hyperlipidemia) 12/04/2006  . RENAL INSUFFICIENCY 12/04/2006    Brix Brearley PT 10/17/2017, 10:29 AM  [facility] [place], [place], [address] Phone: [phone]   Fax:  [phone]  Name: Daniel Brock MRN: [id number] Date of Birth: 04-13-1945

## 2017-10-25 ENCOUNTER — Encounter: Payer: Self-pay | Admitting: Physical Therapy

## 2017-10-25 ENCOUNTER — Ambulatory Visit: Payer: BLUE CROSS/BLUE SHIELD | Attending: Family Medicine | Admitting: Physical Therapy

## 2017-10-25 ENCOUNTER — Ambulatory Visit: Payer: BLUE CROSS/BLUE SHIELD | Admitting: Cardiovascular Disease

## 2017-10-25 DIAGNOSIS — G8929 Other chronic pain: Secondary | ICD-10-CM | POA: Insufficient documentation

## 2017-10-25 DIAGNOSIS — R293 Abnormal posture: Secondary | ICD-10-CM | POA: Diagnosis not present

## 2017-10-25 DIAGNOSIS — M545 Low back pain: Secondary | ICD-10-CM | POA: Diagnosis not present

## 2017-10-25 NOTE — Therapy (Signed)
**Note De-Identified [place] Obfuscation** [facility] [place], [place], [address] Phone: [phone]   Fax:  [phone]  Physical Therapy Treatment  Patient Details  Name: Daniel Brock MRN: [id number] Date of Birth: December 02, 1945 Referring Provider: Kenn File MD.   Encounter Date: 10/25/2017  PT End of Session - 10/25/17 0925    Visit Number  6    Number of Visits  12    Date for PT Re-Evaluation  11/22/17    PT Start Time  0903    PT Stop Time  0944    PT Time Calculation (min)  41 min    Activity Tolerance  Patient tolerated treatment well    Behavior During Therapy  [facility] for tasks assessed/performed       Past Medical History:  Diagnosis Date  . Allergy   . Anxiety   . Cataract   . Chronic foot pain   . Chronic kidney disease   . Chronic leg pain   . Depression 2016  . Gout   . Hiatal hernia   . Hyperlipidemia   . Hypertension   . Renal insufficiency   . Varicose veins     Past Surgical History:  Procedure Laterality Date  . CARPAL TUNNEL RELEASE Left   . CARPAL TUNNEL RELEASE Bilateral   . CHOLECYSTECTOMY  2026  . COLONOSCOPY    . COLONOSCOPY W/ POLYPECTOMY    . EYE SURGERY Bilateral 1958, 2004   cataracts  . INGUINAL HERNIA REPAIR    . JOINT REPLACEMENT  2002  . TOTAL KNEE ARTHROPLASTY Right 2002  . ulnar intrapment release  07/2008  . VARICOSE VEIN SURGERY Left 1980's per patient   Dr. Maurene Capes    There were no vitals filed for this visit.  Subjective Assessment - 10/25/17 0905    Subjective  Patient arrived with some ongoing pain    Pertinent History  Chronic leg pain, right total knee replacement with persistent pain.    Limitations  Walking    How long can you walk comfortably?  Short community distances.    Patient Stated Goals  Reduce pain.    Currently in Pain?  Yes    Pain Score  4     Pain Location  Back    Pain Orientation  Right;Left;Mid;Lower    Pain Descriptors / Indicators  Aching    Pain Type  Chronic pain    Pain  Onset  More than a month ago    Pain Frequency  Constant    Aggravating Factors   unsure of what causing increased pain    Pain Relieving Factors  rest and ES                       [facility] Adult PT Treatment/Exercise - 10/25/17 0001      Lumbar Exercises: Aerobic   Nustep  L3 x65min core focus      Lumbar Exercises: Standing   Scapular Retraction  Strengthening;Both;20 reps;Theraband;Limitations;10 reps    Theraband Level (Scapular Retraction)  Other (comment)    Scapular Retraction Limitations  pink XTS    Shoulder Extension  Strengthening;Both;20 reps;Theraband;Limitations;Other (comment);10 reps    Theraband Level (Shoulder Extension)  Other (comment)    Shoulder Extension Limitations  pink XTS      Lumbar Exercises: Supine   Ab Set  20 reps;3 seconds    Glut Set  20 reps;3 seconds    Bridge with clamshell  20 reps;3 seconds    Straight Leg **Note De-Identified [place] Obfuscation** Raise  3 seconds 2x10      Moist Heat Therapy   Number Minutes Moist Heat  15 Minutes    Moist Heat Location  Lumbar Spine      Electrical Stimulation   Electrical Stimulation Location  mid and low back    Electrical Stimulation Action  IFC    Electrical Stimulation Parameters  80-150hz  x31min    Electrical Stimulation Goals  Pain               PT Short Term Goals - 10/11/17 1344      PT SHORT TERM GOAL #1   Title  STG's=LTG's.        PT Long Term Goals - 10/25/17 0932      PT LONG TERM GOAL #1   Title  Independent with a HEP.    Time  6    Period  Weeks    Status  On-going      PT LONG TERM GOAL #2   Title  Walk a community distance wiht pain not > 4/10.    Time  6    Period  Weeks    Status  On-going patient reported more than 4/10 pain with community distance around 5-6/10 10/25/17      PT LONG TERM GOAL #3   Title  Eliminate radiation of pain into bilateral hips.    Time  6    Period  Weeks    Status  On-going no improvement reported 10/25/17      PT LONG TERM GOAL #4   Title  Perform ADL's  with pain not > 3/10.    Time  6    Period  Weeks    Status  On-going      PT LONG TERM GOAL #5   Title  Sleep undisturbed 6 hours.    Time  6    Period  Weeks    Status  On-going            Plan - 10/25/17 0933    Clinical Impression Statement  Patient tolerated treatment well today. Patient able to progress core exercises today. 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 Educated patient on daily core activation throughout day and posture awareness techniques to reduce pain and flareups. Goals ongoing.     Rehab Potential  Good    PT Frequency  2x / week    PT Duration  6 weeks    PT Treatment/Interventions  ADLs/Self Care Home Management;Cryotherapy;Electrical Stimulation;Ultrasound;Moist Heat;Traction;Therapeutic activities;Therapeutic exercise;Patient/family education;Manual techniques;Dry needling    PT Next Visit Plan  assess progression and Continue with lumbar stabilization and flexibiltiy modalities PRN    Consulted and Agree with Plan of Care  Patient       Patient will benefit from skilled therapeutic intervention in order to improve the following deficits and impairments:  Decreased activity tolerance, Pain, Postural dysfunction, Impaired tone  Visit Diagnosis: Chronic bilateral low back pain, with sciatica presence unspecified  Abnormal posture     Problem List Patient Active Problem List   Diagnosis Date Noted  . Pre-diabetes 10/26/2016  . Hypertriglyceridemia 10/26/2016  . Contact dermatitis 06/27/2016  . Anxiety state 03/07/2016  . Chronic pain syndrome 07/23/2015  . Obesity (BMI 30-39.9) 08/30/2013  . History of colon polyps 02/17/2010  . ACTINIC KERATOSIS 08/21/2009  . BACK PAIN, CHRONIC 02/18/2009  . ERECTILE DYSFUNCTION 08/04/2008  . ULNAR NEUROPATHY, LEFT 06/03/2008  . BPH (benign prostatic hyperplasia) 06/03/2008  . ELEVATED **Note De-Identified [place] Obfuscation** PROSTATE SPECIFIC ANTIGEN 03/03/2008  . Gout 08/03/2007  . ALLERGIC  RHINITIS 07/06/2007  . UNS ADVRS EFF UNS RX MEDICINAL&BIOLOGICAL SBSTNC 06/27/2007  . HYDROCELE, RIGHT 04/09/2007  . Essential hypertension 03/02/2007  . VARICOSE VEINS LOWER EXTREMITIES W/INFLAMMATION 03/02/2007  . HLD (hyperlipidemia) 12/04/2006  . RENAL INSUFFICIENCY 12/04/2006    Jaegar Croft P, PTA 10/25/2017, 9:50 AM  [facility] [place], [place], [address] Phone: [phone]   Fax:  [phone]  Name: Daniel Brock MRN: [id number] Date of Birth: [birth year]

## 2017-10-26 ENCOUNTER — Ambulatory Visit: Payer: BLUE CROSS/BLUE SHIELD | Admitting: Physical Therapy

## 2017-10-26 ENCOUNTER — Encounter: Payer: Self-pay | Admitting: Physical Therapy

## 2017-10-26 DIAGNOSIS — G8929 Other chronic pain: Secondary | ICD-10-CM

## 2017-10-26 DIAGNOSIS — M545 Low back pain: Principal | ICD-10-CM

## 2017-10-26 DIAGNOSIS — R293 Abnormal posture: Secondary | ICD-10-CM | POA: Diagnosis not present

## 2017-10-26 NOTE — Therapy (Signed)
**Note De-Identified [place] Obfuscation** [facility] [place], [place], [address] Phone: [phone]   Fax:  [phone]  Physical Therapy Treatment  Patient Details  Name: Daniel Brock MRN: [id number] Date of Birth: March 27, 1945 Referring Provider: Kenn File MD.   Encounter Date: 10/26/2017  PT End of Session - 10/26/17 0923    Visit Number  7    Number of Visits  12    Date for PT Re-Evaluation  11/22/17    PT Start Time  0901    PT Stop Time  0946    PT Time Calculation (min)  45 min    Activity Tolerance  Patient tolerated treatment well    Behavior During Therapy  [facility] for tasks assessed/performed       Past Medical History:  Diagnosis Date  . Allergy   . Anxiety   . Cataract   . Chronic foot pain   . Chronic kidney disease   . Chronic leg pain   . Depression 2016  . Gout   . Hiatal hernia   . Hyperlipidemia   . Hypertension   . Renal insufficiency   . Varicose veins     Past Surgical History:  Procedure Laterality Date  . CARPAL TUNNEL RELEASE Left   . CARPAL TUNNEL RELEASE Bilateral   . CHOLECYSTECTOMY  2026  . COLONOSCOPY    . COLONOSCOPY W/ POLYPECTOMY    . EYE SURGERY Bilateral 1958, 2004   cataracts  . INGUINAL HERNIA REPAIR    . JOINT REPLACEMENT  2002  . TOTAL KNEE ARTHROPLASTY Right 2002  . ulnar intrapment release  07/2008  . VARICOSE VEIN SURGERY Left 1980's per patient   Dr. Maurene Capes    There were no vitals filed for this visit.  Subjective Assessment - 10/26/17 0902    Subjective  Patient arrived with some ongoing pain    Pertinent History  Chronic leg pain, right total knee replacement with persistent pain.    Limitations  Walking    How long can you walk comfortably?  Short community distances.    Patient Stated Goals  Reduce pain.    Currently in Pain?  Yes    Pain Score  3     Pain Location  Back    Pain Orientation  Right;Left;Mid;Lower    Pain Descriptors / Indicators  Aching    Pain Type  Chronic pain    Pain  Onset  More than a month ago    Pain Frequency  Constant    Aggravating Factors   certain movements    Pain Relieving Factors  rest                       [facility] Adult PT Treatment/Exercise - 10/26/17 0001      Exercises   Exercises  Lumbar;Other Exercises    Other Exercises   standing 4# reach outs and wood chops core focus 2x10 each      Pilates   Pilates Reformer  .      Lumbar Exercises: Aerobic   Nustep  L4 x10min core focus      Lumbar Exercises: Standing   Scapular Retraction  Strengthening;Both;20 reps;Theraband;Limitations;10 reps    Theraband Level (Scapular Retraction)  Other (comment)    Scapular Retraction Limitations  pink XTS    Shoulder Extension  Strengthening;Both;20 reps;Theraband;Limitations;Other (comment);10 reps    Theraband Level (Shoulder Extension)  Other (comment)    Shoulder Extension Limitations  pink XTS **Note De-Identified [place] Obfuscation** Lumbar Exercises: Supine   Bridge with clamshell  20 reps;3 seconds    Straight Leg Raise  3 seconds      Moist Heat Therapy   Number Minutes Moist Heat  15 Minutes    Moist Heat Location  Lumbar Spine      Electrical Stimulation   Electrical Stimulation Location  mid and low back    Electrical Stimulation Action  IFC    Electrical Stimulation Parameters  80-150hz  x45min    Electrical Stimulation Goals  Pain                         Plan - 10/26/17 0925    Clinical Impression Statement  Patient tolerated treatment well today. Patient able to progress standing exercises toda with good form and understanding. Patient has some ongoing discomfort yet not as bad. Patient abe to sleep better yet not 6 hours at this time. Patient goals progressing.     Rehab Potential  Good    PT Frequency  2x / week    PT Duration  6 weeks    PT Treatment/Interventions  ADLs/Self Care Home Management;Cryotherapy;Electrical Stimulation;Ultrasound;Moist Heat;Traction;Therapeutic activities;Therapeutic exercise;Patient/family  education;Manual techniques;Dry needling    PT Next Visit Plan  Continue with lumbar stabilization and flexibiltiy modalities PRN    Consulted and Agree with Plan of Care  Patient       Patient will benefit from skilled therapeutic intervention in order to improve the following deficits and impairments:  Decreased activity tolerance, Pain, Postural dysfunction, Impaired tone  Visit Diagnosis: Chronic bilateral low back pain, with sciatica presence unspecified  Abnormal posture     Problem List Patient Active Problem List   Diagnosis Date Noted  . Pre-diabetes 10/26/2016  . Hypertriglyceridemia 10/26/2016  . Contact dermatitis 06/27/2016  . Anxiety state 03/07/2016  . Chronic pain syndrome 07/23/2015  . Obesity (BMI 30-39.9) 08/30/2013  . History of colon polyps 02/17/2010  . ACTINIC KERATOSIS 08/21/2009  . BACK PAIN, CHRONIC 02/18/2009  . ERECTILE DYSFUNCTION 08/04/2008  . ULNAR NEUROPATHY, LEFT 06/03/2008  . BPH (benign prostatic hyperplasia) 06/03/2008  . ELEVATED PROSTATE SPECIFIC ANTIGEN 03/03/2008  . Gout 08/03/2007  . ALLERGIC RHINITIS 07/06/2007  . UNS ADVRS EFF UNS RX MEDICINAL&BIOLOGICAL SBSTNC 06/27/2007  . HYDROCELE, RIGHT 04/09/2007  . Essential hypertension 03/02/2007  . VARICOSE VEINS LOWER EXTREMITIES W/INFLAMMATION 03/02/2007  . HLD (hyperlipidemia) 12/04/2006  . RENAL INSUFFICIENCY 12/04/2006    Phillips Climes, PTA 10/26/2017, 9:53 AM  [facility] [place], [place], [address] Phone: [phone]   Fax:  [phone]  Name: Daniel Brock MRN: [id number] Date of Birth: Mar 07, 1946

## 2017-10-31 ENCOUNTER — Ambulatory Visit: Payer: BLUE CROSS/BLUE SHIELD | Admitting: Physical Therapy

## 2017-10-31 DIAGNOSIS — M545 Low back pain: Principal | ICD-10-CM

## 2017-10-31 DIAGNOSIS — R293 Abnormal posture: Secondary | ICD-10-CM

## 2017-10-31 DIAGNOSIS — G8929 Other chronic pain: Secondary | ICD-10-CM

## 2017-10-31 NOTE — Therapy (Signed)
**Note De-Identified [place] Obfuscation** [facility] [place], [place], [address] Phone: [phone]   Fax:  [phone]  Physical Therapy Treatment  Patient Details  Name: Daniel Brock MRN: [id number] Date of Birth: Sep 24, 1945 Referring Provider: Kenn File MD.   Encounter Date: 10/31/2017  PT End of Session - 10/31/17 0905    Visit Number  7   mistake on count   Number of Visits  12    Date for PT Re-Evaluation  11/22/17    PT Start Time  0900    PT Stop Time  0951    PT Time Calculation (min)  51 min    Activity Tolerance  Patient tolerated treatment well    Behavior During Therapy  [facility] for tasks assessed/performed       Past Medical History:  Diagnosis Date  . Allergy   . Anxiety   . Cataract   . Chronic foot pain   . Chronic kidney disease   . Chronic leg pain   . Depression 2016  . Gout   . Hiatal hernia   . Hyperlipidemia   . Hypertension   . Renal insufficiency   . Varicose veins     Past Surgical History:  Procedure Laterality Date  . CARPAL TUNNEL RELEASE Left   . CARPAL TUNNEL RELEASE Bilateral   . CHOLECYSTECTOMY  2026  . COLONOSCOPY    . COLONOSCOPY W/ POLYPECTOMY    . EYE SURGERY Bilateral 1958, 2004   cataracts  . INGUINAL HERNIA REPAIR    . JOINT REPLACEMENT  2002  . TOTAL KNEE ARTHROPLASTY Right 2002  . ulnar intrapment release  07/2008  . VARICOSE VEIN SURGERY Left 1980's per patient   Dr. Maurene Capes    There were no vitals filed for this visit.  Subjective Assessment - 10/31/17 0941    Subjective  Patient arrives with low back pain is minimal but his mid back is more painful today.    Pertinent History  Chronic leg pain, right total knee replacement with persistent pain.    Limitations  Walking    How long can you walk comfortably?  Short community distances.    Patient Stated Goals  Reduce pain.    Currently in Pain?  Yes    Pain Score  5     Pain Location  Back    Pain Orientation  Right;Left;Mid    Pain  Descriptors / Indicators  Aching    Pain Type  Chronic pain    Pain Onset  More than a month ago    Pain Frequency  Constant         [facility] PT Assessment - 10/31/17 0001      Assessment   Medical Diagnosis  Low back pain.                   [place] Adult PT Treatment/Exercise - 10/31/17 0001      Exercises   Exercises  Lumbar;Other Exercises      Lumbar Exercises: Aerobic   Nustep  L4 x54min core focus      Lumbar Exercises: Standing   Scapular Retraction  Strengthening;Both;20 reps;Theraband;Limitations;10 reps    Scapular Retraction Limitations  pink XTS    Shoulder Extension  Strengthening;Both;20 reps;Theraband;Limitations;Other (comment);10 reps    Shoulder Extension Limitations  Pink XTS      Lumbar Exercises: Supine   Bridge with clamshell  20 reps;3 seconds      Lumbar Exercises: Sidelying   Hip Abduction  Both;20 reps **Note De-Identified [place] Obfuscation** Moist Heat Therapy   Number Minutes Moist Heat  15 Minutes    Moist Heat Location  Lumbar Spine      Electrical Stimulation   Electrical Stimulation Location  mid and low back    Electrical Stimulation Action  IFC    Electrical Stimulation Parameters  80-150 x15    Electrical Stimulation Goals  Pain               PT Short Term Goals - 10/11/17 1344      PT SHORT TERM GOAL #1   Title  STG's=LTG's.        PT Long Term Goals - 10/25/17 0932      PT LONG TERM GOAL #1   Title  Independent with a HEP.    Time  6    Period  Weeks    Status  On-going      PT LONG TERM GOAL #2   Title  Walk a community distance wiht pain not > 4/10.    Time  6    Period  Weeks    Status  On-going   patient reported more than 4/10 pain with community distance around 5-6/10 10/25/17     PT LONG TERM GOAL #3   Title  Eliminate radiation of pain into bilateral hips.    Time  6    Period  Weeks    Status  On-going   no improvement reported 10/25/17     PT LONG TERM GOAL #4   Title  Perform ADL's with pain not > 3/10.    Time  6     Period  Weeks    Status  On-going      PT LONG TERM GOAL #5   Title  Sleep undisturbed 6 hours.    Time  6    Period  Weeks    Status  On-going            Plan - 10/31/17 0942    Clinical Impression Statement  Patient was able to tolerate treatment well with no reports of increased pain throughout exercise. Patient required intermittent verbal cuing and demonstration for rows and extension exercises for proper form. Patient noted with decreased mid back pain at end of session. Normal response to modalities upon removal.    Clinical Presentation  Evolving    Clinical Decision Making  Low    Rehab Potential  Good    PT Frequency  2x / week    PT Duration  6 weeks    PT Treatment/Interventions  ADLs/Self Care Home Management;Cryotherapy;Electrical Stimulation;Ultrasound;Moist Heat;Traction;Therapeutic activities;Therapeutic exercise;Patient/family education;Manual techniques;Dry needling    PT Next Visit Plan  Continue with lumbar stabilization and flexibiltiy modalities PRN    Consulted and Agree with Plan of Care  Patient       Patient will benefit from skilled therapeutic intervention in order to improve the following deficits and impairments:  Decreased activity tolerance, Pain, Postural dysfunction, Impaired tone  Visit Diagnosis: Chronic bilateral low back pain, with sciatica presence unspecified  Abnormal posture     Problem List Patient Active Problem List   Diagnosis Date Noted  . Pre-diabetes 10/26/2016  . Hypertriglyceridemia 10/26/2016  . Contact dermatitis 06/27/2016  . Anxiety state 03/07/2016  . Chronic pain syndrome 07/23/2015  . Obesity (BMI 30-39.9) 08/30/2013  . History of colon polyps 02/17/2010  . ACTINIC KERATOSIS 08/21/2009  . BACK PAIN, CHRONIC 02/18/2009  . ERECTILE DYSFUNCTION 08/04/2008  . ULNAR NEUROPATHY, LEFT 06/03/2008  . BPH (benign **Note De-Identified [place] Obfuscation** prostatic hyperplasia) 06/03/2008  . ELEVATED PROSTATE SPECIFIC ANTIGEN 03/03/2008  . Gout  08/03/2007  . ALLERGIC RHINITIS 07/06/2007  . UNS ADVRS EFF UNS RX MEDICINAL&BIOLOGICAL SBSTNC 06/27/2007  . HYDROCELE, RIGHT 04/09/2007  . Essential hypertension 03/02/2007  . VARICOSE VEINS LOWER EXTREMITIES W/INFLAMMATION 03/02/2007  . HLD (hyperlipidemia) 12/04/2006  . RENAL INSUFFICIENCY 12/04/2006   Gabriela Eves, PT, DPT 10/31/2017, 12:11 PM  [facility] [address] [place], [place], [address] Phone: [phone]   Fax:  [phone]  Name: Daniel Brock MRN: [id number] Date of Birth: Apr 13, 1945

## 2017-11-01 ENCOUNTER — Encounter: Payer: BLUE CROSS/BLUE SHIELD | Admitting: Physical Therapy

## 2017-11-06 ENCOUNTER — Ambulatory Visit: Payer: BLUE CROSS/BLUE SHIELD | Admitting: Physical Therapy

## 2017-11-06 DIAGNOSIS — R293 Abnormal posture: Secondary | ICD-10-CM

## 2017-11-06 DIAGNOSIS — G8929 Other chronic pain: Secondary | ICD-10-CM

## 2017-11-06 DIAGNOSIS — M545 Low back pain: Secondary | ICD-10-CM | POA: Diagnosis not present

## 2017-11-06 NOTE — Therapy (Signed)
**Note De-Identified [place]azos Obfuscation** [facility] [place], [place], [address] Phone: [phone]   Fax:  [phone]  Physical Therapy Treatment  Patient Details  Name: Daniel Brock MRN: [id number] Date of Birth: Mar 11, 1946 Referring Provider: Kenn File MD.   Encounter Date: 11/06/2017  PT End of Session - 11/06/17 0905    Visit Number  8    Number of Visits  12    Date for PT Re-Evaluation  11/22/17    PT Start Time  0900    PT Stop Time  0952    PT Time Calculation (min)  52 min    Activity Tolerance  Patient tolerated treatment well    Behavior During Therapy  [facility] for tasks assessed/performed       Past Medical History:  Diagnosis Date  . Allergy   . Anxiety   . Cataract   . Chronic foot pain   . Chronic kidney disease   . Chronic leg pain   . Depression 2016  . Gout   . Hiatal hernia   . Hyperlipidemia   . Hypertension   . Renal insufficiency   . Varicose veins     Past Surgical History:  Procedure Laterality Date  . CARPAL TUNNEL RELEASE Left   . CARPAL TUNNEL RELEASE Bilateral   . CHOLECYSTECTOMY  2026  . COLONOSCOPY    . COLONOSCOPY W/ POLYPECTOMY    . EYE SURGERY Bilateral 1958, 2004   cataracts  . INGUINAL HERNIA REPAIR    . JOINT REPLACEMENT  2002  . TOTAL KNEE ARTHROPLASTY Right 2002  . ulnar intrapment release  07/2008  . VARICOSE VEIN SURGERY Left 1980's per patient   Dr. Maurene Capes    There were no vitals filed for this visit.  Subjective Assessment - 11/06/17 0902    Subjective  Patient reported feeling tender mostly in thoracic spine.    Pertinent History  Chronic leg pain, right total knee replacement with persistent pain.    Limitations  Walking    How long can you walk comfortably?  Short community distances.    Patient Stated Goals  Reduce pain.    Currently in Pain?  Yes    Pain Score  3     Pain Location  Back    Pain Orientation  Right;Left;Mid    Pain Descriptors / Indicators  Tender    Pain Type  Chronic  pain    Pain Onset  More than a month ago    Pain Frequency  Constant         [facility] PT Assessment - 11/06/17 0001      Assessment   Medical Diagnosis  Low back pain.                   [facility] Adult PT Treatment/Exercise - 11/06/17 0001      Exercises   Exercises  Lumbar      Lumbar Exercises: Stretches   Lower Trunk Rotation  30 seconds;2 reps    Other Lumbar Stretch Exercise  seated T stretch 10" x5      Lumbar Exercises: Aerobic   UBE (Upper Arm Bike)  120 RPM x8 minutes (4 fwd, 4 bwd)    Nustep  L4 x30min core focus      Lumbar Exercises: Standing   Heel Raises  20 reps    Scapular Retraction  Strengthening;Both;20 reps;Theraband;Limitations;10 reps    Scapular Retraction Limitations  pink XTS    Shoulder Extension  Strengthening;Both;20 reps;Theraband;Limitations;Other (comment);10 reps **Note De-Identified Gadea Obfuscation** Shoulder Extension Limitations  Pink XTS      Lumbar Exercises: Supine   Bridge  5 seconds;20 reps      Moist Heat Therapy   Number Minutes Moist Heat  15 Minutes    Moist Heat Location  Lumbar Spine      Electrical Stimulation   Electrical Stimulation Location  mid and low back    Electrical Stimulation Action  IFC    Electrical Stimulation Parameters  80-150 x15 mins    Electrical Stimulation Goals  Pain               PT Short Term Goals - 10/11/17 1344      PT SHORT TERM GOAL #1   Title  STG's=LTG's.        PT Long Term Goals - 10/25/17 0932      PT LONG TERM GOAL #1   Title  Independent with a HEP.    Time  6    Period  Weeks    Status  On-going      PT LONG TERM GOAL #2   Title  Walk a community distance wiht pain not > 4/10.    Time  6    Period  Weeks    Status  On-going   patient reported more than 4/10 pain with community distance around 5-6/10 10/25/17     PT LONG TERM GOAL #3   Title  Eliminate radiation of pain into bilateral hips.    Time  6    Period  Weeks    Status  On-going   no improvement reported 10/25/17     PT LONG  TERM GOAL #4   Title  Perform ADL's with pain not > 3/10.    Time  6    Period  Weeks    Status  On-going      PT LONG TERM GOAL #5   Title  Sleep undisturbed 6 hours.    Time  6    Period  Weeks    Status  On-going            Plan - 11/06/17 0939    Clinical Impression Statement  Patient was able to tolerate treatment well. Patient was able to demonstrate good form with all exercises after initial cuing. Patient denied increase of pain with standing exercises. Normal response to modalities at end of session.     Clinical Presentation  Evolving    Clinical Decision Making  Low    Rehab Potential  Good    PT Frequency  2x / week    PT Duration  6 weeks    PT Treatment/Interventions  ADLs/Self Care Home Management;Cryotherapy;Electrical Stimulation;Ultrasound;Moist Heat;Traction;Therapeutic activities;Therapeutic exercise;Patient/family education;Manual techniques;Dry needling    PT Next Visit Plan  Continue with lumbar stabilization and flexibiltiy modalities PRN    Consulted and Agree with Plan of Care  Patient       Patient will benefit from skilled therapeutic intervention in order to improve the following deficits and impairments:  Decreased activity tolerance, Pain, Postural dysfunction, Impaired tone  Visit Diagnosis: Chronic bilateral low back pain, with sciatica presence unspecified  Abnormal posture     Problem List Patient Active Problem List   Diagnosis Date Noted  . Pre-diabetes 10/26/2016  . Hypertriglyceridemia 10/26/2016  . Contact dermatitis 06/27/2016  . Anxiety state 03/07/2016  . Chronic pain syndrome 07/23/2015  . Obesity (BMI 30-39.9) 08/30/2013  . History of colon polyps 02/17/2010  . ACTINIC KERATOSIS 08/21/2009  . BACK PAIN, **Note De-Identified Deharo Obfuscation** CHRONIC 02/18/2009  . ERECTILE DYSFUNCTION 08/04/2008  . ULNAR NEUROPATHY, LEFT 06/03/2008  . BPH (benign prostatic hyperplasia) 06/03/2008  . ELEVATED PROSTATE SPECIFIC ANTIGEN 03/03/2008  . Gout 08/03/2007  .  ALLERGIC RHINITIS 07/06/2007  . UNS ADVRS EFF UNS RX MEDICINAL&BIOLOGICAL SBSTNC 06/27/2007  . HYDROCELE, RIGHT 04/09/2007  . Essential hypertension 03/02/2007  . VARICOSE VEINS LOWER EXTREMITIES W/INFLAMMATION 03/02/2007  . HLD (hyperlipidemia) 12/04/2006  . RENAL INSUFFICIENCY 12/04/2006   Gabriela Eves, PT, DPT 11/06/2017, 10:28 AM  [facility] [address] [place], [place], [address] Phone: [phone]   Fax:  [phone]  Name: Daniel Brock MRN: [id number] Date of Birth: 17-Nov-1945

## 2017-11-09 ENCOUNTER — Ambulatory Visit: Payer: BLUE CROSS/BLUE SHIELD | Admitting: Physical Therapy

## 2017-11-09 ENCOUNTER — Encounter: Payer: Self-pay | Admitting: Physical Therapy

## 2017-11-09 DIAGNOSIS — G8929 Other chronic pain: Secondary | ICD-10-CM | POA: Diagnosis not present

## 2017-11-09 DIAGNOSIS — M545 Low back pain: Principal | ICD-10-CM

## 2017-11-09 DIAGNOSIS — R293 Abnormal posture: Secondary | ICD-10-CM | POA: Diagnosis not present

## 2017-11-09 NOTE — Therapy (Signed)
**Note De-Identified [place] Obfuscation** [facility] [place], [place], [address] Phone: [phone]   Fax:  [phone]  Physical Therapy Treatment  Patient Details  Name: Daniel Brock MRN: [id number] Date of Birth: 27-Feb-1946 Referring Provider: Kenn File MD.   Encounter Date: 11/09/2017  PT End of Session - 11/09/17 1415    Visit Number  9    Number of Visits  12    Date for PT Re-Evaluation  11/22/17    PT Start Time  8676    PT Stop Time  1425    PT Time Calculation (min)  47 min    Activity Tolerance  Patient tolerated treatment well    Behavior During Therapy  [facility] for tasks assessed/performed       Past Medical History:  Diagnosis Date  . Allergy   . Anxiety   . Cataract   . Chronic foot pain   . Chronic kidney disease   . Chronic leg pain   . Depression 2016  . Gout   . Hiatal hernia   . Hyperlipidemia   . Hypertension   . Renal insufficiency   . Varicose veins     Past Surgical History:  Procedure Laterality Date  . CARPAL TUNNEL RELEASE Left   . CARPAL TUNNEL RELEASE Bilateral   . CHOLECYSTECTOMY  2026  . COLONOSCOPY    . COLONOSCOPY W/ POLYPECTOMY    . EYE SURGERY Bilateral 1958, 2004   cataracts  . INGUINAL HERNIA REPAIR    . JOINT REPLACEMENT  2002  . TOTAL KNEE ARTHROPLASTY Right 2002  . ulnar intrapment release  07/2008  . VARICOSE VEIN SURGERY Left 1980's per patient   Dr. Maurene Capes    There were no vitals filed for this visit.  Subjective Assessment - 11/09/17 1345    Subjective  Patient arrived with less pain today    Pertinent History  Chronic leg pain, right total knee replacement with persistent pain.    Limitations  Walking    How long can you walk comfortably?  Short community distances.    Patient Stated Goals  Reduce pain.    Currently in Pain?  Yes    Pain Score  1     Pain Location  Back    Pain Orientation  Right;Left;Mid    Pain Descriptors / Indicators  Tender    Pain Type  Chronic pain    Pain Onset   More than a month ago    Pain Frequency  Constant    Aggravating Factors   certain movements    Pain Relieving Factors  rest                       [facility] Adult PT Treatment/Exercise - 11/09/17 0001      Lumbar Exercises: Stretches   Lower Trunk Rotation  30 seconds;2 reps      Lumbar Exercises: Aerobic   UBE (Upper Arm Bike)  120 RPM x8 minutes (4 fwd, 4 bwd)   standing and sitting core focus   Nustep  L5-7 x57mn core focus      Lumbar Exercises: Standing   Heel Raises  20 reps    Scapular Retraction  Strengthening;Both;20 reps;Theraband;Limitations;10 reps    Scapular Retraction Limitations  pink XTS    Shoulder Extension  Strengthening;Both;20 reps;Theraband;Limitations;Other (comment);10 reps    Shoulder Extension Limitations  pink XTS      Moist Heat Therapy   Number Minutes Moist Heat  15 Minutes **Note De-Identified R[place]hl Obfuscation** Moist Heat Location  Lumbar Spine      Electrical Stimulation   Electrical Stimulation Location  mid and low back    Electrical Stimulation Action  IFC    Electrical Stimulation Parameters  80-150hz  x83mn    Electrical Stimulation Goals  Pain               PT Short Term Goals - 10/11/17 1344      PT SHORT TERM GOAL #1   Title  STG's=LTG's.        PT Long Term Goals - 11/09/17 1347      PT LONG TERM GOAL #1   Title  Independent with a HEP.    Period  Weeks    Status  On-going      PT LONG TERM GOAL #2   Title  Walk a community distance wiht pain not > 4/10.    Baseline  Met 11/09/17    Time  6    Period  Weeks    Status  Achieved      PT LONG TERM GOAL #3   Title  Eliminate radiation of pain into bilateral hips.    Time  6    Period  Weeks    Status  On-going   less overall symptoms 11/09/17     PT LONG TERM GOAL #4   Title  Perform ADL's with pain not > 3/10.    Time  6    Period  Weeks    Status  On-going      PT LONG TERM GOAL #5   Title  Sleep undisturbed 6 hours.    Time  6    Period  Weeks    Status  On-going    improvement yet ongoing 11/09/17           Plan - 11/09/17 1417    Clinical Impression Statement  Patient reported doing 60% improved overall. Patient tolerated treatment well today. Patientreported feeling much better today and feels like therapy has helped and would like to keep exercises lighter today. Patient met goal #2 today and others progressing.     Rehab Potential  Good    PT Frequency  2x / week    PT Duration  6 weeks    PT Treatment/Interventions  ADLs/Self Care Home Management;Cryotherapy;Electrical Stimulation;Ultrasound;Moist Heat;Traction;Therapeutic activities;Therapeutic exercise;Patient/family education;Manual techniques;Dry needling    PT Next Visit Plan  Continue with lumbar stabilization and flexibiltiy modalities PRN    Consulted and Agree with Plan of Care  Patient       Patient will benefit from skilled therapeutic intervention in order to improve the following deficits and impairments:  Decreased activity tolerance, Pain, Postural dysfunction, Impaired tone  Visit Diagnosis: Chronic bilateral low back pain, with sciatica presence unspecified  Abnormal posture     Problem List Patient Active Problem List   Diagnosis Date Noted  . Pre-diabetes 10/26/2016  . Hypertriglyceridemia 10/26/2016  . Contact dermatitis 06/27/2016  . Anxiety state 03/07/2016  . Chronic pain syndrome 07/23/2015  . Obesity (BMI 30-39.9) 08/30/2013  . History of colon polyps 02/17/2010  . ACTINIC KERATOSIS 08/21/2009  . BACK PAIN, CHRONIC 02/18/2009  . ERECTILE DYSFUNCTION 08/04/2008  . ULNAR NEUROPATHY, LEFT 06/03/2008  . BPH (benign prostatic hyperplasia) 06/03/2008  . ELEVATED PROSTATE SPECIFIC ANTIGEN 03/03/2008  . Gout 08/03/2007  . ALLERGIC RHINITIS 07/06/2007  . UNS ADVRS EFF UNS RX MEDICINAL&BIOLOGICAL SBSTNC 06/27/2007  . HYDROCELE, RIGHT 04/09/2007  . Essential hypertension 03/02/2007  . VARICOSE VEINS LOWER EXTREMITIES **Note De-Identified Carpenter Obfuscation** W/INFLAMMATION 03/02/2007  . HLD  (hyperlipidemia) 12/04/2006  . RENAL INSUFFICIENCY 12/04/2006    Phillips Climes, PTA 11/09/2017, 2:31 PM  [facility] [place], [place], [address] Phone: [phone]   Fax:  [phone]  Name: Kyrillos Adams Bickhart MRN: [id number] Date of Birth: [birth year]

## 2017-11-13 ENCOUNTER — Ambulatory Visit: Payer: BLUE CROSS/BLUE SHIELD | Admitting: Physical Therapy

## 2017-11-13 ENCOUNTER — Encounter: Payer: Self-pay | Admitting: Physical Therapy

## 2017-11-13 DIAGNOSIS — R293 Abnormal posture: Secondary | ICD-10-CM

## 2017-11-13 DIAGNOSIS — M545 Low back pain: Secondary | ICD-10-CM | POA: Diagnosis not present

## 2017-11-13 DIAGNOSIS — G8929 Other chronic pain: Secondary | ICD-10-CM | POA: Diagnosis not present

## 2017-11-13 NOTE — Therapy (Signed)
**Note De-identified [place]ud[place] Obfuscation** [facility] [address] [place], [place], [address] Phone: [phone]   Fax:  [phone]  Physical Therapy Treatment  Patient Details  Name: Daniel Brock MRN: [id number] Date of Birth: 08/29/1945 Referring Provider: Samuel Bradshaw MD.   Encounter Date: 11/13/2017  PT End of Session - 11/13/17 0915    Visit Number  10    Number of Visits  12    Date for PT Re-Evaluation  11/22/17    PT Start Time  0900    PT Stop Time  0955    PT Time Calculation (min)  55 min    Activity Tolerance  Patient tolerated treatment well    Behavior During Therapy  [facility] for tasks assessed/performed       Past Medical History:  Diagnosis Date  . Allergy   . Anxiety   . Cataract   . Chronic foot pain   . Chronic kidney disease   . Chronic leg pain   . Depression 2016  . Gout   . Hiatal hernia   . Hyperlipidemia   . Hypertension   . Renal insufficiency   . Varicose veins     Past Surgical History:  Procedure Laterality Date  . CARPAL TUNNEL RELEASE Left   . CARPAL TUNNEL RELEASE Bilateral   . CHOLECYSTECTOMY  2026  . COLONOSCOPY    . COLONOSCOPY W/ POLYPECTOMY    . EYE SURGERY Bilateral 1958, 2004   cataracts  . INGUINAL HERNIA REPAIR    . JOINT REPLACEMENT  2002  . TOTAL KNEE ARTHROPLASTY Right 2002  . ulnar intrapment release  07/2008  . VARICOSE VEIN SURGERY Left 1980's per patient   Dr. Brody    There were no vitals filed for this visit.  Subjective Assessment - 11/13/17 0904    Subjective  Patient arrived with some increased soreness after working under the car changing oil    Pertinent History  Chronic leg pain, right total knee replacement with persistent pain.    Limitations  Walking    How long can you walk comfortably?  Short community distances.    Patient Stated Goals  Reduce pain.    Currently in Pain?  Yes    Pain Score  3     Pain Location  Back    Pain Orientation  Right;Left;Mid    Pain Descriptors / Indicators   Tender    Pain Type  Chronic pain    Pain Onset  More than a month ago    Pain Frequency  Occasional    Aggravating Factors   certain movements/ increased ADL's    Pain Relieving Factors  at rest                       [facility] Adult PT Treatment/Exercise - 11/13/17 0001      Exercises   Exercises  Lumbar      Lumbar Exercises: Stretches   Lower Trunk Rotation  30 seconds;2 reps      Lumbar Exercises: Aerobic   UBE (Upper Arm Bike)  120 RPM x8 minutes (4 fwd, 4 bwd)   standing for core focus   Nustep  L5 x10min core focus      Lumbar Exercises: Standing   Heel Raises  20 reps    Scapular Retraction  Strengthening;Both;20 reps;Theraband;Limitations;10 reps    Theraband Level (Scapular Retraction)  Other (comment)    Scapular Retraction Limitations  pink XTS    Shoulder Extension  Strengthening;Both;20 reps;Theraband;Limitations;Other ( **Note De-Identified Counce Obfuscation** comment);10 reps    Theraband Level (Shoulder Extension)  Other (comment)    Shoulder Extension Limitations  pink XTS    Other Standing Lumbar Exercises  4# step outs and D1/D2 2x10 each core focus      Lumbar Exercises: Supine   Bridge with clamshell  20 reps;3 seconds   red t-band     Moist Heat Therapy   Number Minutes Moist Heat  15 Minutes    Moist Heat Location  Lumbar Spine      Electrical Stimulation   Electrical Stimulation Location  mid and low back    Electrical Stimulation Action  IFC    Electrical Stimulation Parameters  80-150hz x64mn    Electrical Stimulation Goals  Pain               PT Short Term Goals - 10/11/17 1344      PT SHORT TERM GOAL #1   Title  STG's=LTG's.        PT Long Term Goals - 11/09/17 1347      PT LONG TERM GOAL #1   Title  Independent with a HEP.    Period  Weeks    Status  On-going      PT LONG TERM GOAL #2   Title  Walk a community distance wiht pain not > 4/10.    Baseline  Met 11/09/17    Time  6    Period  Weeks    Status  Achieved      PT LONG TERM GOAL #3    Title  Eliminate radiation of pain into bilateral hips.    Time  6    Period  Weeks    Status  On-going   less overall symptoms 11/09/17     PT LONG TERM GOAL #4   Title  Perform ADL's with pain not > 3/10.    Time  6    Period  Weeks    Status  On-going      PT LONG TERM GOAL #5   Title  Sleep undisturbed 6 hours.    Time  6    Period  Weeks    Status  On-going   improvement yet ongoing 11/09/17           Plan - 11/13/17 0932    Clinical Impression Statement  Patient tolerated treatment well today. Patient able to progress with exercises today. Patient progressing overall and feels like therapy has helped. Patient going to DC next treatment to HEP. Patient has minor pain limitations overall.    Rehab Potential  Good    PT Frequency  2x / week    PT Duration  6 weeks    PT Treatment/Interventions  ADLs/Self Care Home Management;Cryotherapy;Electrical Stimulation;Ultrasound;Moist Heat;Traction;Therapeutic activities;Therapeutic exercise;Patient/family education;Manual techniques;Dry needling    PT Next Visit Plan  Continue with lumbar stabilization and flexibiltiy modalities PRN    Consulted and Agree with Plan of Care  Patient       Patient will benefit from skilled therapeutic intervention in order to improve the following deficits and impairments:  Decreased activity tolerance, Pain, Postural dysfunction, Impaired tone  Visit Diagnosis: Chronic bilateral low back pain, with sciatica presence unspecified  Abnormal posture     Problem List Patient Active Problem List   Diagnosis Date Noted  . Pre-diabetes 10/26/2016  . Hypertriglyceridemia 10/26/2016  . Contact dermatitis 06/27/2016  . Anxiety state 03/07/2016  . Chronic pain syndrome 07/23/2015  . Obesity (BMI 30-39.9) 08/30/2013  . History of **Note De-Identified Class Obfuscation** colon polyps 02/17/2010  . ACTINIC KERATOSIS 08/21/2009  . BACK PAIN, CHRONIC 02/18/2009  . ERECTILE DYSFUNCTION 08/04/2008  . ULNAR NEUROPATHY, LEFT 06/03/2008  .  BPH (benign prostatic hyperplasia) 06/03/2008  . ELEVATED PROSTATE SPECIFIC ANTIGEN 03/03/2008  . Gout 08/03/2007  . ALLERGIC RHINITIS 07/06/2007  . UNS ADVRS EFF UNS RX MEDICINAL&BIOLOGICAL SBSTNC 06/27/2007  . HYDROCELE, RIGHT 04/09/2007  . Essential hypertension 03/02/2007  . VARICOSE VEINS LOWER EXTREMITIES W/INFLAMMATION 03/02/2007  . HLD (hyperlipidemia) 12/04/2006  . RENAL INSUFFICIENCY 12/04/2006    Lelia Jons P, PTA 11/13/2017, 10:00 AM  [facility] [place], [place], [address] Phone: [phone]   Fax:  [phone]  Name: Daniel Brock MRN: [id number] Date of Birth: September 28, 1945

## 2017-11-15 ENCOUNTER — Ambulatory Visit: Payer: BLUE CROSS/BLUE SHIELD | Admitting: Physical Therapy

## 2017-11-15 ENCOUNTER — Encounter: Payer: Self-pay | Admitting: Physical Therapy

## 2017-11-15 DIAGNOSIS — G8929 Other chronic pain: Secondary | ICD-10-CM

## 2017-11-15 DIAGNOSIS — R293 Abnormal posture: Secondary | ICD-10-CM

## 2017-11-15 DIAGNOSIS — M545 Low back pain: Principal | ICD-10-CM

## 2017-11-15 NOTE — Therapy (Addendum)
**Note De-Identified Rooks Obfuscation** [facility] [place], [place], [address] Phone: [phone]   Fax:  [phone]  Physical Therapy Treatment  Patient Details  Name: Tiandre Teall Rozelle MRN: [id number] Date of Birth: November 04, 1945 Referring Provider: Kenn File MD.   Encounter Date: 11/15/2017  PT End of Session - 11/15/17 1358    Visit Number  11    Number of Visits  12    Date for PT Re-Evaluation  11/22/17    PT Start Time  1350    PT Stop Time  1440    PT Time Calculation (min)  50 min    Activity Tolerance  Patient tolerated treatment well    Behavior During Therapy  [facility] for tasks assessed/performed       Past Medical History:  Diagnosis Date  . Allergy   . Anxiety   . Cataract   . Chronic foot pain   . Chronic kidney disease   . Chronic leg pain   . Depression 2016  . Gout   . Hiatal hernia   . Hyperlipidemia   . Hypertension   . Renal insufficiency   . Varicose veins     Past Surgical History:  Procedure Laterality Date  . CARPAL TUNNEL RELEASE Left   . CARPAL TUNNEL RELEASE Bilateral   . CHOLECYSTECTOMY  2026  . COLONOSCOPY    . COLONOSCOPY W/ POLYPECTOMY    . EYE SURGERY Bilateral 1958, 2004   cataracts  . INGUINAL HERNIA REPAIR    . JOINT REPLACEMENT  2002  . TOTAL KNEE ARTHROPLASTY Right 2002  . ulnar intrapment release  07/2008  . VARICOSE VEIN SURGERY Left 1980's per patient   Dr. Maurene Capes    There were no vitals filed for this visit.  Subjective Assessment - 11/15/17 1356    Subjective  Reports that his back is okay today in his low back but his mid back hurts too.    Pertinent History  Chronic leg pain, right total knee replacement with persistent pain.    Limitations  Walking    How long can you walk comfortably?  Short community distances.    Patient Stated Goals  Reduce pain.    Currently in Pain?  Other (Comment)   Reported mid back pain but no pain ratings        [facility] PT Assessment - 11/15/17 0001       Assessment   Medical Diagnosis  Low back pain.                   [facility] PT Treatment/Exercise - 11/15/17 0001      Exercises   Exercises  Lumbar      Lumbar Exercises: Aerobic   UBE (Upper Arm Bike)  60 RPM x6 min    Nustep  L5 x71mn core focus      Lumbar Exercises: Standing   Scapular Retraction  Strengthening;Both;20 reps;Theraband;Limitations;10 reps    Theraband Level (Scapular Retraction)  Other (comment)    Scapular Retraction Limitations  pink XTS    Shoulder Extension  Strengthening;Both;20 reps;Theraband;Limitations;Other (comment);10 reps    Theraband Level (Shoulder Extension)  Other (comment)    Shoulder Extension Limitations  Pink XTS    Other Standing Lumbar Exercises  B chop wood pink XTS x20 reps      Lumbar Exercises: Supine   Bridge  20 reps      Modalities   Modalities  Electrical Stimulation;Moist Heat      Moist Heat Therapy **Note De-Identified Daugherty Obfuscation** Number Minutes Moist Heat  15 Minutes    Moist Heat Location  Lumbar Spine      Electrical Stimulation   Electrical Stimulation Location  mid and low back    Electrical Stimulation Action  IFC    Electrical Stimulation Parameters  80-150 hz x15 min    Electrical Stimulation Goals  Pain               PT Short Term Goals - 10/11/17 1344      PT SHORT TERM GOAL #1   Title  STG's=LTG's.        PT Long Term Goals - 11/15/17 1420      PT LONG TERM GOAL #1   Title  Independent with a HEP.    Period  Weeks    Status  Partially Met      PT LONG TERM GOAL #2   Title  Walk a community distance wiht pain not > 4/10.    Baseline  Met 11/09/17    Time  6    Period  Weeks    Status  Achieved      PT LONG TERM GOAL #3   Title  Eliminate radiation of pain into bilateral hips.    Time  6    Period  Weeks    Status  Partially Met   Radiation is less severe in low back per patient report 11/15/2017     PT LONG TERM GOAL #4   Title  Perform ADL's with pain not > 3/10.    Time  6    Period  Weeks     Status  Achieved   Per patient report 11/15/2017     PT LONG TERM GOAL #5   Title  Sleep undisturbed 6 hours.    Time  6    Period  Weeks    Status  Achieved            Plan - 11/15/17 1449    Clinical Impression Statement  Patient tolerated today's treatment well as he has met partially the LTGs from evaluation. No complaints of LBP upon arrival but as the exercises progressed that pain was minmal at 1/10. Patient continues to experince pain and radiating pain although less severe per patient reports. Normal modalities response noted following removal of the modalities.    Rehab Potential  Good    PT Frequency  2x / week    PT Duration  6 weeks    PT Treatment/Interventions  ADLs/Self Care Home Management;Cryotherapy;Electrical Stimulation;Ultrasound;Moist Heat;Traction;Therapeutic activities;Therapeutic exercise;Patient/family education;Manual techniques;Dry needling    PT Next Visit Plan  D/C summary required.    Consulted and Agree with Plan of Care  Patient       Patient will benefit from skilled therapeutic intervention in order to improve the following deficits and impairments:  Decreased activity tolerance, Pain, Postural dysfunction, Impaired tone  Visit Diagnosis: Chronic bilateral low back pain, with sciatica presence unspecified  Abnormal posture     Problem List Patient Active Problem List   Diagnosis Date Noted  . Pre-diabetes 10/26/2016  . Hypertriglyceridemia 10/26/2016  . Contact dermatitis 06/27/2016  . Anxiety state 03/07/2016  . Chronic pain syndrome 07/23/2015  . Obesity (BMI 30-39.9) 08/30/2013  . History of colon polyps 02/17/2010  . ACTINIC KERATOSIS 08/21/2009  . BACK PAIN, CHRONIC 02/18/2009  . ERECTILE DYSFUNCTION 08/04/2008  . ULNAR NEUROPATHY, LEFT 06/03/2008  . BPH (benign prostatic hyperplasia) 06/03/2008  . ELEVATED PROSTATE SPECIFIC ANTIGEN 03/03/2008  . Gout 08/03/2007  . **Note De-Identified Glotfelty Obfuscation** ALLERGIC RHINITIS 07/06/2007  . UNS ADVRS EFF UNS RX  MEDICINAL&BIOLOGICAL SBSTNC 06/27/2007  . HYDROCELE, RIGHT 04/09/2007  . Essential hypertension 03/02/2007  . VARICOSE VEINS LOWER EXTREMITIES W/INFLAMMATION 03/02/2007  . HLD (hyperlipidemia) 12/04/2006  . RENAL INSUFFICIENCY 12/04/2006    Standley Brooking, PTA 11/15/17 3:04 PM   [facility] [address] [place], [place], [address] Phone: [phone]   Fax:  [phone]  Name: Desmen Schoffstall Aina MRN: [id number] Date of Birth: 08-05-1945  PHYSICAL THERAPY DISCHARGE SUMMARY  Visits from Start of Care: 11.  Current functional level related to goals / functional outcomes: See above.   Remaining deficits: See goal section above.   Education / Equipment: HEP. Plan: Patient agrees to discharge.  Patient goals were partially met. Patient is being discharged due to being pleased with the current functional level.  ?????         Mali Applegate MPT

## 2017-12-04 ENCOUNTER — Telehealth: Payer: Self-pay | Admitting: Cardiovascular Disease

## 2017-12-04 ENCOUNTER — Ambulatory Visit: Payer: BLUE CROSS/BLUE SHIELD | Admitting: Cardiovascular Disease

## 2017-12-04 ENCOUNTER — Encounter: Payer: Self-pay | Admitting: Cardiovascular Disease

## 2017-12-04 ENCOUNTER — Encounter: Payer: Self-pay | Admitting: *Deleted

## 2017-12-04 VITALS — BP 122/83 | HR 74 | Ht 70.0 in | Wt 252.6 lb

## 2017-12-04 DIAGNOSIS — R0609 Other forms of dyspnea: Secondary | ICD-10-CM | POA: Diagnosis not present

## 2017-12-04 DIAGNOSIS — G4733 Obstructive sleep apnea (adult) (pediatric): Secondary | ICD-10-CM

## 2017-12-04 DIAGNOSIS — E785 Hyperlipidemia, unspecified: Secondary | ICD-10-CM | POA: Diagnosis not present

## 2017-12-04 DIAGNOSIS — I10 Essential (primary) hypertension: Secondary | ICD-10-CM | POA: Diagnosis not present

## 2017-12-04 DIAGNOSIS — R0600 Dyspnea, unspecified: Secondary | ICD-10-CM

## 2017-12-04 NOTE — Patient Instructions (Addendum)
**Note De-Identified Igo Obfuscation** Medication Instructions:  Continue all current medications.  Labwork: none  Testing/Procedures:  Your physician has requested that you have an echocardiogram. Echocardiography is a painless test that uses sound waves to create images of your heart. It provides your doctor with information about the size and shape of your heart and how well your heart's chambers and valves are working. This procedure takes approximately one hour. There are no restrictions for this procedure.  Your physician has requested that you have a lexiscan myoview. For further information please visit [email or web address]. Please follow instruction sheet, as given.  Office will contact with results [place] phone or letter.    Follow-Up: 3 months   Any Other Special Instructions Will Be Listed Below (If Applicable). You have been referred to:  [facility]   If you need a refill on your cardiac medications before your next appointment, please call your pharmacy.

## 2017-12-04 NOTE — Progress Notes (Signed)
**Note De-Identified Turgeon Obfuscation** CARDIOLOGY CONSULT NOTE  Patient ID: Daniel Brock MRN: [id number] DOB/AGE: 10-04-1945 72 y.o.  Admit date: (Not on file) Primary Physician: Timmothy Euler, MD Referring Physician: Dr. Kenn File  Reason for Consultation: Exertional dyspnea  HPI: Daniel Brock is a 72 y.o. male who is being seen today for the evaluation of exertional dyspnea at the request of Timmothy Euler, MD.   Past medical history includes hyperlipidemia and hypertension.  I reviewed notes by his PCP.  On 10/02/2017, he reported exertional dyspnea which have gotten somewhat worse over the previous few months.  ECG performed in the office today which I ordered and personally interpreted demonstrates normal sinus rhythm with left anterior fascicular block, nonspecific intraventricular conduction delay, and late R wave transition.  He is here with his wife.  He has been experiencing progressive exertional dyspnea over the past 1 to 1-1/2 years.  If he stands up too quickly he has some blurriness of vision.  He denies exertional chest pain.  He does have some GERD symptoms.  He is undergone several surgeries on his right knee including total knee replacement surgery and continues to have issues with knee pain and does not get much by way of physical activity.  He does not smoke.  He denies coughing and wheezing.  He does have some chronic back pain and leg pain with leg cramps.  He has been nauseous but denies vomiting.  He also admits to waking up at night with a suffocating feeling and thinks he has sleep apnea although he has never been formally diagnosed.  Family history: Mother underwent angioplasty in her late 50s/early 9s.  Maternal grandfather died of an MI at age 42.    Allergies  Allergen Reactions  . Ciprofloxacin Nausea And Vomiting  . Ciprofloxacin Hcl     REACTION: NAUSIA AND VOMITING  . Sulfamethoxazole-Trimethoprim     REACTION: nausea and vomiting  . Sulfonamide Derivatives    REACTION: itching  . Sulphur [Sulfur] Rash    Current Outpatient Medications  Medication Sig Dispense Refill  . allopurinol (ZYLOPRIM) 100 MG tablet TAKE ONE TABLET BY MOUTH TWICE DAILY 180 tablet 0  . colchicine 0.6 MG tablet Take 1 tablet (0.6 mg total) by mouth daily. May use up to 2 a day on the initial day (Patient taking differently: Take 0.6 mg by mouth as needed. May use up to 2 a day on the initial day) 15 tablet 1  . cyclobenzaprine (FLEXERIL) 10 MG tablet Take 10 mg 3 (three) times daily as needed by mouth for muscle spasms.    . Diclofenac Sodium (PENNSAID) 2 % SOLN Place 1 application onto the skin daily as needed. Applies to knees; pt think it's 2 %, uses prn 112 g 3  . ezetimibe (ZETIA) 10 MG tablet Take 1 tablet (10 mg total) by mouth daily. 90 tablet 3  . OVER THE COUNTER MEDICATION Eye vitamin    . sertraline (ZOLOFT) 100 MG tablet Take 0.5 tablets (50 mg total) by mouth daily. 90 tablet 1  . tamsulosin (FLOMAX) 0.4 MG CAPS capsule Take 2 capsules (0.8 mg total) by mouth daily. 180 capsule 3  . terbinafine (LAMISIL) 250 MG tablet Take 1 tablet (250 mg total) daily by mouth. 30 tablet 2  . carvedilol (COREG) 6.25 MG tablet Take 1 tablet (6.25 mg total) by mouth 2 (two) times daily with a meal. 60 tablet 3   No current facility-administered medications for this visit. **Note De-Identified [place]ott Obfuscation** Past Medical History:  Diagnosis Date  . Allergy   . Anxiety   . Cataract   . Chronic foot pain   . Chronic kidney disease   . Chronic leg pain   . Depression 2016  . Gout   . Hiatal hernia   . Hyperlipidemia   . Hypertension   . Renal insufficiency   . Varicose veins     Past Surgical History:  Procedure Laterality Date  . CARPAL TUNNEL RELEASE Left   . CARPAL TUNNEL RELEASE Bilateral   . CHOLECYSTECTOMY  2026  . COLONOSCOPY    . COLONOSCOPY W/ POLYPECTOMY    . EYE SURGERY Bilateral 1958, 2004   cataracts  . INGUINAL HERNIA REPAIR    . JOINT REPLACEMENT  2002  . TOTAL KNEE  ARTHROPLASTY Right 2002  . ulnar intrapment release  07/2008  . VARICOSE VEIN SURGERY Left 1980's per patient   Dr. Maurene Capes    Social History   Socioeconomic History  . Marital status: Married    Spouse name: Not on file  . Number of children: Not on file  . Years of education: Not on file  . Highest education level: Not on file  Occupational History  . Not on file  Social Needs  . Financial resource strain: Not on file  . Food insecurity:    Worry: Not on file    Inability: Not on file  . Transportation needs:    Medical: Not on file    Non-medical: Not on file  Tobacco Use  . Smoking status: Never Smoker  . Smokeless tobacco: Never Used  Substance and Sexual Activity  . Alcohol use: No  . Drug use: No  . Sexual activity: Never  Lifestyle  . Physical activity:    Days per week: Not on file    Minutes per session: Not on file  . Stress: Not on file  Relationships  . Social connections:    Talks on phone: Not on file    Gets together: Not on file    Attends religious service: Not on file    Active member of club or organization: Not on file    Attends meetings of clubs or organizations: Not on file    Relationship status: Not on file  . Intimate partner violence:    Fear of current or ex partner: Not on file    Emotionally abused: Not on file    Physically abused: Not on file    Forced sexual activity: Not on file  Other Topics Concern  . Not on file  Social History Narrative  . Not on file      Current Meds  Medication Sig  . allopurinol (ZYLOPRIM) 100 MG tablet TAKE ONE TABLET BY MOUTH TWICE DAILY  . colchicine 0.6 MG tablet Take 1 tablet (0.6 mg total) by mouth daily. May use up to 2 a day on the initial day (Patient taking differently: Take 0.6 mg by mouth as needed. May use up to 2 a day on the initial day)  . cyclobenzaprine (FLEXERIL) 10 MG tablet Take 10 mg 3 (three) times daily as needed by mouth for muscle spasms.  . Diclofenac Sodium (PENNSAID) 2 %  SOLN Place 1 application onto the skin daily as needed. Applies to knees; pt think it's 2 %, uses prn  . ezetimibe (ZETIA) 10 MG tablet Take 1 tablet (10 mg total) by mouth daily.  Marland Kitchen OVER THE COUNTER MEDICATION Eye vitamin  . sertraline (ZOLOFT) 100 MG tablet **Note De-Identified Pollman Obfuscation** Take 0.5 tablets (50 mg total) by mouth daily.  . tamsulosin (FLOMAX) 0.4 MG CAPS capsule Take 2 capsules (0.8 mg total) by mouth daily.  Marland Kitchen terbinafine (LAMISIL) 250 MG tablet Take 1 tablet (250 mg total) daily by mouth.      Review of systems complete and found to be negative unless listed above in HPI    Physical exam Blood pressure 122/83, pulse 74, height 5\' 10"  (1.778 m), weight 252 lb 9.6 oz (114.6 kg), SpO2 98 %. General: NAD Neck: No JVD, no thyromegaly or thyroid nodule.  Lungs: Clear to auscultation bilaterally with normal respiratory effort. CV: Nondisplaced PMI. Regular rate and rhythm, normal S1/S2, no S3/S4, no murmur.  No peripheral edema.  No carotid bruit.    Abdomen: Soft, nontender, no distention, protuberant.  Skin: Intact without lesions or rashes.  Neurologic: Alert and oriented x 3.  Psych: Normal affect. Extremities: No clubbing or cyanosis.  HEENT: Normal.   ECG: Most recent ECG reviewed.   Labs: Lab Results  Component Value Date/Time   K 4.1 10/02/2017 10:03 AM   BUN 20 10/02/2017 10:03 AM   CREATININE 1.29 (H) 10/02/2017 10:03 AM   ALT 16 10/02/2017 10:03 AM   TSH 5.28 03/01/2013 11:39 AM   HGB 14.5 07/27/2017 10:16 AM     Lipids: Lab Results  Component Value Date/Time   LDLCALC 108 (H) 10/02/2017 10:03 AM   LDLDIRECT 108 (H) 06/27/2016 11:17 AM   LDLDIRECT 160.9 03/01/2013 11:39 AM   CHOL 205 (H) 10/02/2017 10:03 AM   TRIG 294 (H) 10/02/2017 10:03 AM   HDL 38 (L) 10/02/2017 10:03 AM        ASSESSMENT AND PLAN:  1.  Exertional dyspnea: ECG is abnormal.  Cardiac risk factors include hypertension and hyperlipidemia.  He has significant right knee pain. I will proceed with a nuclear  myocardial perfusion imaging study to evaluate for ischemic heart disease (Lexiscan Myoview). I will order a 2-D echocardiogram with Doppler to evaluate cardiac structure, function, and regional wall motion.  2.  Hypertension: Blood pressure is normal.  He said he is neither on lisinopril nor carvedilol.  This will need further monitoring to see if antihypertensive therapy is indeed indicated.  3.  Hyperlipidemia: Currently on Zetia.  I reviewed the lipid panel from 10/02/2017 which showed total cholesterol 205, triglycerides 294, HDL 38, LDL 108.  4.  Obstructive sleep apnea: He needs a sleep study.  I informed him that sleep apnea is an independent risk factor for arrhythmia, MI, and stroke.  I will make a referral.   Disposition: Follow up in 3 months   Signed: Kate Sable, M.D., F.A.C.C.  12/04/2017, 1:47 PM

## 2017-12-04 NOTE — Telephone Encounter (Signed)
**Note De-Identified Sissel Obfuscation** Pre-cert Verification for the following procedure   Lexiscan scheduled for 12-13-2017  At [facility]

## 2017-12-13 ENCOUNTER — Ambulatory Visit (HOSPITAL_COMMUNITY)
Admission: RE | Admit: 2017-12-13 | Discharge: 2017-12-13 | Disposition: A | Discharge status: Home / Self Care | Payer: BLUE CROSS/BLUE SHIELD | Source: Ambulatory Visit | Attending: Cardiovascular Disease | Admitting: Cardiovascular Disease

## 2017-12-13 ENCOUNTER — Encounter (HOSPITAL_COMMUNITY)
Admission: RE | Admit: 2017-12-13 | Discharge: 2017-12-13 | Disposition: A | Discharge status: Home / Self Care | Payer: BLUE CROSS/BLUE SHIELD | Source: Ambulatory Visit | Attending: Cardiovascular Disease | Admitting: Cardiovascular Disease

## 2017-12-13 ENCOUNTER — Encounter (HOSPITAL_COMMUNITY): Payer: Self-pay

## 2017-12-13 DIAGNOSIS — R0609 Other forms of dyspnea: Secondary | ICD-10-CM | POA: Insufficient documentation

## 2017-12-13 DIAGNOSIS — R9439 Abnormal result of other cardiovascular function study: Secondary | ICD-10-CM | POA: Diagnosis not present

## 2017-12-13 DIAGNOSIS — R0600 Dyspnea, unspecified: Secondary | ICD-10-CM

## 2017-12-13 LAB — NM MYOCAR MULTI W/SPECT W/WALL MOTION / EF
CHL CUP NUCLEAR SDS: 5
CHL CUP NUCLEAR SRS: 10
CHL CUP NUCLEAR SSS: 15
CHL CUP RESTING HR STRESS: 64 {beats}/min
CSEPPHR: 86 {beats}/min
LV dias vol: 93 mL (ref 62–150)
LV sys vol: 46 mL
RATE: 0.44
TID: 1.02

## 2017-12-13 MED ORDER — TECHNETIUM TC 99M TETROFOSMIN IV KIT: 30.0000 | PACK | Freq: Once | INTRAVENOUS | Status: DC | PRN

## 2017-12-13 MED ORDER — TECHNETIUM TC 99M TETROFOSMIN IV KIT
30.0000 | PACK | Freq: Once | INTRAVENOUS | Status: AC | PRN
  Administered 2017-12-13: 30 via INTRAVENOUS

## 2017-12-13 MED ORDER — TECHNETIUM TC 99M TETROFOSMIN IV KIT
10.0000 | PACK | Freq: Once | INTRAVENOUS | Status: AC | PRN
  Administered 2017-12-13: 10.09 via INTRAVENOUS

## 2017-12-13 MED ORDER — REGADENOSON 0.4 MG/5ML IV SOLN
INTRAVENOUS | Status: AC
  Administered 2017-12-13: 0.4 mg via INTRAVENOUS
  Filled 2017-12-13: qty 5

## 2017-12-13 MED ORDER — SODIUM CHLORIDE 0.9% FLUSH
INTRAVENOUS | Status: AC
  Administered 2017-12-13: 10 mL via INTRAVENOUS
  Filled 2017-12-13: qty 10

## 2017-12-14 ENCOUNTER — Other Ambulatory Visit: Payer: BLUE CROSS/BLUE SHIELD

## 2017-12-18 ENCOUNTER — Telehealth: Payer: Self-pay | Admitting: *Deleted

## 2017-12-18 MED ORDER — ASPIRIN EC 81 MG PO TBEC: 81.0000 mg | DELAYED_RELEASE_TABLET | Freq: Every day | ORAL | Status: DC

## 2017-12-18 MED ORDER — ROSUVASTATIN CALCIUM 20 MG PO TABS: 20.0000 mg | ORAL_TABLET | Freq: Every day | ORAL | 6 refills | Status: DC

## 2017-12-18 NOTE — Telephone Encounter (Signed)
**Note De-Identified Jurney Obfuscation** Notes recorded by Laurine Blazer, LPN on 3/97/6734 at 1:93 PM EDT Patient notified. Copy to pmd. Will stop Zetia & begin Rosuvastatin 20mg  daily. His Echo is scheduled for 12/21/2017. ------  Notes recorded by Herminio Commons, MD on 12/13/2017 at 11:08 AM EDT There are abnormalities primarily along the bottom wall of the heart. There is certainly shadowing due to abdominal wall tissue. However, some degree of a prior heart attack cannot entirely be ruled out. I will await the echocardiogram results. Start aspirin 81 mg and rosuvastatin 20 mg for the time being. He can stop Zetia.

## 2017-12-21 ENCOUNTER — Ambulatory Visit (INDEPENDENT_AMBULATORY_CARE_PROVIDER_SITE_OTHER): Payer: BLUE CROSS/BLUE SHIELD

## 2017-12-21 ENCOUNTER — Other Ambulatory Visit: Payer: Self-pay

## 2017-12-21 DIAGNOSIS — R0609 Other forms of dyspnea: Secondary | ICD-10-CM

## 2017-12-21 DIAGNOSIS — R0600 Dyspnea, unspecified: Secondary | ICD-10-CM

## 2017-12-25 ENCOUNTER — Telehealth: Payer: Self-pay | Admitting: Cardiovascular Disease

## 2017-12-25 NOTE — Telephone Encounter (Signed)
**Note De-Identified [place] Obfuscation** Notes recorded by Laurine Blazer, LPN on 28/04/811 at 8:87 AM EDT Patient notified. Copy to pmd. Follow up scheduled for December with Dr. Bronson Ing.   ------  Notes recorded by Laurine Blazer, LPN on 19/07/9745 at 1:85 PM EDT Left message to return call.  ------  Notes recorded by Herminio Commons, MD on 12/22/2017 at 9:35 AM EDT Normal pumping function.

## 2017-12-25 NOTE — Telephone Encounter (Signed)
**Note De-identified Schmit Obfuscation** Returning call from Friday. °

## 2018-01-16 ENCOUNTER — Telehealth: Payer: Self-pay | Admitting: Cardiovascular Disease

## 2018-01-16 NOTE — Telephone Encounter (Signed)
**Note De-Identified [name]** Pt says for the last 2 weeks has increased SOB/aching/swelling - thinks symptoms are related to Crestor and stopped taking 2 days ago and says the SOB is somewhat better but still cannot lay flat on his back and breathe at the same time - says his leg feel swollen and heavy - denies any dizziness

## 2018-01-16 NOTE — Telephone Encounter (Signed)
**Note De-Identified Lozon Obfuscation** rosuvastatin (CRESTOR) 20 MG tablet     Rash, SOB Itchy  swelling ankles having hard time walking  Call either number if you can't reach him

## 2018-01-17 NOTE — Telephone Encounter (Signed)
**Note De-Identified Bodine Obfuscation** Has weight changed? Pumping function of heart was normal based on echo. Continue to stay off Crestor. Can try Lasix 20 mg daily x 3 days with KCl 20 meq daily x 3 days. Have him call back after 3 days to see if symptoms have improved.

## 2018-01-17 NOTE — Telephone Encounter (Signed)
**Note De-Identified Riel Obfuscation** Will forward to Dr Raliegh Ip since he is back in the office.   Zandra Abts MD

## 2018-01-18 NOTE — Telephone Encounter (Signed)
**Note De-identified Pelzer Obfuscation** Left message to return call 

## 2018-01-19 ENCOUNTER — Encounter: Payer: Self-pay | Admitting: Family Medicine

## 2018-01-19 ENCOUNTER — Ambulatory Visit (INDEPENDENT_AMBULATORY_CARE_PROVIDER_SITE_OTHER): Payer: BLUE CROSS/BLUE SHIELD

## 2018-01-19 ENCOUNTER — Ambulatory Visit: Payer: BLUE CROSS/BLUE SHIELD | Admitting: Family Medicine

## 2018-01-19 VITALS — BP 155/90 | HR 74 | Temp 97.4°F | Ht 70.0 in | Wt 264.6 lb

## 2018-01-19 DIAGNOSIS — R0601 Orthopnea: Secondary | ICD-10-CM

## 2018-01-19 DIAGNOSIS — R0602 Shortness of breath: Secondary | ICD-10-CM | POA: Diagnosis not present

## 2018-01-19 DIAGNOSIS — I10 Essential (primary) hypertension: Secondary | ICD-10-CM

## 2018-01-19 DIAGNOSIS — R609 Edema, unspecified: Secondary | ICD-10-CM

## 2018-01-19 MED ORDER — LOSARTAN POTASSIUM 50 MG PO TABS: 50.0000 mg | ORAL_TABLET | Freq: Every day | ORAL | 3 refills | Status: DC

## 2018-01-19 NOTE — Progress Notes (Signed)
**Note De-Identified [place]auf Obfuscation** BP (!) 155/90   Pulse 74   Temp (!) 97.4 F (36.3 C) (Oral)   Ht _0  (1.778 m)   Wt 264 lb 9.6 oz (120 kg)   SpO2 95%   BMI 37.97 kg/m    Subjective:    Patient ID: Daniel Brock, male    DOB: [birth year], 72 y.o.   MRN: [id number]  HPI: Daniel Brock is a 72 y.o. male presenting on 01/19/2018 for Foot Swelling (bilateral - x 1 week); Joint Swelling (bilateral wrist swelling - x 1 week ); and Shortness of Breath (on and off but states that he was having a lot of trouble this weekend breathing.)   HPI Bilateral lower extremity swelling and shortness of breath and difficulty sleeping with laying flat Patient has been having increased bilateral foot swelling and swelling in hands and wrists that is been going on for about the past week.  He is also been complaining of increased shortness of breath especially when he lays flat that is been on and off and worsening over this weekend.  He denies any chest pain or palpitations but is coming in because of this increased swelling and breathing issues.  He denies any cough or wheezing or fevers or chills.  He also admits that he has a sleep apnea study in the near future.  Patient has records of an echocardiogram on 12/21/2017 showed an ejection fraction 50 to 55% and indeterminate diastolic function with  Hypertension Patient is currently on carvedilol and losartan, and their blood pressure today is 155/90. Patient denies any lightheadedness or dizziness. Patient denies headaches, blurred vision, chest pains, shortness of breath, or weakness. Denies any side effects from medication and is content with current medication.   Relevant past medical, surgical, family and social history reviewed and updated as indicated. Interim medical history since our last visit reviewed. Allergies and medications reviewed and updated.  Review of Systems  Constitutional: Negative for chills and fever.  HENT: Negative for congestion.   Eyes: Negative for visual  disturbance.  Respiratory: Positive for shortness of breath. Negative for cough and wheezing.   Cardiovascular: Positive for palpitations and leg swelling. Negative for chest pain.  Musculoskeletal: Negative for back pain and gait problem.  Skin: Negative for rash.  All other systems reviewed and are negative.   Per HPI unless specifically indicated above   Allergies as of 01/19/2018      Reactions   Ciprofloxacin Nausea And Vomiting   Ciprofloxacin Hcl    REACTION: NAUSIA AND VOMITING   Sulfamethoxazole-trimethoprim    REACTION: nausea and vomiting   Sulfonamide Derivatives    REACTION: itching   Sulphur [sulfur] Rash      Medication List        Accurate as of 01/19/18 11:59 PM. Always use your most recent med list.          allopurinol 100 MG tablet Commonly known as:  ZYLOPRIM TAKE ONE TABLET BY MOUTH TWICE DAILY   aspirin EC 81 MG tablet Take 1 tablet (81 mg total) by mouth daily.   carvedilol 6.25 MG tablet Commonly known as:  COREG Take 1 tablet (6.25 mg total) by mouth 2 (two) times daily with a meal.   colchicine 0.6 MG tablet Take 1 tablet (0.6 mg total) by mouth daily. May use up to 2 a day on the initial day   cyclobenzaprine 10 MG tablet Commonly known as:  FLEXERIL Take 10 mg 3 (three) times daily as needed by **Note De-Identified Arechiga Obfuscation** mouth for muscle spasms.   Diclofenac Sodium 2 % Soln Place 1 application onto the skin daily as needed. Applies to knees; pt think it's 2 %, uses prn   losartan 50 MG tablet Commonly known as:  COZAAR Take 1 tablet (50 mg total) by mouth daily.   OVER THE COUNTER MEDICATION Eye vitamin   sertraline 100 MG tablet Commonly known as:  ZOLOFT Take 0.5 tablets (50 mg total) by mouth daily.   tamsulosin 0.4 MG Caps capsule Commonly known as:  FLOMAX Take 2 capsules (0.8 mg total) by mouth daily.          Objective:    BP (!) 155/90   Pulse 74   Temp (!) 97.4 F (36.3 C) (Oral)   Ht _0  (1.778 m)   Wt 264 lb 9.6 oz (120 kg)    SpO2 95%   BMI 37.97 kg/m   Wt Readings from Last 3 Encounters:  01/19/18 264 lb 9.6 oz (120 kg)  12/04/17 252 lb 9.6 oz (114.6 kg)  10/02/17 256 lb 6.4 oz (116.3 kg)    Physical Exam  Constitutional: He is oriented to person, place, and time. He appears well-developed and well-nourished. No distress.  Eyes: Conjunctivae are normal. No scleral icterus.  Neck: Neck supple. No thyromegaly present.  Cardiovascular: Normal rate, regular rhythm, normal heart sounds and intact distal pulses.  No murmur heard. Pulmonary/Chest: Effort normal and breath sounds normal. No respiratory distress. He has no wheezes. He has no rales.  Musculoskeletal: Normal range of motion. He exhibits edema (1+ bilateral lower extremity edema).  Lymphadenopathy:    He has no cervical adenopathy.  Neurological: He is alert and oriented to person, place, and time. Coordination normal.  Skin: Skin is warm and dry. No rash noted. He is not diaphoretic.  Psychiatric: He has a normal mood and affect. His behavior is normal.  Nursing note and vitals reviewed.   EKG: Heart rate of 71, normal sinus rhythm  Chest x-ray: No signs of acute abnormality or fluid    Assessment & Plan:   Problem List Items Addressed This Visit      Cardiovascular and Mediastinum   Essential hypertension   Relevant Medications   losartan (COZAAR) 50 MG tablet    Other Visit Diagnoses    Peripheral edema    -  Primary   Relevant Orders   DG Chest 2 View (Completed)   EKG 12-Lead (Completed)   CBC with Differential/Platelet (Completed)   CMP14+EGFR (Completed)   Brain natriuretic peptide (Completed)   Orthopnea       Relevant Orders   DG Chest 2 View (Completed)   EKG 12-Lead (Completed)   CBC with Differential/Platelet (Completed)   CMP14+EGFR (Completed)   Brain natriuretic peptide (Completed)   Shortness of breath       Relevant Orders   DG Chest 2 View (Completed)   EKG 12-Lead (Completed)   CBC with  Differential/Platelet (Completed)   CMP14+EGFR (Completed)   Brain natriuretic peptide (Completed)      I believe a lot of his problems may be related to sleep apnea that is undiagnosed, he does have scheduled for a sleep study and is going there soon.  We will start losartan for blood pressure as he has had problems with lisinopril previously. Follow up plan: Return if symptoms worsen or fail to improve.  Counseling provided for all of the vaccine components Orders Placed This Encounter  Procedures  . DG Chest 2 View  . **Note De-Identified Robey Obfuscation** CBC with Differential/Platelet  . CMP14+EGFR  . Brain natriuretic peptide  . EKG 12-Lead    Caryl Pina, MD [facility] 01/24/2018, 10:18 PM

## 2018-01-20 LAB — CBC WITH DIFFERENTIAL/PLATELET
BASOS: 1 %
Basophils Absolute: 0 10*3/uL (ref 0.0–0.2)
EOS (ABSOLUTE): 0.3 10*3/uL (ref 0.0–0.4)
EOS: 6 %
HEMATOCRIT: 42.5 % (ref 37.5–51.0)
HEMOGLOBIN: 13.2 g/dL (ref 13.0–17.7)
IMMATURE GRANS (ABS): 0 10*3/uL (ref 0.0–0.1)
IMMATURE GRANULOCYTES: 0 %
LYMPHS: 24 %
Lymphocytes Absolute: 1.1 10*3/uL (ref 0.7–3.1)
MCH: 27.4 pg (ref 26.6–33.0)
MCHC: 31.1 g/dL — ABNORMAL LOW (ref 31.5–35.7)
MCV: 88 fL (ref 79–97)
Monocytes Absolute: 0.4 10*3/uL (ref 0.1–0.9)
Monocytes: 8 %
Neutrophils Absolute: 2.7 10*3/uL (ref 1.4–7.0)
Neutrophils: 61 %
Platelets: 129 10*3/uL — ABNORMAL LOW (ref 150–450)
RBC: 4.82 x10E6/uL (ref 4.14–5.80)
RDW: 13.7 % (ref 12.3–15.4)
WBC: 4.5 10*3/uL (ref 3.4–10.8)

## 2018-01-20 LAB — CMP14+EGFR
A/G RATIO: 2.2 (ref 1.2–2.2)
ALT: 20 IU/L (ref 0–44)
AST: 13 IU/L (ref 0–40)
Albumin: 4 g/dL (ref 3.5–4.8)
Alkaline Phosphatase: 94 IU/L (ref 39–117)
BILIRUBIN TOTAL: 0.4 mg/dL (ref 0.0–1.2)
BUN/Creatinine Ratio: 13 (ref 10–24)
BUN: 18 mg/dL (ref 8–27)
CALCIUM: 9 mg/dL (ref 8.6–10.2)
CHLORIDE: 106 mmol/L (ref 96–106)
CO2: 26 mmol/L (ref 20–29)
Creatinine, Ser: 1.38 mg/dL — ABNORMAL HIGH (ref 0.76–1.27)
GFR, EST AFRICAN AMERICAN: 58 mL/min/{1.73_m2} — AB (ref >59)
GFR, EST NON AFRICAN AMERICAN: 50 mL/min/{1.73_m2} — AB (ref >59)
GLOBULIN, TOTAL: 1.8 g/dL (ref 1.5–4.5)
Glucose: 105 mg/dL — ABNORMAL HIGH (ref 65–99)
POTASSIUM: 4.2 mmol/L (ref 3.5–5.2)
Sodium: 146 mmol/L — ABNORMAL HIGH (ref 134–144)
TOTAL PROTEIN: 5.8 g/dL — AB (ref 6.0–8.5)

## 2018-01-20 LAB — BRAIN NATRIURETIC PEPTIDE: BNP: 47.8 pg/mL (ref 0.0–100.0)

## 2018-01-22 NOTE — Telephone Encounter (Signed)
**Note De-Identified Bockr[place] Obfuscation** Patient had office visit with pmd on 01/19/2018.

## 2018-01-29 ENCOUNTER — Ambulatory Visit: Payer: BLUE CROSS/BLUE SHIELD | Admitting: Family Medicine

## 2018-02-21 ENCOUNTER — Ambulatory Visit (INDEPENDENT_AMBULATORY_CARE_PROVIDER_SITE_OTHER): Payer: BLUE CROSS/BLUE SHIELD | Admitting: Family Medicine

## 2018-02-21 ENCOUNTER — Encounter: Payer: Self-pay | Admitting: Family Medicine

## 2018-02-21 VITALS — BP 101/59 | HR 78 | Temp 97.8°F | Ht 70.0 in | Wt 251.8 lb

## 2018-02-21 DIAGNOSIS — R945 Abnormal results of liver function studies: Secondary | ICD-10-CM | POA: Diagnosis not present

## 2018-02-21 DIAGNOSIS — R609 Edema, unspecified: Secondary | ICD-10-CM

## 2018-02-21 DIAGNOSIS — R7989 Other specified abnormal findings of blood chemistry: Secondary | ICD-10-CM

## 2018-02-21 DIAGNOSIS — N179 Acute kidney failure, unspecified: Secondary | ICD-10-CM | POA: Diagnosis not present

## 2018-02-21 DIAGNOSIS — Z23 Encounter for immunization: Secondary | ICD-10-CM

## 2018-02-21 DIAGNOSIS — L308 Other specified dermatitis: Secondary | ICD-10-CM | POA: Diagnosis not present

## 2018-02-21 DIAGNOSIS — I10 Essential (primary) hypertension: Secondary | ICD-10-CM

## 2018-02-21 MED ORDER — LOSARTAN POTASSIUM 25 MG PO TABS: 25.0000 mg | ORAL_TABLET | Freq: Every day | ORAL | 3 refills | Status: DC

## 2018-02-21 MED ORDER — HYDROCORTISONE VALERATE 0.2 % EX CREA: 1.0000 "application " | TOPICAL_CREAM | Freq: Two times a day (BID) | CUTANEOUS | 0 refills | Status: DC

## 2018-02-21 MED ORDER — ALLOPURINOL 100 MG PO TABS: 100.0000 mg | ORAL_TABLET | Freq: Two times a day (BID) | ORAL | 2 refills | Status: DC

## 2018-02-21 MED ORDER — CYCLOBENZAPRINE HCL 10 MG PO TABS: 10.0000 mg | ORAL_TABLET | Freq: Three times a day (TID) | ORAL | 2 refills | Status: DC | PRN

## 2018-02-21 NOTE — Progress Notes (Signed)
**Note De-Identified Valladares Obfuscation** BP (!) 101/59   Pulse 78   Temp 97.8 F (36.6 C) (Oral)   Ht _0  (1.778 m)   Wt 251 lb 12.8 oz (114.2 kg)   BMI 36.13 kg/m    Subjective:    Patient ID: Daniel Brock, male    DOB: 12/30/1945, 72 y.o.   MRN: [id number]  HPI: Daniel Brock is a 72 y.o. male presenting on 02/21/2018 for Edema (Bilateral feet and wrist swelling- 1 month follow up) and Rash (bialteral sides x 1 1/2 months)   HPI Swelling in hands and feet elevated renal function Patient is coming in for follow-up on elevated kidney function and elevated liver function and swelling in hands and feet.  He says is really cut back from his sodium is also taking a test for his heart which showed his ejection fraction was good.  Cutting back on his sodium has helped the swelling in his hands and his feet that was there previously is almost completely gone.  Rash Patient has an itchy rash under both of his sides under his arms down the sides of his abdomen slits been very itchy but has no drainage notes small pink.  He has never had similar before and denies any fevers or chills or redness or warmth but just has that small rash that is very itchy  Hypertension Patient is currently on losartan, and their blood pressure today is 101/59. Patient denies any lightheadedness or dizziness. Patient denies headaches, blurred vision, chest pains, shortness of breath, or weakness. Denies any side effects from medication and is content with current medication.   Relevant past medical, surgical, family and social history reviewed and updated as indicated. Interim medical history since our last visit reviewed. Allergies and medications reviewed and updated.  Review of Systems  Constitutional: Negative for chills and fever.  Eyes: Negative for visual disturbance.  Respiratory: Negative for shortness of breath and wheezing.   Cardiovascular: Positive for leg swelling. Negative for chest pain.  Musculoskeletal: Negative for back pain and gait  problem.  Skin: Positive for rash.  All other systems reviewed and are negative.   Per HPI unless specifically indicated above   Allergies as of 02/21/2018      Reactions   Ciprofloxacin Nausea And Vomiting   Ciprofloxacin Hcl    REACTION: NAUSIA AND VOMITING   Sulfamethoxazole-trimethoprim    REACTION: nausea and vomiting   Sulfonamide Derivatives    REACTION: itching   Sulphur [sulfur] Rash      Medication List        Accurate as of 02/21/18 11:44 AM. Always use your most recent med list.          allopurinol 100 MG tablet Commonly known as:  ZYLOPRIM Take 1 tablet (100 mg total) by mouth 2 (two) times daily.   colchicine 0.6 MG tablet Take 1 tablet (0.6 mg total) by mouth daily. May use up to 2 a day on the initial day   cyclobenzaprine 10 MG tablet Commonly known as:  FLEXERIL Take 1 tablet (10 mg total) by mouth 3 (three) times daily as needed for muscle spasms.   Diclofenac Sodium 2 % Soln Place 1 application onto the skin daily as needed. Applies to knees; pt think it's 2 %, uses prn   hydrocortisone valerate cream 0.2 % Commonly known as:  WESTCORT Apply 1 application topically 2 (two) times daily.   losartan 25 MG tablet Commonly known as:  COZAAR Take 1 tablet (25 mg total) **Note De-Identified Garmany Obfuscation** by mouth daily.   OVER THE COUNTER MEDICATION Eye vitamin   sertraline 100 MG tablet Commonly known as:  ZOLOFT Take 0.5 tablets (50 mg total) by mouth daily.   tamsulosin 0.4 MG Caps capsule Commonly known as:  FLOMAX Take 2 capsules (0.8 mg total) by mouth daily.          Objective:    BP (!) 101/59   Pulse 78   Temp 97.8 F (36.6 C) (Oral)   Ht _0  (1.778 m)   Wt 251 lb 12.8 oz (114.2 kg)   BMI 36.13 kg/m   Wt Readings from Last 3 Encounters:  02/21/18 251 lb 12.8 oz (114.2 kg)  01/19/18 264 lb 9.6 oz (120 kg)  12/04/17 252 lb 9.6 oz (114.6 kg)    Physical Exam  Constitutional: He is oriented to person, place, and time. He appears well-developed and  well-nourished. No distress.  Eyes: Conjunctivae are normal. No scleral icterus.  Neck: Neck supple. No thyromegaly present.  Cardiovascular: Normal rate, regular rhythm, normal heart sounds and intact distal pulses.  No murmur heard. Pulmonary/Chest: Effort normal and breath sounds normal. No respiratory distress. He has no wheezes.  Musculoskeletal: Normal range of motion. He exhibits no edema.  Lymphadenopathy:    He has no cervical adenopathy.  Neurological: He is alert and oriented to person, place, and time. Coordination normal.  Skin: Skin is warm and dry. No rash noted. He is not diaphoretic.  Psychiatric: He has a normal mood and affect. His behavior is normal.  Nursing note and vitals reviewed.      Assessment & Plan:   Problem List Items Addressed This Visit      Cardiovascular and Mediastinum   Essential hypertension   Relevant Medications   losartan (COZAAR) 25 MG tablet    Other Visit Diagnoses    Peripheral edema    -  Primary   Much improved and almost resolved, he has cut back on his sodium, he is also had test taking and is heart which showed that his ejection fraction was good   Other eczema       Relevant Medications   hydrocortisone valerate cream (WESTCORT) 0.2 %   Elevated liver function tests       Relevant Orders   CMP14+EGFR (Completed)   AKI (acute kidney injury) ([place])       Relevant Orders   CMP14+EGFR (Completed)   Encounter for immunization       Relevant Orders   Flu vaccine HIGH DOSE PF (Completed)    Rash is likely eczema and recommended hydrocortisone cream for the patient, continue blood pressure medication and we will recheck liver and kidney function   Follow up plan: Return in about 6 months (around 08/23/2018), or if symptoms worsen or fail to improve, for Hypertension recheck.  Counseling provided for all of the vaccine components Orders Placed This Encounter  Procedures  . Flu vaccine HIGH DOSE PF  . [place], MD [facility] 02/21/2018, 11:44 AM

## 2018-02-22 ENCOUNTER — Other Ambulatory Visit: Payer: Self-pay | Admitting: *Deleted

## 2018-02-22 DIAGNOSIS — R7989 Other specified abnormal findings of blood chemistry: Secondary | ICD-10-CM

## 2018-02-22 LAB — CMP14+EGFR
A/G RATIO: 2.5 — AB (ref 1.2–2.2)
ALK PHOS: 110 IU/L (ref 39–117)
ALT: 19 IU/L (ref 0–44)
AST: 16 IU/L (ref 0–40)
Albumin: 4.5 g/dL (ref 3.5–4.8)
BUN / CREAT RATIO: 16 (ref 10–24)
BUN: 24 mg/dL (ref 8–27)
Bilirubin Total: 0.4 mg/dL (ref 0.0–1.2)
CO2: 23 mmol/L (ref 20–29)
CREATININE: 1.46 mg/dL — AB (ref 0.76–1.27)
Calcium: 9 mg/dL (ref 8.6–10.2)
Chloride: 107 mmol/L — ABNORMAL HIGH (ref 96–106)
GFR calc Af Amer: 54 mL/min/{1.73_m2} — ABNORMAL LOW (ref >59)
GFR calc non Af Amer: 47 mL/min/{1.73_m2} — ABNORMAL LOW (ref >59)
GLOBULIN, TOTAL: 1.8 g/dL (ref 1.5–4.5)
Glucose: 104 mg/dL — ABNORMAL HIGH (ref 65–99)
Potassium: 4.4 mmol/L (ref 3.5–5.2)
Sodium: 145 mmol/L — ABNORMAL HIGH (ref 134–144)
Total Protein: 6.3 g/dL (ref 6.0–8.5)

## 2018-02-23 ENCOUNTER — Telehealth: Payer: Self-pay | Admitting: Family Medicine

## 2018-02-23 NOTE — Telephone Encounter (Signed)
**Note De-Identified Reitman Obfuscation** Pt aware of results and that referral was made.

## 2018-03-06 ENCOUNTER — Ambulatory Visit: Payer: BLUE CROSS/BLUE SHIELD | Admitting: Cardiovascular Disease

## 2018-04-09 ENCOUNTER — Other Ambulatory Visit (HOSPITAL_COMMUNITY): Payer: Self-pay | Admitting: Nephrology

## 2018-04-09 ENCOUNTER — Other Ambulatory Visit: Payer: Self-pay | Admitting: Nephrology

## 2018-04-09 DIAGNOSIS — N183 Chronic kidney disease, stage 3 unspecified: Secondary | ICD-10-CM

## 2018-04-09 DIAGNOSIS — E870 Hyperosmolality and hypernatremia: Secondary | ICD-10-CM | POA: Diagnosis not present

## 2018-04-09 DIAGNOSIS — R809 Proteinuria, unspecified: Secondary | ICD-10-CM | POA: Diagnosis not present

## 2018-04-09 DIAGNOSIS — E669 Obesity, unspecified: Secondary | ICD-10-CM | POA: Diagnosis not present

## 2018-04-10 ENCOUNTER — Encounter: Payer: Self-pay | Admitting: Cardiovascular Disease

## 2018-04-10 ENCOUNTER — Ambulatory Visit: Payer: BLUE CROSS/BLUE SHIELD | Admitting: Cardiovascular Disease

## 2018-04-10 VITALS — BP 122/68 | HR 68 | Ht 70.0 in | Wt 254.0 lb

## 2018-04-10 DIAGNOSIS — R0600 Dyspnea, unspecified: Secondary | ICD-10-CM

## 2018-04-10 DIAGNOSIS — E785 Hyperlipidemia, unspecified: Secondary | ICD-10-CM | POA: Diagnosis not present

## 2018-04-10 DIAGNOSIS — R0609 Other forms of dyspnea: Secondary | ICD-10-CM | POA: Diagnosis not present

## 2018-04-10 DIAGNOSIS — N183 Chronic kidney disease, stage 3 unspecified: Secondary | ICD-10-CM

## 2018-04-10 DIAGNOSIS — G4733 Obstructive sleep apnea (adult) (pediatric): Secondary | ICD-10-CM | POA: Diagnosis not present

## 2018-04-10 DIAGNOSIS — I10 Essential (primary) hypertension: Secondary | ICD-10-CM

## 2018-04-10 NOTE — Patient Instructions (Addendum)
**Note De-identified Rodenbaugh Obfuscation** Medication Instructions:   Your physician recommends that you continue on your current medications as directed. Please refer to the Current Medication list given to you today.  Labwork:  NONE  Testing/Procedures:  NONE  Follow-Up:  Your physician recommends that you schedule a follow-up appointment in: as needed.   Any Other Special Instructions Will Be Listed Below (If Applicable).  If you need a refill on your cardiac medications before your next appointment, please call your pharmacy. 

## 2018-04-10 NOTE — Progress Notes (Signed)
**Note De-Identified Nicastro Obfuscation** SUBJECTIVE: The patient returns for follow-up after undergoing cardiovascular testing performed for the evaluation of exertional dyspnea.  Nuclear stress test on 12/13/2017 was deemed an intermediate risk study.  There were no significant ischemic territories.  There were defects seen which may have been due to soft tissue attenuation but some degree of scar could not entirely be ruled out.  LVEF 51%.  Echocardiogram on 12/22/2017 demonstrated normal left ventricular systolic function, LVEF 50 to 55%.  Since I saw him last he was evaluated by Dr. Theador Hawthorne for chronic kidney disease stage III.  He has yet to pursue a sleep study.  He denies chest pain and said his shortness of breath has improved.  He is here with his wife who works for [employer] and has done so for the past 21 years.      Review of Systems: As per "subjective", otherwise negative.  Allergies  Allergen Reactions  . Ciprofloxacin Nausea And Vomiting  . Ciprofloxacin Hcl     REACTION: NAUSIA AND VOMITING  . Sulfamethoxazole-Trimethoprim     REACTION: nausea and vomiting  . Sulfonamide Derivatives     REACTION: itching  . Sulphur [Sulfur] Rash    Current Outpatient Medications  Medication Sig Dispense Refill  . allopurinol (ZYLOPRIM) 100 MG tablet Take 1 tablet (100 mg total) by mouth 2 (two) times daily. 180 tablet 2  . colchicine 0.6 MG tablet Take 1 tablet (0.6 mg total) by mouth daily. May use up to 2 a day on the initial day (Patient taking differently: Take 0.6 mg by mouth as needed. May use up to 2 a day on the initial day) 15 tablet 1  . cyclobenzaprine (FLEXERIL) 10 MG tablet Take 1 tablet (10 mg total) by mouth 3 (three) times daily as needed for muscle spasms. 30 tablet 2  . Diclofenac Sodium (PENNSAID) 2 % SOLN Place 1 application onto the skin daily as needed. Applies to knees; pt think it's 2 %, uses prn 112 g 3  . hydrocortisone valerate cream (WESTCORT) 0.2 % Apply 1  application topically 2 (two) times daily. 45 g 0  . losartan (COZAAR) 25 MG tablet Take 1 tablet (25 mg total) by mouth daily. 90 tablet 3  . OVER THE COUNTER MEDICATION Eye vitamin    . sertraline (ZOLOFT) 100 MG tablet Take 0.5 tablets (50 mg total) by mouth daily. 90 tablet 1  . tamsulosin (FLOMAX) 0.4 MG CAPS capsule Take 2 capsules (0.8 mg total) by mouth daily. 180 capsule 3   No current facility-administered medications for this visit.     Past Medical History:  Diagnosis Date  . Allergy   . Anxiety   . Cataract   . Chronic foot pain   . Chronic kidney disease   . Chronic leg pain   . Depression 2016  . Gout   . Hiatal hernia   . Hyperlipidemia   . Hypertension   . Renal insufficiency   . Varicose veins     Past Surgical History:  Procedure Laterality Date  . CARPAL TUNNEL RELEASE Left   . CARPAL TUNNEL RELEASE Bilateral   . CHOLECYSTECTOMY  2026  . COLONOSCOPY    . COLONOSCOPY W/ POLYPECTOMY    . EYE SURGERY Bilateral 1958, 2004   cataracts  . INGUINAL HERNIA REPAIR    . JOINT REPLACEMENT  2002  . TOTAL KNEE ARTHROPLASTY Right 2002  . ulnar intrapment release  07/2008  . VARICOSE VEIN SURGERY Left 1980's **Note De-Identified Capili Obfuscation** per patient   Dr. Maurene Capes    Social History   Socioeconomic History  . Marital status: Married    Spouse name: Not on file  . Number of children: Not on file  . Years of education: Not on file  . Highest education level: Not on file  Occupational History  . Not on file  Social Needs  . Financial resource strain: Not on file  . Food insecurity:    Worry: Not on file    Inability: Not on file  . Transportation needs:    Medical: Not on file    Non-medical: Not on file  Tobacco Use  . Smoking status: Never Smoker  . Smokeless tobacco: Never Used  Substance and Sexual Activity  . Alcohol use: No  . Drug use: No  . Sexual activity: Never  Lifestyle  . Physical activity:    Days per week: Not on file    Minutes per session: Not on file  .  Stress: Not on file  Relationships  . Social connections:    Talks on phone: Not on file    Gets together: Not on file    Attends religious service: Not on file    Active member of club or organization: Not on file    Attends meetings of clubs or organizations: Not on file    Relationship status: Not on file  . Intimate partner violence:    Fear of current or ex partner: Not on file    Emotionally abused: Not on file    Physically abused: Not on file    Forced sexual activity: Not on file  Other Topics Concern  . Not on file  Social History Narrative  . Not on file     Vitals:   04/10/18 1548  BP: 122/68  Pulse: 68  SpO2: 98%  Weight: 254 lb (115.2 kg)  Height: 5\' 10"  (1.778 m)    Wt Readings from Last 3 Encounters:  04/10/18 254 lb (115.2 kg)  02/21/18 251 lb 12.8 oz (114.2 kg)  01/19/18 264 lb 9.6 oz (120 kg)     PHYSICAL EXAM General: NAD HEENT: Normal. Neck: No JVD, no thyromegaly. Lungs: Clear to auscultation bilaterally with normal respiratory effort. CV: Regular rate and rhythm, normal S1/S2, no S3/S4, no murmur. No pretibial or periankle edema.  No carotid bruit.   Abdomen: Soft, nontender, no distention.  Neurologic: Alert and oriented.  Psych: Normal affect. Skin: Normal. Musculoskeletal: No gross deformities.    ECG: Reviewed above under Subjective   Labs: Lab Results  Component Value Date/Time   K 4.4 02/21/2018 11:45 AM   BUN 24 02/21/2018 11:45 AM   CREATININE 1.46 (H) 02/21/2018 11:45 AM   ALT 19 02/21/2018 11:45 AM   TSH 5.28 03/01/2013 11:39 AM   HGB 13.2 01/19/2018 12:44 PM     Lipids: Lab Results  Component Value Date/Time   LDLCALC 108 (H) 10/02/2017 10:03 AM   LDLDIRECT 108 (H) 06/27/2016 11:17 AM   LDLDIRECT 160.9 03/01/2013 11:39 AM   CHOL 205 (H) 10/02/2017 10:03 AM   TRIG 294 (H) 10/02/2017 10:03 AM   HDL 38 (L) 10/02/2017 10:03 AM       ASSESSMENT AND PLAN: 1.  Exertional dyspnea: Symptomatically improved.   Nuclear stress test showed no evidence of ischemia.  Echocardiogram demonstrated normal left ventricular systolic function.  No further testing is indicated at this time.  I informed with the patient and his wife should symptoms recur and pulmonary disease has **Note De-Identified Weider Obfuscation** been ruled out, to contact me.  2.  Hypertension: Blood pressure is normal.  No changes to therapy.  3.  Hyperlipidemia: Currently on Zetia.  Lipids reviewed above.  4.  Obstructive sleep apnea: He needs a sleep study and I previously made a referral for this.  I again encouraged him to follow-up on this.  5.  Chronic kidney disease stage III: Creatinine 1.46 on 02/21/2018.  He follows with nephrology.    Disposition: Follow up as needed   Kate Sable, M.D., F.A.C.C.

## 2018-04-17 ENCOUNTER — Ambulatory Visit (HOSPITAL_COMMUNITY)
Admission: RE | Admit: 2018-04-17 | Discharge: 2018-04-17 | Disposition: A | Discharge status: Home / Self Care | Payer: BLUE CROSS/BLUE SHIELD | Source: Ambulatory Visit | Attending: Nephrology | Admitting: Nephrology

## 2018-04-17 DIAGNOSIS — R809 Proteinuria, unspecified: Secondary | ICD-10-CM | POA: Diagnosis not present

## 2018-04-17 DIAGNOSIS — E559 Vitamin D deficiency, unspecified: Secondary | ICD-10-CM | POA: Diagnosis not present

## 2018-04-17 DIAGNOSIS — N183 Chronic kidney disease, stage 3 unspecified: Secondary | ICD-10-CM

## 2018-04-17 DIAGNOSIS — D509 Iron deficiency anemia, unspecified: Secondary | ICD-10-CM | POA: Diagnosis not present

## 2018-05-04 DIAGNOSIS — E669 Obesity, unspecified: Secondary | ICD-10-CM | POA: Diagnosis not present

## 2018-05-04 DIAGNOSIS — N183 Chronic kidney disease, stage 3 (moderate): Secondary | ICD-10-CM | POA: Diagnosis not present

## 2018-05-04 DIAGNOSIS — E559 Vitamin D deficiency, unspecified: Secondary | ICD-10-CM | POA: Diagnosis not present

## 2018-05-04 DIAGNOSIS — N200 Calculus of kidney: Secondary | ICD-10-CM | POA: Diagnosis not present

## 2018-08-17 DIAGNOSIS — E559 Vitamin D deficiency, unspecified: Secondary | ICD-10-CM | POA: Diagnosis not present

## 2018-08-17 DIAGNOSIS — N183 Chronic kidney disease, stage 3 (moderate): Secondary | ICD-10-CM | POA: Diagnosis not present

## 2018-08-17 DIAGNOSIS — R809 Proteinuria, unspecified: Secondary | ICD-10-CM | POA: Diagnosis not present

## 2018-08-17 DIAGNOSIS — I10 Essential (primary) hypertension: Secondary | ICD-10-CM | POA: Diagnosis not present

## 2018-12-20 ENCOUNTER — Other Ambulatory Visit: Payer: Self-pay | Admitting: Cardiovascular Disease

## 2018-12-24 ENCOUNTER — Other Ambulatory Visit: Payer: Self-pay | Admitting: *Deleted

## 2018-12-24 DIAGNOSIS — G8929 Other chronic pain: Secondary | ICD-10-CM

## 2018-12-31 ENCOUNTER — Other Ambulatory Visit: Payer: Self-pay | Admitting: *Deleted

## 2019-01-01 ENCOUNTER — Ambulatory Visit (INDEPENDENT_AMBULATORY_CARE_PROVIDER_SITE_OTHER): Payer: BLUE CROSS/BLUE SHIELD | Admitting: Family Medicine

## 2019-01-01 ENCOUNTER — Encounter: Payer: Self-pay | Admitting: Family Medicine

## 2019-01-01 DIAGNOSIS — E782 Mixed hyperlipidemia: Secondary | ICD-10-CM | POA: Diagnosis not present

## 2019-01-01 DIAGNOSIS — M25561 Pain in right knee: Secondary | ICD-10-CM

## 2019-01-01 DIAGNOSIS — I10 Essential (primary) hypertension: Secondary | ICD-10-CM

## 2019-01-01 DIAGNOSIS — R7303 Prediabetes: Secondary | ICD-10-CM

## 2019-01-01 DIAGNOSIS — F411 Generalized anxiety disorder: Secondary | ICD-10-CM

## 2019-01-01 DIAGNOSIS — M1A9XX0 Chronic gout, unspecified, without tophus (tophi): Secondary | ICD-10-CM

## 2019-01-01 DIAGNOSIS — R972 Elevated prostate specific antigen [PSA]: Secondary | ICD-10-CM

## 2019-01-01 DIAGNOSIS — G8929 Other chronic pain: Secondary | ICD-10-CM

## 2019-01-01 DIAGNOSIS — E781 Pure hyperglyceridemia: Secondary | ICD-10-CM

## 2019-01-01 DIAGNOSIS — N400 Enlarged prostate without lower urinary tract symptoms: Secondary | ICD-10-CM

## 2019-01-01 MED ORDER — SERTRALINE HCL 100 MG PO TABS: 50.0000 mg | ORAL_TABLET | Freq: Every day | ORAL | 3 refills | Status: DC

## 2019-01-01 MED ORDER — PENNSAID 2 % TD SOLN: 1.0000 "application " | Freq: Every day | TRANSDERMAL | 3 refills | Status: DC | PRN

## 2019-01-01 MED ORDER — TAMSULOSIN HCL 0.4 MG PO CAPS: 0.8000 mg | ORAL_CAPSULE | Freq: Every day | ORAL | 3 refills | Status: DC

## 2019-01-01 MED ORDER — ROSUVASTATIN CALCIUM 20 MG PO TABS: 20.0000 mg | ORAL_TABLET | Freq: Every day | ORAL | 3 refills | Status: DC

## 2019-01-01 MED ORDER — LOSARTAN POTASSIUM 25 MG PO TABS: 25.0000 mg | ORAL_TABLET | Freq: Every day | ORAL | 3 refills | Status: DC

## 2019-01-01 MED ORDER — ALLOPURINOL 100 MG PO TABS: 100.0000 mg | ORAL_TABLET | Freq: Two times a day (BID) | ORAL | 3 refills | Status: DC

## 2019-01-01 NOTE — Progress Notes (Signed)
**Note De-Identified Nijjar Obfuscation** Virtual Visit Agostino telephone Note  I connected with Daniel Brock on 01/01/19 at 0920 by telephone and verified that I am speaking with the correct person using two identifiers. Daniel Brock is currently located at home and no other people are currently with her during visit. The provider, Fransisca Kaufmann , MD is located in their office at time of visit.  Call ended at [phone]  I discussed the limitations, risks, security and privacy concerns of performing an evaluation and management service by telephone and the availability of in person appointments. I also discussed with the patient that there may be a patient responsible charge related to this service. The patient expressed understanding and agreed to proceed.  BP 129/68 History and Present Illness: Prediabetes  Patient comes in today for recheck of his diabetes. Patient has been currently taking no medication has been diet controlled. Patient is currently on an ACE inhibitor/ARB. Patient has not seen an ophthalmologist this year. Patient denies any issues with their feet.   Hypertension Patient is currently on losartan, and their blood pressure today is unknown because he does not have a machine today. Patient denies any lightheadedness or dizziness. Patient denies headaches, blurred vision, chest pains, shortness of breath, or weakness. Denies any side effects from medication and is content with current medication.   Elevated PSA and BPH Patient takes flomax and denies any symptoms and is doing well.  He denies any prostate issues, he has had some elevated PSAs in the past we will recheck that with his blood work  Gout recheck Patient has not had any issues with gout and takes allopurinol and has not needed colchicine.  He has not had any flareups in quite some time  Patient takes sertraline for anxiety and depression and says he has not had any issues even despite the coronavirus he denies any major issues with the anxiety or depression and  feels pretty good about life and worries that.  He denies any suicidal ideations or thoughts of hurt himself.  No diagnosis found.  Outpatient Encounter Medications as of 01/01/2019  Medication Sig  . allopurinol (ZYLOPRIM) 100 MG tablet Take 1 tablet (100 mg total) by mouth 2 (two) times daily.  . colchicine 0.6 MG tablet Take 1 tablet (0.6 mg total) by mouth daily. May use up to 2 a day on the initial day (Patient taking differently: Take 0.6 mg by mouth as needed. May use up to 2 a day on the initial day)  . cyclobenzaprine (FLEXERIL) 10 MG tablet Take 1 tablet (10 mg total) by mouth 3 (three) times daily as needed for muscle spasms.  . Diclofenac Sodium (PENNSAID) 2 % SOLN Place 1 application onto the skin daily as needed. Applies to knees; pt think it's 2 %, uses prn  . hydrocortisone valerate cream (WESTCORT) 0.2 % Apply 1 application topically 2 (two) times daily.  Marland Kitchen losartan (COZAAR) 25 MG tablet Take 1 tablet (25 mg total) by mouth daily.  Marland Kitchen OVER THE COUNTER MEDICATION Eye vitamin  . sertraline (ZOLOFT) 100 MG tablet Take 0.5 tablets (50 mg total) by mouth daily.  . tamsulosin (FLOMAX) 0.4 MG CAPS capsule Take 2 capsules (0.8 mg total) by mouth daily.   No facility-administered encounter medications on file as of 01/01/2019.     Review of Systems  Constitutional: Negative for chills and fever.  Eyes: Negative for visual disturbance.  Respiratory: Negative for shortness of breath and wheezing.   Cardiovascular: Negative for chest pain and leg **Note De-Identified Frieden Obfuscation** swelling.  Musculoskeletal: Negative for back pain and gait problem.  Skin: Negative for rash.  Neurological: Negative for dizziness, weakness and light-headedness.  All other systems reviewed and are negative.   Observations/Objective: Patient sounds comfortable   Assessment and Plan: Problem List Items Addressed This Visit      Cardiovascular and Mediastinum   Essential hypertension   Relevant Medications   losartan (COZAAR) 25  MG tablet   rosuvastatin (CRESTOR) 20 MG tablet   Other Relevant Orders   CBC with Differential/Platelet   CMP14+EGFR     Genitourinary   BPH (benign prostatic hyperplasia)   Relevant Medications   tamsulosin (FLOMAX) 0.4 MG CAPS capsule   Other Relevant Orders   PSA, total and free     Other   HLD (hyperlipidemia)   Relevant Medications   losartan (COZAAR) 25 MG tablet   rosuvastatin (CRESTOR) 20 MG tablet   Other Relevant Orders   Lipid panel   Gout   Relevant Medications   allopurinol (ZYLOPRIM) 100 MG tablet   ELEVATED PROSTATE SPECIFIC ANTIGEN   Relevant Medications   tamsulosin (FLOMAX) 0.4 MG CAPS capsule   Other Relevant Orders   PSA, total and free   Anxiety state   Relevant Medications   sertraline (ZOLOFT) 100 MG tablet   Pre-diabetes - Primary   Relevant Orders   CBC with Differential/Platelet   CMP14+EGFR   Bayer DCA Hb A1c Waived   Hypertriglyceridemia   Relevant Medications   losartan (COZAAR) 25 MG tablet   rosuvastatin (CRESTOR) 20 MG tablet    Other Visit Diagnoses    Chronic pain of right knee       Relevant Medications   sertraline (ZOLOFT) 100 MG tablet   Diclofenac Sodium (PENNSAID) 2 % SOLN       Follow Up Instructions: Follow up in 6 months and prediabetes and anxiety    I discussed the assessment and treatment plan with the patient. The patient was provided an opportunity to ask questions and all were answered. The patient agreed with the plan and demonstrated an understanding of the instructions.   The patient was advised to call back or seek an in-person evaluation if the symptoms worsen or if the condition fails to improve as anticipated.  The above assessment and management plan was discussed with the patient. The patient verbalized understanding of and has agreed to the management plan. Patient is aware to call the clinic if symptoms persist or worsen. Patient is aware when to return to the clinic for a follow-up visit. Patient  educated on when it is appropriate to go to the emergency department.    I provided 23 minutes of non-face-to-face time during this encounter.    Worthy Rancher, MD

## 2019-01-02 DIAGNOSIS — Z79899 Other long term (current) drug therapy: Secondary | ICD-10-CM | POA: Diagnosis not present

## 2019-01-02 DIAGNOSIS — I10 Essential (primary) hypertension: Secondary | ICD-10-CM | POA: Diagnosis not present

## 2019-01-02 DIAGNOSIS — N183 Chronic kidney disease, stage 3 unspecified: Secondary | ICD-10-CM | POA: Diagnosis not present

## 2019-01-02 DIAGNOSIS — R809 Proteinuria, unspecified: Secondary | ICD-10-CM | POA: Diagnosis not present

## 2019-01-02 DIAGNOSIS — D649 Anemia, unspecified: Secondary | ICD-10-CM | POA: Diagnosis not present

## 2019-01-02 DIAGNOSIS — E559 Vitamin D deficiency, unspecified: Secondary | ICD-10-CM | POA: Diagnosis not present

## 2019-01-03 ENCOUNTER — Ambulatory Visit (INDEPENDENT_AMBULATORY_CARE_PROVIDER_SITE_OTHER): Payer: BLUE CROSS/BLUE SHIELD

## 2019-01-03 ENCOUNTER — Other Ambulatory Visit: Payer: Self-pay

## 2019-01-03 DIAGNOSIS — Z23 Encounter for immunization: Secondary | ICD-10-CM

## 2019-01-04 ENCOUNTER — Other Ambulatory Visit: Payer: BLUE CROSS/BLUE SHIELD

## 2019-01-04 DIAGNOSIS — I10 Essential (primary) hypertension: Secondary | ICD-10-CM | POA: Diagnosis not present

## 2019-01-04 DIAGNOSIS — R7303 Prediabetes: Secondary | ICD-10-CM | POA: Diagnosis not present

## 2019-01-04 DIAGNOSIS — R972 Elevated prostate specific antigen [PSA]: Secondary | ICD-10-CM

## 2019-01-04 DIAGNOSIS — N400 Enlarged prostate without lower urinary tract symptoms: Secondary | ICD-10-CM | POA: Diagnosis not present

## 2019-01-04 DIAGNOSIS — E782 Mixed hyperlipidemia: Secondary | ICD-10-CM

## 2019-01-04 LAB — CMP14+EGFR
ALT: 12 IU/L (ref 0–44)
AST: 12 IU/L (ref 0–40)
Albumin/Globulin Ratio: 2.9 — ABNORMAL HIGH (ref 1.2–2.2)
Albumin: 4.3 g/dL (ref 3.7–4.7)
Alkaline Phosphatase: 114 IU/L (ref 39–117)
BUN/Creatinine Ratio: 17 (ref 10–24)
BUN: 25 mg/dL (ref 8–27)
Bilirubin Total: 0.6 mg/dL (ref 0.0–1.2)
CO2: 23 mmol/L (ref 20–29)
Calcium: 8.8 mg/dL (ref 8.6–10.2)
Chloride: 106 mmol/L (ref 96–106)
Creatinine, Ser: 1.43 mg/dL — ABNORMAL HIGH (ref 0.76–1.27)
GFR calc Af Amer: 55 mL/min/{1.73_m2} — ABNORMAL LOW (ref >59)
GFR calc non Af Amer: 48 mL/min/{1.73_m2} — ABNORMAL LOW (ref >59)
Globulin, Total: 1.5 g/dL (ref 1.5–4.5)
Glucose: 106 mg/dL — ABNORMAL HIGH (ref 65–99)
Potassium: 4.5 mmol/L (ref 3.5–5.2)
Sodium: 143 mmol/L (ref 134–144)
Total Protein: 5.8 g/dL — ABNORMAL LOW (ref 6.0–8.5)

## 2019-01-04 LAB — BAYER DCA HB A1C WAIVED: HB A1C (BAYER DCA - WAIVED): 5.5 % (ref <7.0)

## 2019-01-05 LAB — LIPID PANEL
Chol/HDL Ratio: 5.3 ratio — ABNORMAL HIGH (ref 0.0–5.0)
Cholesterol, Total: 179 mg/dL (ref 100–199)
HDL: 34 mg/dL — ABNORMAL LOW (ref >39)
LDL Chol Calc (NIH): 103 mg/dL — ABNORMAL HIGH (ref 0–99)
Triglycerides: 245 mg/dL — ABNORMAL HIGH (ref 0–149)
VLDL Cholesterol Cal: 42 mg/dL — ABNORMAL HIGH (ref 5–40)

## 2019-01-05 LAB — CBC WITH DIFFERENTIAL/PLATELET
Basophils Absolute: 0 10*3/uL (ref 0.0–0.2)
Basos: 1 %
EOS (ABSOLUTE): 0.1 10*3/uL (ref 0.0–0.4)
Eos: 3 %
Hematocrit: 43.2 % (ref 37.5–51.0)
Hemoglobin: 14.3 g/dL (ref 13.0–17.7)
Immature Grans (Abs): 0 10*3/uL (ref 0.0–0.1)
Immature Granulocytes: 0 %
Lymphocytes Absolute: 1 10*3/uL (ref 0.7–3.1)
Lymphs: 24 %
MCH: 28.7 pg (ref 26.6–33.0)
MCHC: 33.1 g/dL (ref 31.5–35.7)
MCV: 87 fL (ref 79–97)
Monocytes Absolute: 0.4 10*3/uL (ref 0.1–0.9)
Monocytes: 10 %
Neutrophils Absolute: 2.6 10*3/uL (ref 1.4–7.0)
Neutrophils: 62 %
Platelets: 116 10*3/uL — ABNORMAL LOW (ref 150–450)
RBC: 4.98 x10E6/uL (ref 4.14–5.80)
RDW: 13.6 % (ref 11.6–15.4)
WBC: 4.2 10*3/uL (ref 3.4–10.8)

## 2019-01-05 LAB — PSA, TOTAL AND FREE
PSA, Free Pct: 39.5 %
PSA, Free: 0.83 ng/mL
Prostate Specific Ag, Serum: 2.1 ng/mL (ref 0.0–4.0)

## 2019-01-09 DIAGNOSIS — E6609 Other obesity due to excess calories: Secondary | ICD-10-CM | POA: Diagnosis not present

## 2019-01-09 DIAGNOSIS — I129 Hypertensive chronic kidney disease with stage 1 through stage 4 chronic kidney disease, or unspecified chronic kidney disease: Secondary | ICD-10-CM | POA: Diagnosis not present

## 2019-01-09 DIAGNOSIS — N200 Calculus of kidney: Secondary | ICD-10-CM | POA: Diagnosis not present

## 2019-01-09 DIAGNOSIS — E559 Vitamin D deficiency, unspecified: Secondary | ICD-10-CM | POA: Diagnosis not present

## 2019-02-15 IMAGING — DX DG THORACIC SPINE 2V
4 series · 4 of 4 positions shown · non-contrast
Comparison: Radiographs July 21, 2008.

CLINICAL DATA: Acute midline thoracic back pain without known
injury.

EXAM:
THORACIC SPINE 2 VIEWS

[t-spine ap (1 of 2)]
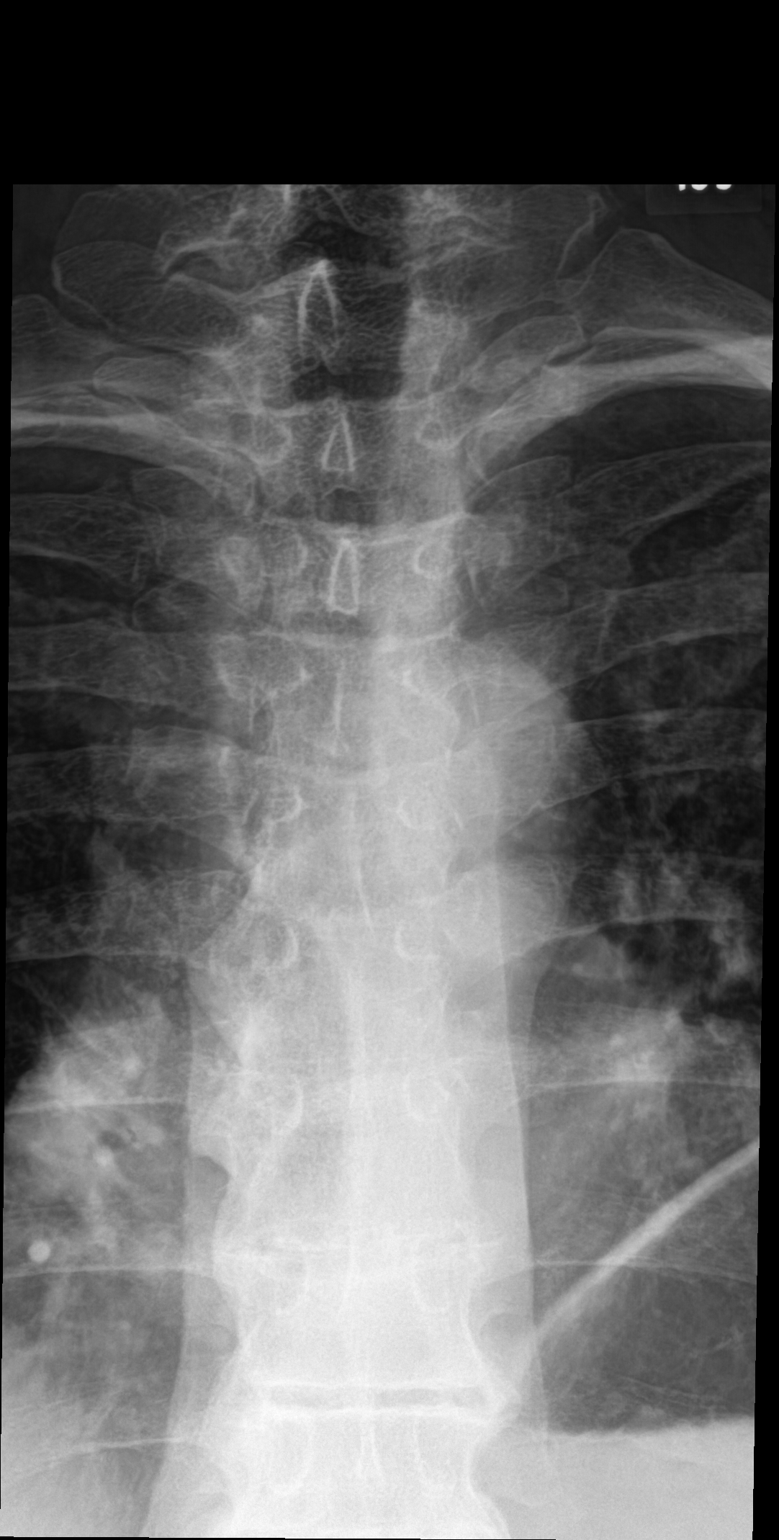

[t-spine lat (1 of 2)]
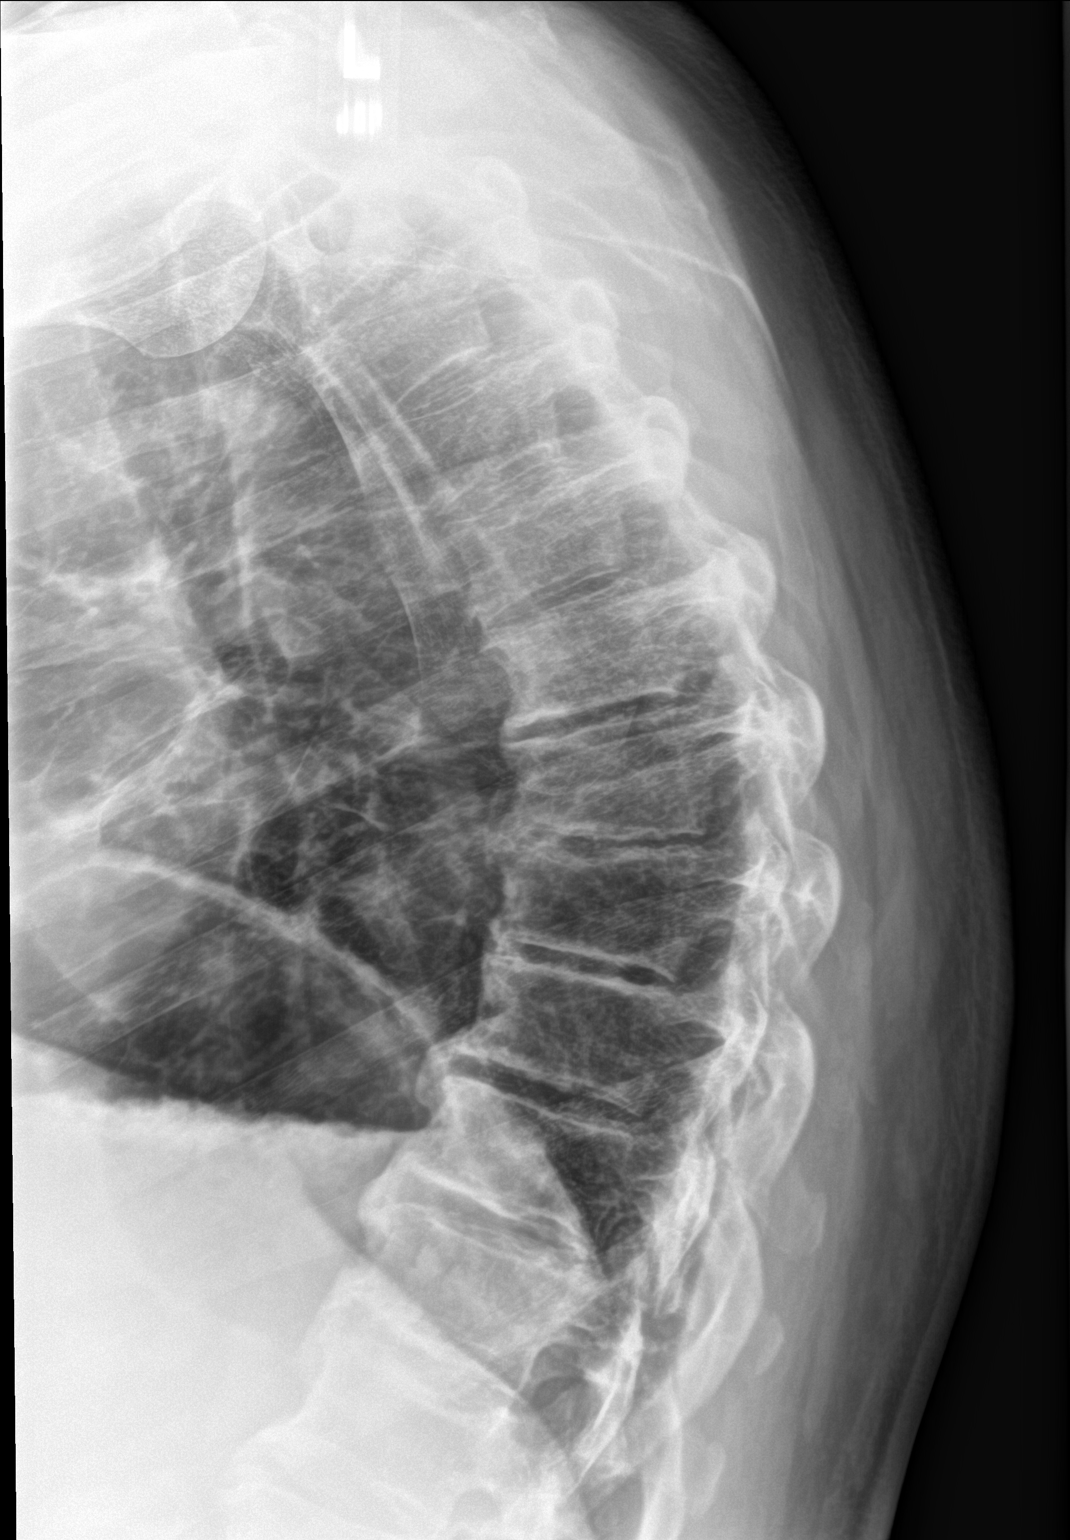

[t-spine ap (2 of 2)]
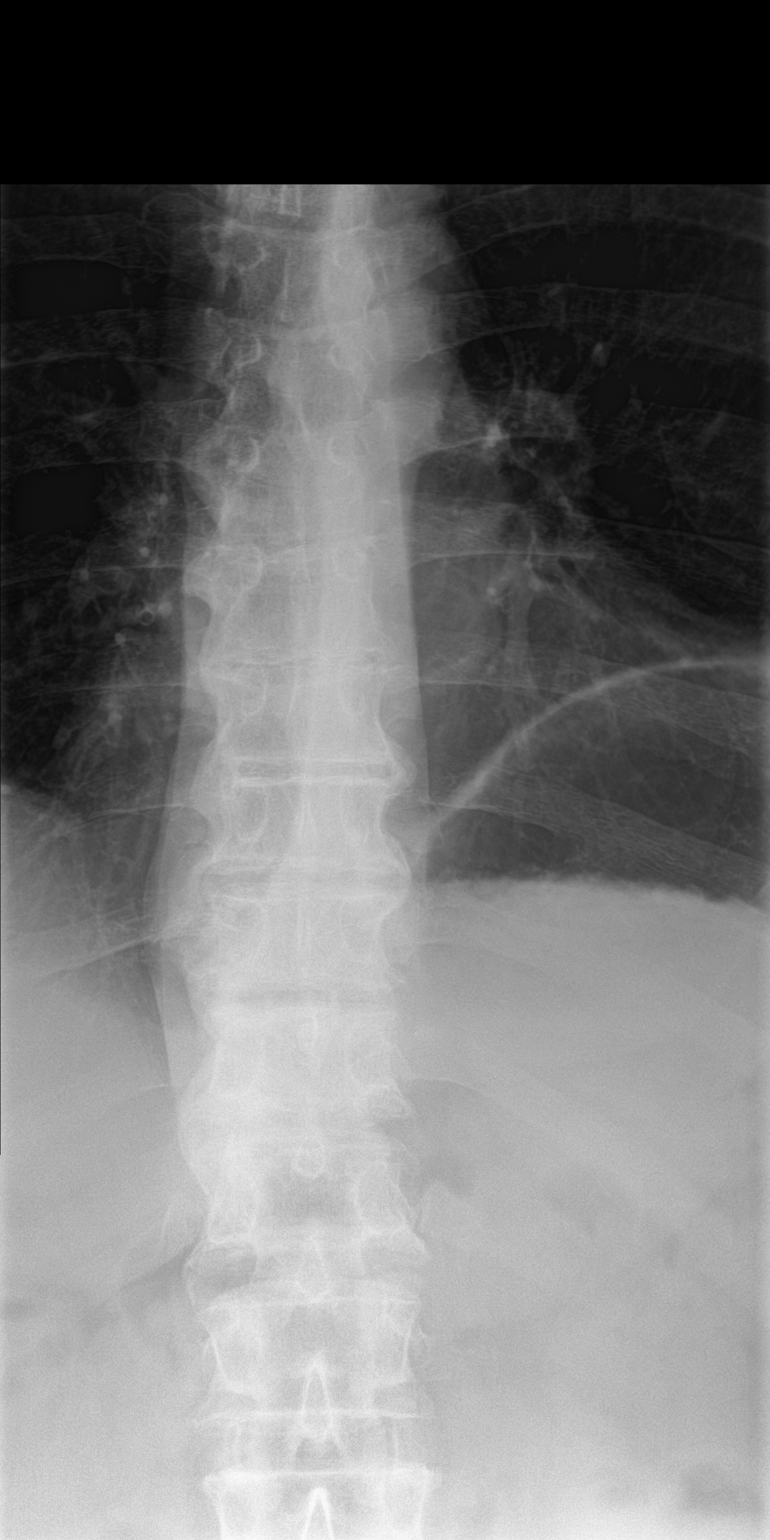

[t-spine lat (2 of 2)]
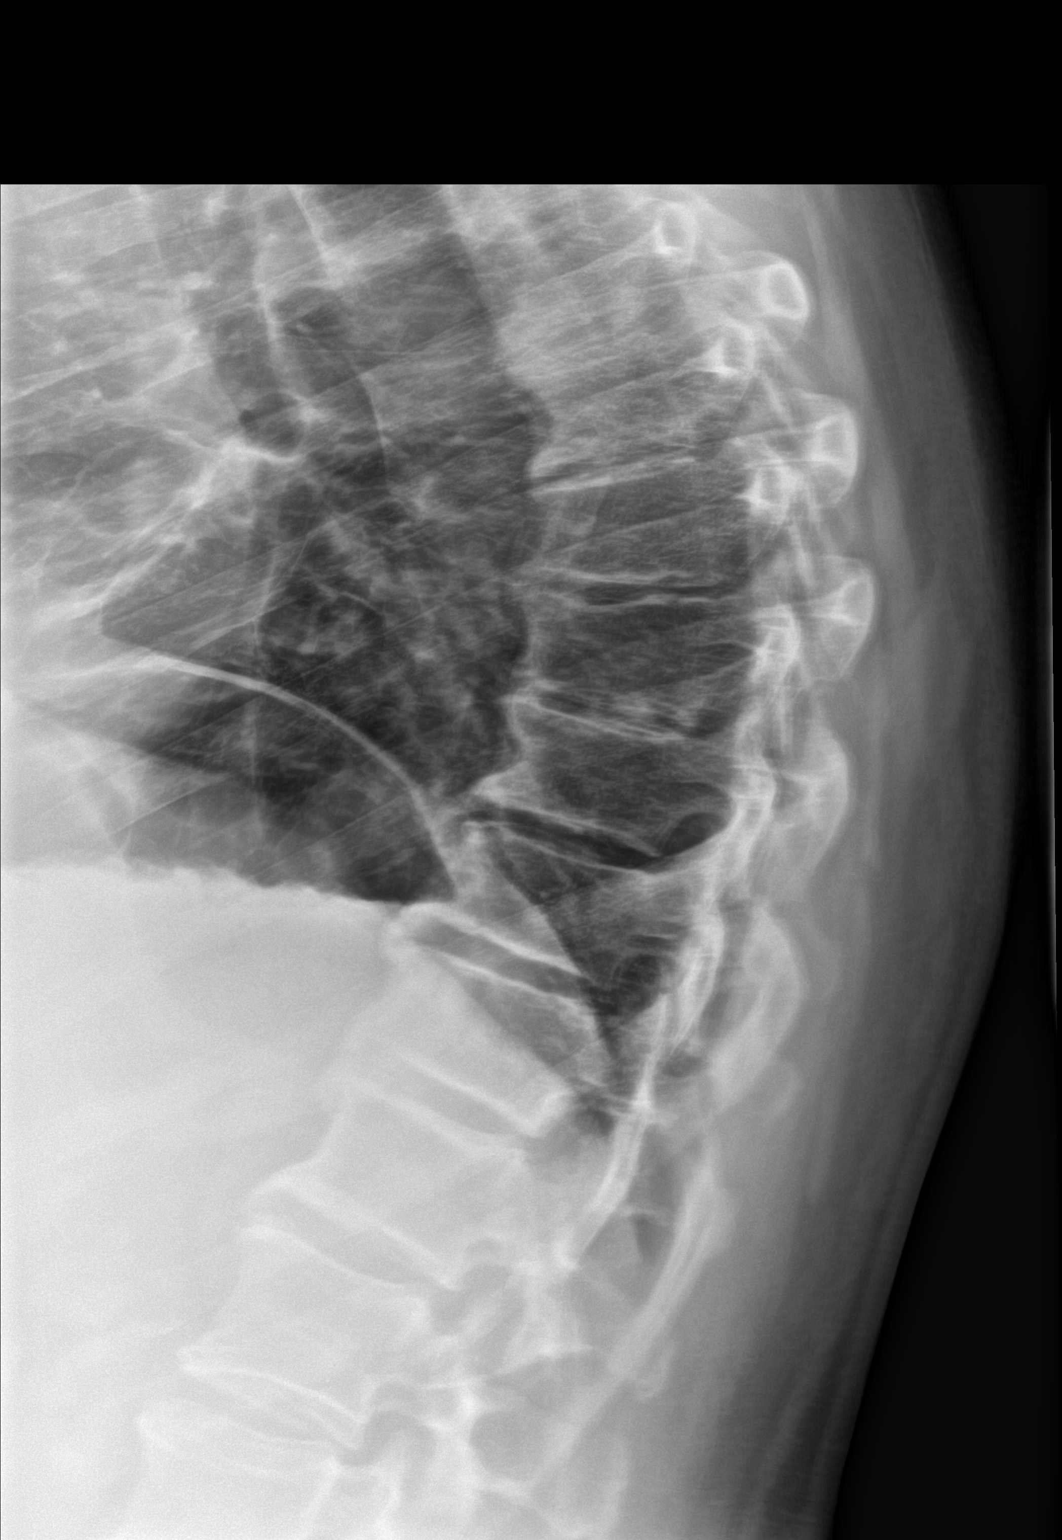

[4 of 4 positions shown; findings below may reference images not displayed]

FINDINGS: No fracture or spondylolisthesis is noted. Anterior osteophyte
formation is noted in middle and lower thoracic spine.
IMPRESSION: Mild degenerative changes as described above. No acute abnormality
seen in the thoracic spine.

## 2019-04-01 ENCOUNTER — Other Ambulatory Visit: Payer: Self-pay | Admitting: *Deleted

## 2019-04-01 DIAGNOSIS — R972 Elevated prostate specific antigen [PSA]: Secondary | ICD-10-CM

## 2019-04-01 DIAGNOSIS — N400 Enlarged prostate without lower urinary tract symptoms: Secondary | ICD-10-CM

## 2019-04-04 DIAGNOSIS — H527 Unspecified disorder of refraction: Secondary | ICD-10-CM | POA: Diagnosis not present

## 2019-04-11 ENCOUNTER — Other Ambulatory Visit: Payer: Self-pay | Admitting: Family Medicine

## 2019-04-11 ENCOUNTER — Other Ambulatory Visit: Payer: Self-pay

## 2019-04-11 ENCOUNTER — Ambulatory Visit: Payer: BLUE CROSS/BLUE SHIELD | Attending: Internal Medicine

## 2019-04-11 DIAGNOSIS — R972 Elevated prostate specific antigen [PSA]: Secondary | ICD-10-CM

## 2019-04-11 DIAGNOSIS — N400 Enlarged prostate without lower urinary tract symptoms: Secondary | ICD-10-CM

## 2019-04-11 DIAGNOSIS — Z20822 Contact with and (suspected) exposure to covid-19: Secondary | ICD-10-CM | POA: Insufficient documentation

## 2019-04-11 NOTE — Telephone Encounter (Signed)
**Note De-Identified [place] Obfuscation** Lm to verify pharmacy. Rx originally sent in October with 3 refills to [facility] in [place].

## 2019-04-11 NOTE — Telephone Encounter (Signed)
**Note De-Identified Boot Obfuscation** What is the name of the medication? Flomax  Have you contacted your pharmacy to request a refill? Yes  Which pharmacy would you like this sent to? [place] was told by someone at [facility] that they have sent [place] a refill request 3 times for this Rx.    Patient notified that their request is being sent to the clinical staff for review and that they should receive a call once it is complete. If they do not receive a call within 24 hours they can check with their pharmacy or our office.

## 2019-04-12 LAB — NOVEL CORONAVIRUS, NAA: SARS-CoV-2, NAA: NOT DETECTED

## 2019-04-15 NOTE — Telephone Encounter (Signed)
**Note De-Identified Kliewer Obfuscation** Aware.  He has refills at [facility] on flomax.

## 2019-04-23 ENCOUNTER — Other Ambulatory Visit: Payer: Self-pay

## 2019-04-23 ENCOUNTER — Other Ambulatory Visit: Payer: BLUE CROSS/BLUE SHIELD

## 2019-04-23 DIAGNOSIS — I129 Hypertensive chronic kidney disease with stage 1 through stage 4 chronic kidney disease, or unspecified chronic kidney disease: Secondary | ICD-10-CM | POA: Diagnosis not present

## 2019-04-23 DIAGNOSIS — E559 Vitamin D deficiency, unspecified: Secondary | ICD-10-CM | POA: Diagnosis not present

## 2019-04-23 DIAGNOSIS — N200 Calculus of kidney: Secondary | ICD-10-CM | POA: Diagnosis not present

## 2019-05-15 DIAGNOSIS — N200 Calculus of kidney: Secondary | ICD-10-CM | POA: Diagnosis not present

## 2019-05-15 DIAGNOSIS — E6609 Other obesity due to excess calories: Secondary | ICD-10-CM | POA: Diagnosis not present

## 2019-05-15 DIAGNOSIS — I129 Hypertensive chronic kidney disease with stage 1 through stage 4 chronic kidney disease, or unspecified chronic kidney disease: Secondary | ICD-10-CM | POA: Diagnosis not present

## 2019-05-15 DIAGNOSIS — E559 Vitamin D deficiency, unspecified: Secondary | ICD-10-CM | POA: Diagnosis not present

## 2019-05-19 ENCOUNTER — Ambulatory Visit: Payer: BLUE CROSS/BLUE SHIELD | Attending: Internal Medicine

## 2019-05-19 DIAGNOSIS — Z23 Encounter for immunization: Secondary | ICD-10-CM

## 2019-05-19 NOTE — Progress Notes (Signed)
**Note De-identified [place] Obfuscation**   **Note De-Identified [place] Obfuscation** Covid-19 Vaccination Clinic  Name:  Daniel Brock    MRN: [id number] DOB: 05-25-1945  05/19/2019  [name] was observed post Covid-19 immunization for 15 minutes without incidence. He was provided with Vaccine Information Sheet and instruction to access the V-Safe system.   [name] was instructed to call 911 with any severe reactions post vaccine: Marland Kitchen Difficulty breathing  . Swelling of your face and throat  . A fast heartbeat  . A bad rash all over your body  . Dizziness and weakness    Immunizations Administered    Name Date Dose VIS Date Route   Pfizer COVID-19 Vaccine 05/19/2019 12:54 PM 0.3 mL 03/01/2019 Intramuscular   Manufacturer: [place]   Lot: [id number]   [place]: [id number]

## 2019-05-20 ENCOUNTER — Other Ambulatory Visit: Payer: Self-pay

## 2019-05-20 ENCOUNTER — Encounter: Payer: Self-pay | Admitting: Nutrition

## 2019-05-20 ENCOUNTER — Encounter: Payer: BLUE CROSS/BLUE SHIELD | Attending: Nephrology | Admitting: Nutrition

## 2019-05-20 VITALS — Ht 71.0 in | Wt 239.0 lb

## 2019-05-20 DIAGNOSIS — E781 Pure hyperglyceridemia: Secondary | ICD-10-CM | POA: Diagnosis not present

## 2019-05-20 DIAGNOSIS — E669 Obesity, unspecified: Secondary | ICD-10-CM

## 2019-05-20 DIAGNOSIS — N183 Chronic kidney disease, stage 3 unspecified: Secondary | ICD-10-CM

## 2019-05-22 ENCOUNTER — Encounter: Payer: Self-pay | Admitting: Nutrition

## 2019-05-22 NOTE — Patient Instructions (Addendum)
**Note De-Identified Jarmon Obfuscation** Goals Eat three balanced meals at times discused Avoid processed foods, fast foods and high sodium foods. Drink only water-a gallon per day Limited phosphorous rich foods. Walk 30 minutes a day when able. Use Mrs. Dash and other herbs and spices instead of salt or salt seasonings. Limit protein to 3-4 oz per meal.

## 2019-05-22 NOTE — Progress Notes (Signed)
**Note De-identified Tan Obfuscation**  **Note De-Identified Everly Obfuscation** Medical Nutrition Therapy:  Appt start time: 1100 end time:  1200.   Assessment:  Primary concerns today:  Stg 3 CKD secondary to HTN, overweight. Was told to watch his phosphorus levels. PMH: Hyperlipidemia, HTN and depression. Has cataracts. Dr. Theador Hawthorne is his nephrologist. Eats 3 meals per day, some snacks. PCP Dr. Warrick Parisian. History of low Vit D levels. Phos 3.5.Creatine1.4 mg/dl.   DOesn't drink a lot of water. Diet has been higher in processed foods at time. Hasn't been aggressive in his HTN management.  He is willing to work better on his diet and meal planning to help preserve kidney function and prevent DM.   Lab Results  Component Value Date   HGBA1C 5.5 01/04/2019    Preferred Learning Style:   Auditory  Visual  Hands on  Learning Readiness:   Ready  Change in progress   MEDICATIONS:   DIETARY INTAKE:    Eats 3 meals per day. 1-2 snacks at times.   Usual physical activity: walks some   Estimated energy needs: 1800  calories 200 g carbohydrates 135 g protein 50 g fat  Progress Towards Goal(s):  In progress.   Nutritional Diagnosis:  NB-1.1 Food and nutrition-related knowledge deficit As related to CKD.  As evidenced by Creatine and eGFR.    Intervention:  Nutrition for Chronic Kidney Disease, low sodium low phosphorus diet. Meal planning, reading food labels, benefits of exercise, healthy weight loss tips.   Goals Eat three balanced meals at times discused Avoid processed foods, fast foods and high sodium foods. Drink only water-1 gallon per day Limited phosphorous rich foods. Walk 30 minutes a day when able. Use Mrs. Dash and other herbs and spices instead of salt or salt seasonings. Limit protein to 3-4 oz per meal.   Teaching Method Utilized:  Visual Auditory Hands on  Handouts given during visit include:  Nutrition for Chronic Kidney Disease  My Plate  Low Salt Diet   Barriers to learning/adherence to lifestyle change:  none  Demonstrated degree of understanding Brandon:  Teach Back   Monitoring/Evaluation:  Dietary intake, exercise, , and body weight in 2-3 month(s).

## 2019-06-18 ENCOUNTER — Ambulatory Visit: Payer: BLUE CROSS/BLUE SHIELD | Attending: Internal Medicine

## 2019-06-18 DIAGNOSIS — Z23 Encounter for immunization: Secondary | ICD-10-CM

## 2019-06-18 NOTE — Progress Notes (Signed)
**Note De-identified Varano Obfuscation**   **Note De-Identified Capitano Obfuscation** Covid-19 Vaccination Clinic  Name:  Jerad Gardipee Brienza    MRN: [id number] DOB: 28-May-1945  06/18/2019  Mr. Frieze was observed post Covid-19 immunization for 15 minutes without incident. He was provided with Vaccine Information Sheet and instruction to access the V-Safe system.   Mr. Fambrough was instructed to call 911 with any severe reactions post vaccine: Marland Kitchen Difficulty breathing  . Swelling of face and throat  . A fast heartbeat  . A bad rash all over body  . Dizziness and weakness   Immunizations Administered    Name Date Dose VIS Date Route   Pfizer COVID-19 Vaccine 06/18/2019  2:31 PM 0.3 mL 03/01/2019 Intramuscular   Manufacturer: [place]   Lot: [id number]   [place]: [id number]

## 2019-06-27 ENCOUNTER — Ambulatory Visit: Payer: BLUE CROSS/BLUE SHIELD | Admitting: Nutrition

## 2019-07-04 ENCOUNTER — Other Ambulatory Visit: Payer: Self-pay

## 2019-07-04 ENCOUNTER — Encounter: Payer: Self-pay | Admitting: Family Medicine

## 2019-07-04 ENCOUNTER — Ambulatory Visit (INDEPENDENT_AMBULATORY_CARE_PROVIDER_SITE_OTHER): Payer: BLUE CROSS/BLUE SHIELD | Admitting: Family Medicine

## 2019-07-04 VITALS — BP 99/60 | HR 55 | Temp 97.0°F | Ht 71.0 in | Wt 233.2 lb

## 2019-07-04 DIAGNOSIS — E782 Mixed hyperlipidemia: Secondary | ICD-10-CM

## 2019-07-04 DIAGNOSIS — N400 Enlarged prostate without lower urinary tract symptoms: Secondary | ICD-10-CM | POA: Diagnosis not present

## 2019-07-04 DIAGNOSIS — I10 Essential (primary) hypertension: Secondary | ICD-10-CM

## 2019-07-04 DIAGNOSIS — R7303 Prediabetes: Secondary | ICD-10-CM | POA: Diagnosis not present

## 2019-07-04 DIAGNOSIS — E781 Pure hyperglyceridemia: Secondary | ICD-10-CM

## 2019-07-04 LAB — BAYER DCA HB A1C WAIVED: HB A1C (BAYER DCA - WAIVED): 5.6 % (ref <7.0)

## 2019-07-04 NOTE — Progress Notes (Signed)
**Note De-Identified Albaugh Obfuscation** BP 99/60   Pulse (!) 55   Temp (!) 97 F (36.1 C) (Temporal)   Ht 5' 11"  (1.803 m)   Wt 233 lb 4 oz (105.8 kg)   BMI 32.53 kg/m    Subjective:   Patient ID: Daniel Brock, male    DOB: 1946/01/19, 74 y.o.   MRN: [id number]  HPI: Daniel Brock is a 74 y.o. male presenting on 07/04/2019 for Annual Exam   HPI Adult well exam and recheck of chronic issues Patient is coming in for adult well exam and physical today and recheck of chronic issues.  He denies any major issues or concerns today.  We will check his prostate next time.  Hypertension Patient is currently on losartan, and their blood pressure today is 99/60.  Patient complains of dizziness when he stands up quickly and it has been increasing over the past year.  When he takes his blood pressure when it is like this it is down quite a bit even lower than it is today 99/60 here in the office.  Patient denies headaches, blurred vision, chest pains, shortness of breath, or weakness. Denies any side effects from medication and is content with current medication.   Prediabetes Patient comes in today for recheck of his diabetes. Patient has been currently taking diet controlled and he is A1c is 5.6 today. Patient is currently on an ACE inhibitor/ARB. Patient has not seen an ophthalmologist this year. Patient denies any issues with their feet.   Hyperlipidemia Patient is coming in for recheck of his hyperlipidemia. The patient is currently taking Crestor. They deny any issues with myalgias or history of liver damage from it. They deny any focal numbness or weakness or chest pain.   BPH Patient is coming in for recheck on BPH Symptoms: None currently, controlled with medication Medication: Flomax 0.8 mg daily Last PSA: October 2020, 2.1  Relevant past medical, surgical, family and social history reviewed and updated as indicated. Interim medical history since our last visit reviewed. Allergies and medications reviewed and  updated.  Review of Systems  Constitutional: Negative for chills and fever.  Eyes: Negative for visual disturbance.  Respiratory: Negative for shortness of breath and wheezing.   Cardiovascular: Negative for chest pain and leg swelling.  Musculoskeletal: Negative for back pain and gait problem.  Skin: Negative for rash.  Neurological: Negative for dizziness, weakness and light-headedness.  All other systems reviewed and are negative.   Per HPI unless specifically indicated above   Allergies as of 07/04/2019      Reactions   Ciprofloxacin Nausea And Vomiting   Ciprofloxacin Hcl    REACTION: NAUSIA AND VOMITING   Sulfamethoxazole-trimethoprim    REACTION: nausea and vomiting   Sulfonamide Derivatives    REACTION: itching   Sulphur [sulfur] Rash      Medication List       Accurate as of July 04, 2019 10:27 AM. If you have any questions, ask your nurse or doctor.        STOP taking these medications   Pen[place]aid 2 % Soln Generic drug: Diclofenac Sodium Stopped by: Fransisca Kaufmann Kamoni Depree, MD     TAKE these medications   allopurinol 100 MG tablet Commonly known as: ZYLOPRIM Take 1 tablet (100 mg total) by mouth 2 (two) times daily.   colchicine 0.6 MG tablet Take 1 tablet (0.6 mg total) by mouth daily. May use up to 2 a day on the initial day What changed:   when to take **Note De-Identified Summey Obfuscation** this  reasons to take this   cyclobenzaprine 10 MG tablet Commonly known as: FLEXERIL Take 1 tablet (10 mg total) by mouth 3 (three) times daily as needed for muscle spasms.   hydrocortisone valerate cream 0.2 % Commonly known as: WESTCORT Apply 1 application topically 2 (two) times daily.   losartan 25 MG tablet Commonly known as: COZAAR Take 1 tablet (25 mg total) by mouth daily.   OVER THE COUNTER MEDICATION Eye vitamin   rosuvastatin 20 MG tablet Commonly known as: Crestor Take 1 tablet (20 mg total) by mouth daily.   sertraline 100 MG tablet Commonly known as: ZOLOFT Take 0.5  tablets (50 mg total) by mouth daily.   tamsulosin 0.4 MG Caps capsule Commonly known as: FLOMAX Take 2 capsules (0.8 mg total) by mouth daily.        Objective:   BP 99/60   Pulse (!) 55   Temp (!) 97 F (36.1 C) (Temporal)   Ht 5' 11"  (1.803 m)   Wt 233 lb 4 oz (105.8 kg)   BMI 32.53 kg/m   Wt Readings from Last 3 Encounters:  07/04/19 233 lb 4 oz (105.8 kg)  05/20/19 239 lb (108.4 kg)  04/10/18 254 lb (115.2 kg)    Physical Exam Vitals and nursing note reviewed.  Constitutional:      General: He is not in acute distress.    Appearance: He is well-developed. He is not diaphoretic.  Eyes:     General: No scleral icterus.    Conjunctiva/sclera: Conjunctivae normal.  Neck:     Thyroid: No thyromegaly.  Cardiovascular:     Rate and Rhythm: Normal rate and regular rhythm.     Heart sounds: Normal heart sounds. No murmur.  Pulmonary:     Effort: Pulmonary effort is normal. No respiratory distress.     Breath sounds: Normal breath sounds. No wheezing.  Musculoskeletal:        General: Normal range of motion.     Cervical back: Neck supple.  Lymphadenopathy:     Cervical: No cervical adenopathy.  Skin:    General: Skin is warm and dry.     Findings: No rash.  Neurological:     Mental Status: He is alert and oriented to person, place, and time.     Coordination: Coordination normal.  Psychiatric:        Behavior: Behavior normal.       Assessment & Plan:   Problem List Items Addressed This Visit      Cardiovascular and Mediastinum   Essential hypertension   Relevant Orders   CMP14+EGFR     Genitourinary   BPH (benign prostatic hyperplasia)   Relevant Orders   CBC with Differential/Platelet     Other   HLD (hyperlipidemia)   Relevant Orders   Lipid panel   Pre-diabetes - Primary   Relevant Orders   Bayer DCA Hb A1c Waived   Hypertriglyceridemia      Will stop the losartan because of orthostatic dizziness, continue other medication, if he  continues to have orthostatic dizziness then we may have to reduce the Flomax.  Follow up plan: Return in about 3 months (around 10/03/2019), or if symptoms worsen or fail to improve, for Hypertension and cholesterol and prediabetes.  Counseling provided for all of the vaccine components Orders Placed This Encounter  Procedures  . Bayer [facility] Hb A1c [place], MD [facility] 07/04/2019, 10:27 AM

## 2019-07-05 LAB — CBC WITH DIFFERENTIAL/PLATELET
Basophils Absolute: 0 10*3/uL (ref 0.0–0.2)
Basos: 1 %
EOS (ABSOLUTE): 0.2 10*3/uL (ref 0.0–0.4)
Eos: 4 %
Hematocrit: 43.6 % (ref 37.5–51.0)
Hemoglobin: 14.4 g/dL (ref 13.0–17.7)
Immature Grans (Abs): 0 10*3/uL (ref 0.0–0.1)
Immature Granulocytes: 0 %
Lymphocytes Absolute: 1.4 10*3/uL (ref 0.7–3.1)
Lymphs: 26 %
MCH: 28.9 pg (ref 26.6–33.0)
MCHC: 33 g/dL (ref 31.5–35.7)
MCV: 87 fL (ref 79–97)
Monocytes Absolute: 0.5 10*3/uL (ref 0.1–0.9)
Monocytes: 10 %
Neutrophils Absolute: 3.1 10*3/uL (ref 1.4–7.0)
Neutrophils: 59 %
Platelets: 128 10*3/uL — ABNORMAL LOW (ref 150–450)
RBC: 4.99 x10E6/uL (ref 4.14–5.80)
RDW: 13.5 % (ref 11.6–15.4)
WBC: 5.2 10*3/uL (ref 3.4–10.8)

## 2019-07-05 LAB — CMP14+EGFR
ALT: 13 IU/L (ref 0–44)
AST: 14 IU/L (ref 0–40)
Albumin/Globulin Ratio: 2.8 — ABNORMAL HIGH (ref 1.2–2.2)
Albumin: 4.5 g/dL (ref 3.7–4.7)
Alkaline Phosphatase: 97 IU/L (ref 39–117)
BUN/Creatinine Ratio: 22 (ref 10–24)
BUN: 31 mg/dL — ABNORMAL HIGH (ref 8–27)
Bilirubin Total: 0.4 mg/dL (ref 0.0–1.2)
CO2: 25 mmol/L (ref 20–29)
Calcium: 8.9 mg/dL (ref 8.6–10.2)
Chloride: 105 mmol/L (ref 96–106)
Creatinine, Ser: 1.39 mg/dL — ABNORMAL HIGH (ref 0.76–1.27)
GFR calc Af Amer: 57 mL/min/{1.73_m2} — ABNORMAL LOW (ref >59)
GFR calc non Af Amer: 49 mL/min/{1.73_m2} — ABNORMAL LOW (ref >59)
Globulin, Total: 1.6 g/dL (ref 1.5–4.5)
Glucose: 91 mg/dL (ref 65–99)
Potassium: 4.6 mmol/L (ref 3.5–5.2)
Sodium: 143 mmol/L (ref 134–144)
Total Protein: 6.1 g/dL (ref 6.0–8.5)

## 2019-07-05 LAB — LIPID PANEL
Chol/HDL Ratio: 4.1 ratio (ref 0.0–5.0)
Cholesterol, Total: 160 mg/dL (ref 100–199)
HDL: 39 mg/dL — ABNORMAL LOW (ref >39)
LDL Chol Calc (NIH): 83 mg/dL (ref 0–99)
Triglycerides: 224 mg/dL — ABNORMAL HIGH (ref 0–149)
VLDL Cholesterol Cal: 38 mg/dL (ref 5–40)

## 2019-08-22 ENCOUNTER — Other Ambulatory Visit: Payer: Self-pay

## 2019-08-22 ENCOUNTER — Other Ambulatory Visit: Payer: BLUE CROSS/BLUE SHIELD

## 2019-08-22 ENCOUNTER — Other Ambulatory Visit: Payer: Self-pay | Admitting: *Deleted

## 2019-08-22 DIAGNOSIS — I129 Hypertensive chronic kidney disease with stage 1 through stage 4 chronic kidney disease, or unspecified chronic kidney disease: Secondary | ICD-10-CM

## 2019-08-22 DIAGNOSIS — R809 Proteinuria, unspecified: Secondary | ICD-10-CM | POA: Diagnosis not present

## 2019-08-22 DIAGNOSIS — D631 Anemia in chronic kidney disease: Secondary | ICD-10-CM | POA: Diagnosis not present

## 2019-08-22 DIAGNOSIS — E559 Vitamin D deficiency, unspecified: Secondary | ICD-10-CM

## 2019-08-22 DIAGNOSIS — N1831 Chronic kidney disease, stage 3a: Secondary | ICD-10-CM | POA: Diagnosis not present

## 2019-08-22 DIAGNOSIS — E6609 Other obesity due to excess calories: Secondary | ICD-10-CM

## 2019-08-22 DIAGNOSIS — N200 Calculus of kidney: Secondary | ICD-10-CM

## 2019-08-22 DIAGNOSIS — E211 Secondary hyperparathyroidism, not elsewhere classified: Secondary | ICD-10-CM

## 2019-08-22 NOTE — Addendum Note (Signed)
**Note De-Identified Fiallo Obfuscation** Addended by: Zannie Cove on: 08/22/2019 10:45 AM   Modules accepted: Orders

## 2019-08-30 DIAGNOSIS — D696 Thrombocytopenia, unspecified: Secondary | ICD-10-CM | POA: Diagnosis not present

## 2019-08-30 DIAGNOSIS — E870 Hyperosmolality and hypernatremia: Secondary | ICD-10-CM | POA: Diagnosis not present

## 2019-08-30 DIAGNOSIS — I129 Hypertensive chronic kidney disease with stage 1 through stage 4 chronic kidney disease, or unspecified chronic kidney disease: Secondary | ICD-10-CM | POA: Diagnosis not present

## 2019-08-30 DIAGNOSIS — E559 Vitamin D deficiency, unspecified: Secondary | ICD-10-CM | POA: Diagnosis not present

## 2019-10-03 ENCOUNTER — Other Ambulatory Visit: Payer: Self-pay

## 2019-10-03 ENCOUNTER — Encounter: Payer: Self-pay | Admitting: Family Medicine

## 2019-10-03 ENCOUNTER — Ambulatory Visit: Payer: BLUE CROSS/BLUE SHIELD | Admitting: Family Medicine

## 2019-10-03 VITALS — BP 113/71 | HR 74 | Temp 98.6°F | Ht 71.0 in | Wt 232.0 lb

## 2019-10-03 DIAGNOSIS — I10 Essential (primary) hypertension: Secondary | ICD-10-CM | POA: Diagnosis not present

## 2019-10-03 DIAGNOSIS — R7303 Prediabetes: Secondary | ICD-10-CM

## 2019-10-03 DIAGNOSIS — E782 Mixed hyperlipidemia: Secondary | ICD-10-CM

## 2019-10-03 DIAGNOSIS — E781 Pure hyperglyceridemia: Secondary | ICD-10-CM

## 2019-10-03 LAB — BAYER DCA HB A1C WAIVED: HB A1C (BAYER DCA - WAIVED): 5.2 % (ref <7.0)

## 2019-10-03 NOTE — Progress Notes (Signed)
**Note De-Identified Arnell Obfuscation** BP 113/71   Pulse 74   Temp 98.6 F (37 C)   Ht 5' 11"  (1.803 m)   Wt 232 lb (105.2 kg)   SpO2 98%   BMI 32.36 kg/m    Subjective:   Patient ID: Daniel Brock, male    DOB: 1945-07-29, 74 y.o.   MRN: [id number]  HPI: Daniel Brock is a 74 y.o. male presenting on 10/03/2019 for Medical Management of Chronic Issues   HPI Prediabetes Patient comes in today for recheck of his diabetes. Patient has been currently taking no medication currently monitoring, will check A1c today. Patient is not currently on an ACE inhibitor/ARB. Patient has not seen an ophthalmologist this year. Patient denies any issues with their feet. The symptom started onset as an adult hypertriglyceridemia and hyperlipidemia and hypertension ARE RELATED TO DM   Hyperlipidemia and hypertriglyceridemia Patient is coming in for recheck of his hyperlipidemia. The patient is currently taking Crestor. They deny any issues with myalgias or history of liver damage from it. They deny any focal numbness or weakness or chest pain.   Hypertension Patient is currently on no medication currently, and their blood pressure today is 113/71. Patient denies any lightheadedness or dizziness. Patient denies headaches, blurred vision, chest pains, shortness of breath, or weakness. Denies any side effects from medication and is content with current medication.     Relevant past medical, surgical, family and social history reviewed and updated as indicated. Interim medical history since our last visit reviewed. Allergies and medications reviewed and updated.  Review of Systems  Constitutional: Negative for chills and fever.  Respiratory: Negative for shortness of breath and wheezing.   Cardiovascular: Negative for chest pain and leg swelling.  Musculoskeletal: Negative for back pain and gait problem.  Skin: Negative for rash.  Neurological: Negative for dizziness, weakness and numbness.  All other systems reviewed and are  negative.   Per HPI unless specifically indicated above   Allergies as of 10/03/2019      Reactions   Ciprofloxacin Nausea And Vomiting   Ciprofloxacin Hcl    REACTION: NAUSIA AND VOMITING   Sulfamethoxazole-trimethoprim    REACTION: nausea and vomiting   Sulfonamide Derivatives    REACTION: itching   Sulphur [sulfur] Rash      Medication List       Accurate as of October 03, 2019  1:46 PM. If you have any questions, ask your nurse or doctor.        STOP taking these medications   hydrocortisone valerate cream 0.2 % Commonly known as: WESTCORT Stopped by: Fransisca Kaufmann Harlen Danford, MD     TAKE these medications   allopurinol 100 MG tablet Commonly known as: ZYLOPRIM Take 1 tablet (100 mg total) by mouth 2 (two) times daily. What changed:   when to take this  reasons to take this   colchicine 0.6 MG tablet Take 1 tablet (0.6 mg total) by mouth daily. May use up to 2 a day on the initial day What changed:   when to take this  reasons to take this   cyclobenzaprine 10 MG tablet Commonly known as: FLEXERIL Take 1 tablet (10 mg total) by mouth 3 (three) times daily as needed for muscle spasms.   OVER THE COUNTER MEDICATION Eye vitamin   rosuvastatin 20 MG tablet Commonly known as: Crestor Take 1 tablet (20 mg total) by mouth daily.   sertraline 100 MG tablet Commonly known as: ZOLOFT Take 0.5 tablets (50 mg total) by mouth **Note De-Identified D[place]ar Obfuscation** daily.   tamsulosin 0.4 MG Caps capsule Commonly known as: FLOMAX Take 2 capsules (0.8 mg total) by mouth daily.        Objective:   BP 113/71   Pulse 74   Temp 98.6 F (37 C)   Ht 5' 11"  (1.803 m)   Wt 232 lb (105.2 kg)   SpO2 98%   BMI 32.36 kg/m   Wt Readings from Last 3 Encounters:  10/03/19 232 lb (105.2 kg)  07/04/19 233 lb 4 oz (105.8 kg)  05/20/19 239 lb (108.4 kg)    Physical Exam Vitals and nursing note reviewed.  Constitutional:      General: He is not in acute distress.    Appearance: He is well-developed. He  is not diaphoretic.  Eyes:     General: No scleral icterus.    Conjunctiva/sclera: Conjunctivae normal.  Neck:     Thyroid: No thyromegaly.  Cardiovascular:     Rate and Rhythm: Normal rate and regular rhythm.     Heart sounds: Normal heart sounds. No murmur heard.   Pulmonary:     Effort: Pulmonary effort is normal. No respiratory distress.     Breath sounds: Normal breath sounds. No wheezing.  Musculoskeletal:        General: Normal range of motion.     Cervical back: Neck supple.  Lymphadenopathy:     Cervical: No cervical adenopathy.  Skin:    General: Skin is warm and dry.     Findings: No rash.  Neurological:     Mental Status: He is alert and oriented to person, place, and time.     Coordination: Coordination normal.  Psychiatric:        Behavior: Behavior normal.       Assessment & Plan:   Problem List Items Addressed This Visit      Cardiovascular and Mediastinum   Essential hypertension   Relevant Orders   CMP14+EGFR     Other   HLD (hyperlipidemia)   Pre-diabetes - Primary   Relevant Orders   Bayer DCA Hb A1c Waived   CMP14+EGFR   Hypertriglyceridemia      Will check blood work again today, sounds like patient is doing pretty good, dealing with the loss of his brother. Follow up plan: Return in about 6 months (around 04/04/2020), or if symptoms worsen or fail to improve, for Hypertension hyperlipidemia.  Counseling provided for all of the vaccine components No orders of the defined types were placed in this encounter.   Caryl Pina, MD [facility] 10/03/2019, 1:46 PM

## 2019-10-04 LAB — CMP14+EGFR
ALT: 16 IU/L (ref 0–44)
AST: 13 IU/L (ref 0–40)
Albumin/Globulin Ratio: 2.6 — ABNORMAL HIGH (ref 1.2–2.2)
Albumin: 4.5 g/dL (ref 3.7–4.7)
Alkaline Phosphatase: 95 IU/L (ref 48–121)
BUN/Creatinine Ratio: 17 (ref 10–24)
BUN: 23 mg/dL (ref 8–27)
Bilirubin Total: 0.5 mg/dL (ref 0.0–1.2)
CO2: 24 mmol/L (ref 20–29)
Calcium: 9.2 mg/dL (ref 8.6–10.2)
Chloride: 107 mmol/L — ABNORMAL HIGH (ref 96–106)
Creatinine, Ser: 1.36 mg/dL — ABNORMAL HIGH (ref 0.76–1.27)
GFR calc Af Amer: 58 mL/min/{1.73_m2} — ABNORMAL LOW (ref >59)
GFR calc non Af Amer: 51 mL/min/{1.73_m2} — ABNORMAL LOW (ref >59)
Globulin, Total: 1.7 g/dL (ref 1.5–4.5)
Glucose: 100 mg/dL — ABNORMAL HIGH (ref 65–99)
Potassium: 4.8 mmol/L (ref 3.5–5.2)
Sodium: 145 mmol/L — ABNORMAL HIGH (ref 134–144)
Total Protein: 6.2 g/dL (ref 6.0–8.5)

## 2019-10-14 ENCOUNTER — Telehealth: Payer: Self-pay | Admitting: Family Medicine

## 2019-10-14 NOTE — Telephone Encounter (Signed)
**Note De-identified [place] Obfuscation** Patient aware of lab results.

## 2019-10-31 IMAGING — US US RENAL
1 series · 14 of 25 positions shown · non-contrast
Comparison: None

CLINICAL DATA: Stage III chronic kidney disease, history
hypertension

EXAM:
RENAL / URINARY TRACT ULTRASOUND COMPLETE

[Series 1: us renal · 14 of 52 slices shown]
[im 1/52]
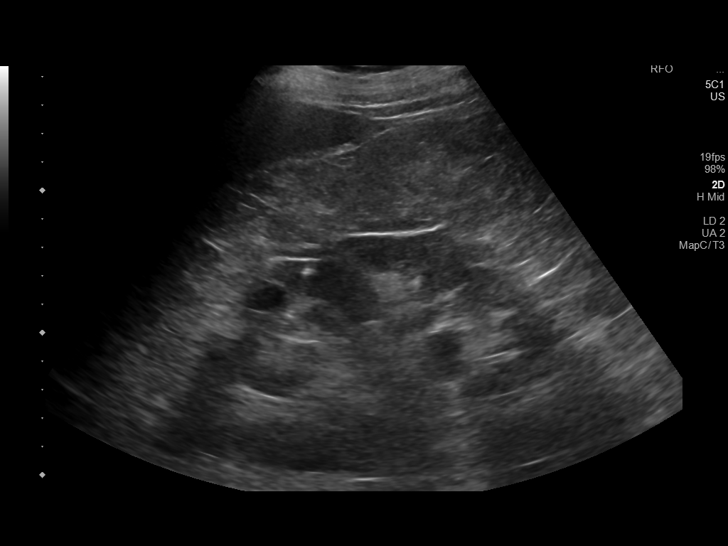
[im 5/52]
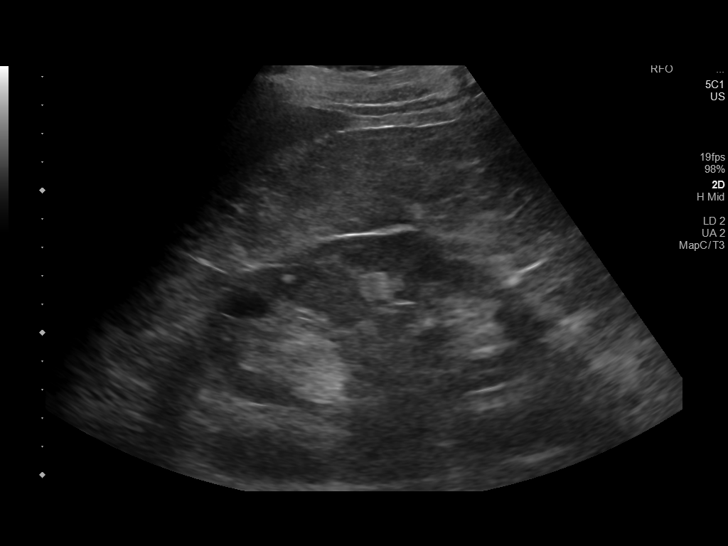
[im 9/52]
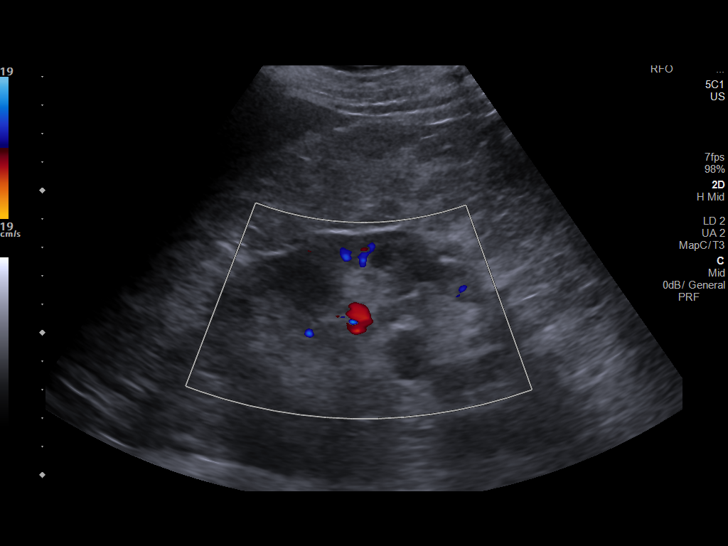
[im 13/52]
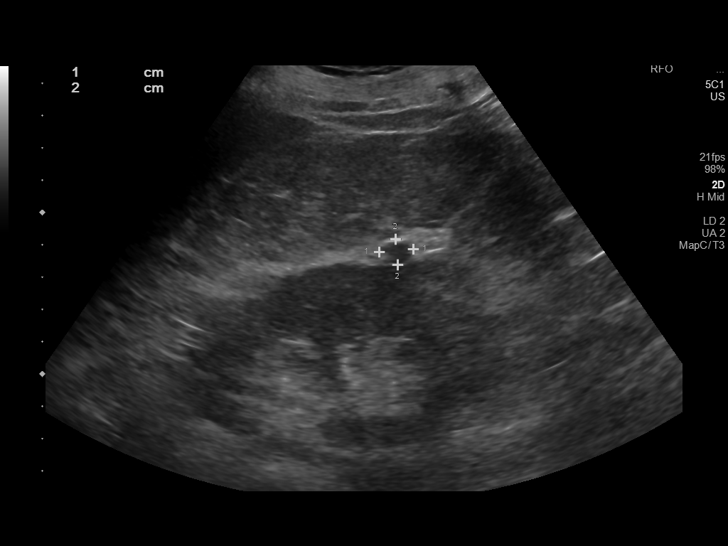
[im 18/52]
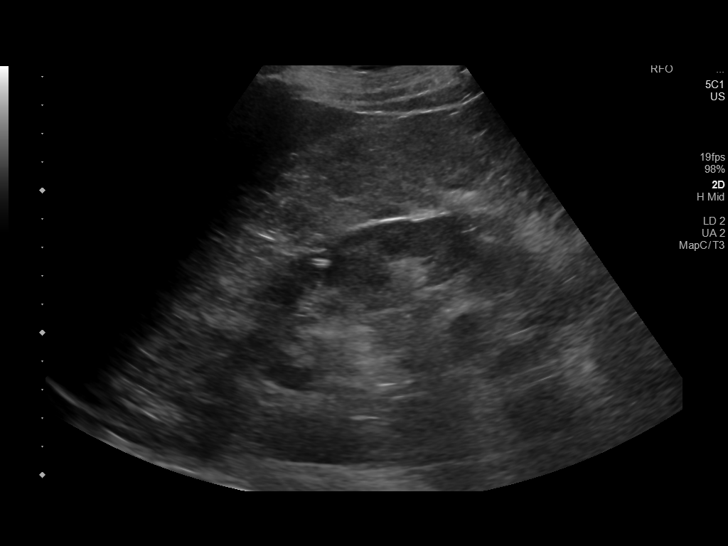
[im 20/52]
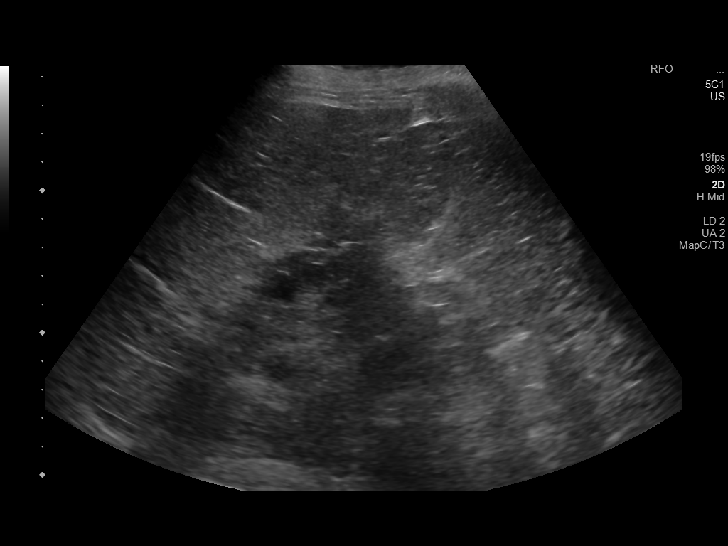
[im 24/52]
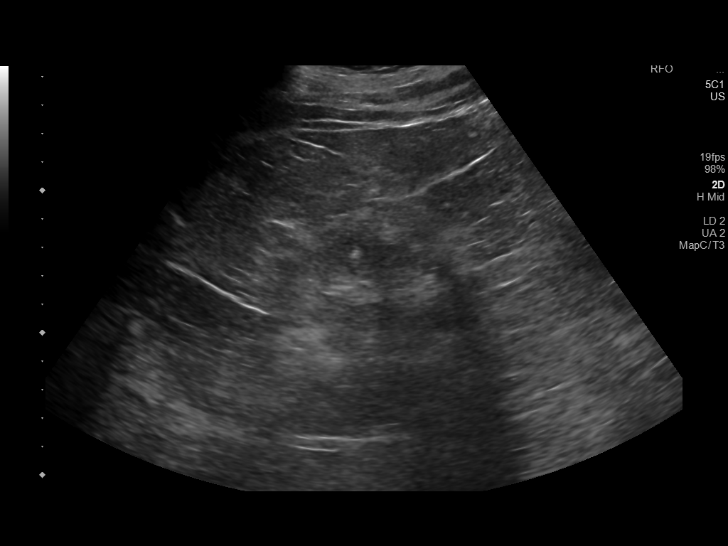
[im 28/52]
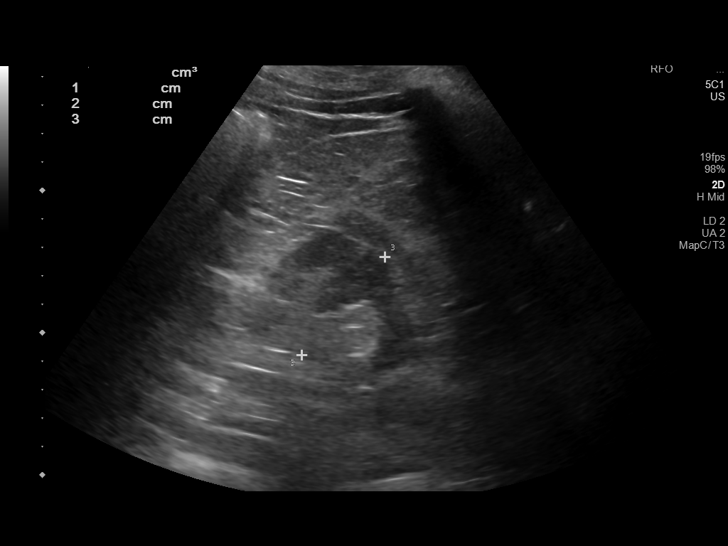
[im 32/52]
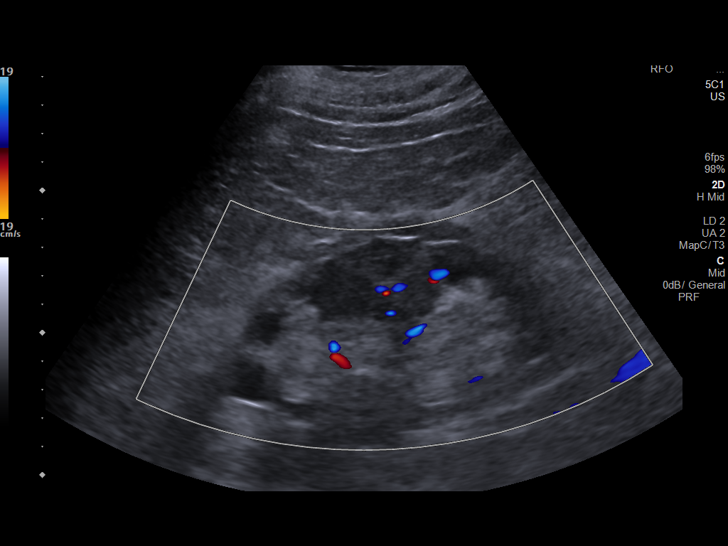
[im 35/52]
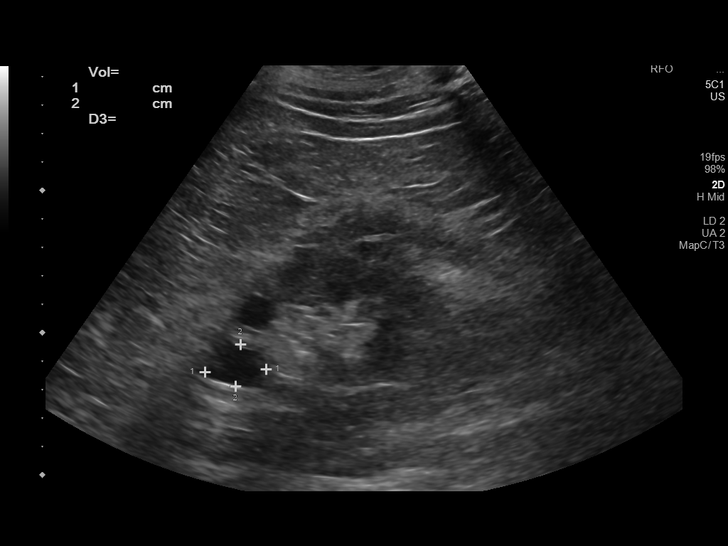
[im 39/52]
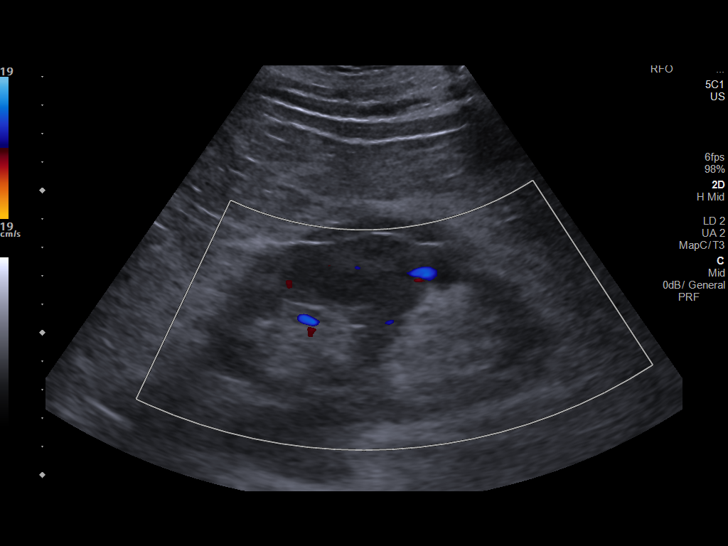
[im 43/52]
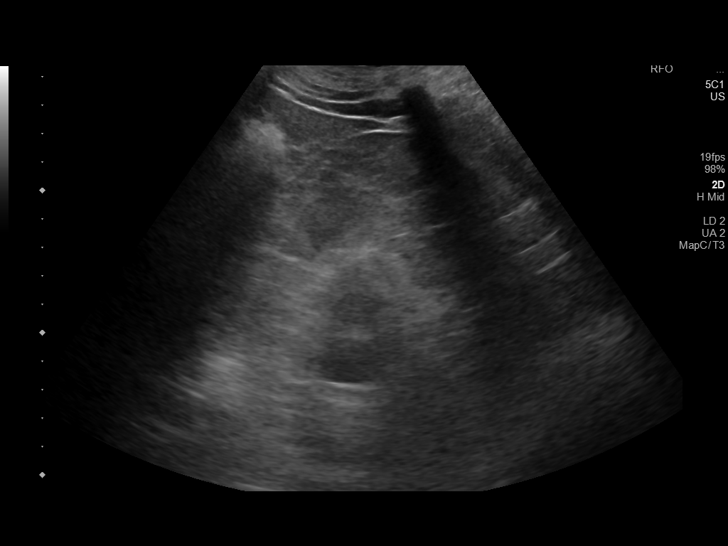
[im 47/52]
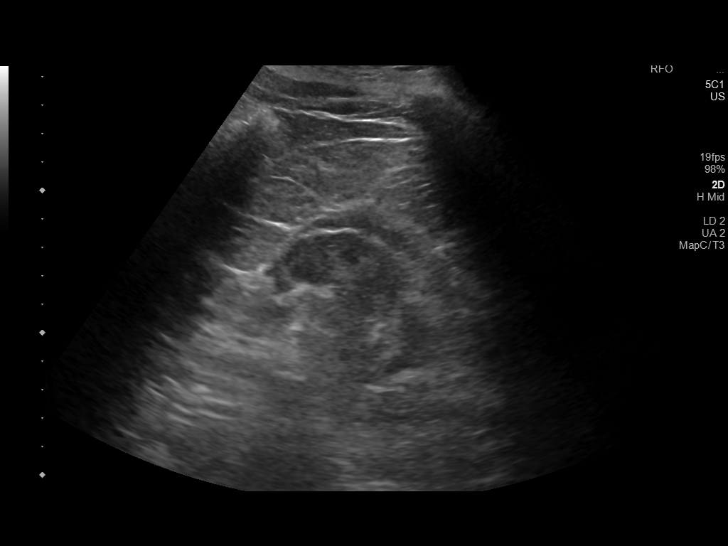
[im 52/52]
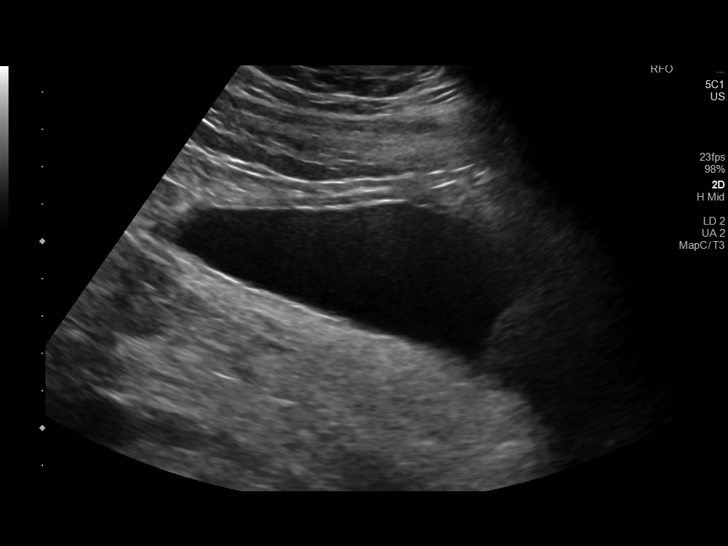

[14 of 25 positions shown; findings below may reference images not displayed]

FINDINGS: Right Kidney:

Renal measurements: 12.1 x 6.1 x 5.6 cm = volume: 218.2 mL. Cortical
thinning. Increased cortical echogenicity. Small cyst at upper pole
16 x 14 x 11 mm. Exophytic cyst at mid kidney 14 x 8 x 8 mm. 4 mm
nonshadowing echogenic focus at upper pole could represent a small
calculus. No additional mass or hydronephrosis.

Left Kidney:

Renal measurements: 11.8 x 6.3 x 4.5 cm = volume: 176.6 mL. Cortical
thinning. Minimally increased cortical echogenicity. Small cyst at
upper pole 15 x 13 x 15 mm. Additional small cyst at upper pole 22 x
15 x 18 mm. No additional mass, hydronephrosis or shadowing
calcification. Ureteral jets

Bladder:

Appears normal for degree of bladder distention. Were not visualized
during imaging.
IMPRESSION: Medical renal disease changes of both kidneys.

Small BILATERAL renal cysts.

Question 4 mm nonobstructing calculus at upper pole RIGHT kidney.

## 2019-12-02 ENCOUNTER — Other Ambulatory Visit: Payer: Self-pay

## 2019-12-02 ENCOUNTER — Other Ambulatory Visit: Payer: BLUE CROSS/BLUE SHIELD

## 2019-12-02 DIAGNOSIS — E559 Vitamin D deficiency, unspecified: Secondary | ICD-10-CM | POA: Diagnosis not present

## 2019-12-02 DIAGNOSIS — D696 Thrombocytopenia, unspecified: Secondary | ICD-10-CM | POA: Diagnosis not present

## 2019-12-02 DIAGNOSIS — I129 Hypertensive chronic kidney disease with stage 1 through stage 4 chronic kidney disease, or unspecified chronic kidney disease: Secondary | ICD-10-CM | POA: Diagnosis not present

## 2019-12-02 DIAGNOSIS — E870 Hyperosmolality and hypernatremia: Secondary | ICD-10-CM | POA: Diagnosis not present

## 2019-12-06 DIAGNOSIS — E870 Hyperosmolality and hypernatremia: Secondary | ICD-10-CM | POA: Diagnosis not present

## 2019-12-06 DIAGNOSIS — E559 Vitamin D deficiency, unspecified: Secondary | ICD-10-CM | POA: Diagnosis not present

## 2019-12-06 DIAGNOSIS — D696 Thrombocytopenia, unspecified: Secondary | ICD-10-CM | POA: Diagnosis not present

## 2019-12-06 DIAGNOSIS — I129 Hypertensive chronic kidney disease with stage 1 through stage 4 chronic kidney disease, or unspecified chronic kidney disease: Secondary | ICD-10-CM | POA: Diagnosis not present

## 2019-12-27 ENCOUNTER — Other Ambulatory Visit: Payer: Self-pay | Admitting: Family Medicine

## 2019-12-27 DIAGNOSIS — R972 Elevated prostate specific antigen [PSA]: Secondary | ICD-10-CM

## 2019-12-27 DIAGNOSIS — N400 Enlarged prostate without lower urinary tract symptoms: Secondary | ICD-10-CM

## 2020-01-27 ENCOUNTER — Other Ambulatory Visit: Payer: Self-pay

## 2020-01-27 ENCOUNTER — Ambulatory Visit (INDEPENDENT_AMBULATORY_CARE_PROVIDER_SITE_OTHER): Payer: BLUE CROSS/BLUE SHIELD | Admitting: Ophthalmology

## 2020-01-27 ENCOUNTER — Encounter (INDEPENDENT_AMBULATORY_CARE_PROVIDER_SITE_OTHER): Payer: Self-pay | Admitting: Ophthalmology

## 2020-01-27 DIAGNOSIS — H35371 Puckering of macula, right eye: Secondary | ICD-10-CM | POA: Diagnosis not present

## 2020-01-27 DIAGNOSIS — H35372 Puckering of macula, left eye: Secondary | ICD-10-CM

## 2020-01-27 DIAGNOSIS — H15833 Staphyloma posticum, bilateral: Secondary | ICD-10-CM

## 2020-01-27 DIAGNOSIS — H353123 Nonexudative age-related macular degeneration, left eye, advanced atrophic without subfoveal involvement: Secondary | ICD-10-CM

## 2020-01-27 DIAGNOSIS — H353114 Nonexudative age-related macular degeneration, right eye, advanced atrophic with subfoveal involvement: Secondary | ICD-10-CM | POA: Diagnosis not present

## 2020-01-27 DIAGNOSIS — H353221 Exudative age-related macular degeneration, left eye, with active choroidal neovascularization: Secondary | ICD-10-CM | POA: Insufficient documentation

## 2020-01-27 DIAGNOSIS — H3530 Unspecified macular degeneration: Secondary | ICD-10-CM

## 2020-01-27 NOTE — Assessment & Plan Note (Signed)
**Note De-Identified [place] Obfuscation** History of CNVM in the distant past, over 12 years previous

## 2020-01-27 NOTE — Assessment & Plan Note (Signed)
**Note De-Identified Carrol Obfuscation** OD, central foveal atrophy accounts for acuity, no active CNVM

## 2020-01-27 NOTE — Assessment & Plan Note (Addendum)
**Note De-Identified Adkins Obfuscation** The nature of wet macular degeneration was discussed with the patient.  Forms of therapy reviewed include the use of Anti-VEGF medications injected painlessly into the eye, as well as other possible treatment modalities, including thermal laser therapy. Fellow eye involvement and risks were discussed with the patient. Upon the finding of wet age related macular degeneration, treatment will be offered. The treatment regimen is on a treat as needed basis with the intent to treat if necessary and extend interval of exams when possible. On average 1 out of 6 patients do not need lifetime therapy. However, the risk of recurrent disease is high for a lifetime.  Initially monthly, then periodic, examinations and evaluations will determine whether the next treatment is required on the day of the examination.  New foci of CNVM OS, 1 is nasal and inferior to the fovea and the second is superior to the fovea.  We will commence therapy promptly to prevent progression of CNVM particular attention to the lesion nasal to the fovea

## 2020-01-27 NOTE — Progress Notes (Signed)
**Note De-Identified [place]i Obfuscation** 01/27/2020     CHIEF COMPLAINT Patient presents for Retina Follow Up   HISTORY OF PRESENT ILLNESS: Daniel Brock is a 74 y.o. male who presents to the clinic today for:   HPI    Retina Follow Up    Patient presents with  Dry AMD.  In both eyes.  This started 3.  Severity is mild.  Duration of 3 years.  Since onset it is gradually worsening.          Comments    3 Year AMD F/U OU  Pt c/o gradually decreased VA OU, especially over the past 5 months. Pt c/o difficulty with depth perception. Pt c/o difficulty driving over the past 5 months.       Last edited by Rockie Neighbours, [place] on 01/27/2020  2:18 PM. (History)      Referring physician: Dettinger, Fransisca Kaufmann, MD [place],  [place] [id number]  HISTORICAL INFORMATION:   Selected notes from the MEDICAL RECORD NUMBER    Lab Results  Component Value Date   HGBA1C 5.2 10/03/2019     CURRENT MEDICATIONS: No current outpatient medications on file. (Ophthalmic Drugs)   No current facility-administered medications for this visit. (Ophthalmic Drugs)   Current Outpatient Medications (Other)  Medication Sig  . allopurinol (ZYLOPRIM) 100 MG tablet Take 1 tablet (100 mg total) by mouth 2 (two) times daily. (Patient taking differently: Take 100 mg by mouth 2 (two) times daily as needed. )  . colchicine 0.6 MG tablet Take 1 tablet (0.6 mg total) by mouth daily. May use up to 2 a day on the initial day (Patient taking differently: Take 0.6 mg by mouth as needed. May use up to 2 a day on the initial day)  . cyclobenzaprine (FLEXERIL) 10 MG tablet Take 1 tablet (10 mg total) by mouth 3 (three) times daily as needed for muscle spasms.  Marland Kitchen OVER THE COUNTER MEDICATION Eye vitamin  . rosuvastatin (CRESTOR) 20 MG tablet Take 1 tablet (20 mg total) by mouth daily.  . sertraline (ZOLOFT) 100 MG tablet Take 0.5 tablets (50 mg total) by mouth daily.  . tamsulosin (FLOMAX) 0.4 MG CAPS capsule Take 2 capsules (0.8 mg total) by mouth  daily.   No current facility-administered medications for this visit. (Other)      REVIEW OF SYSTEMS:    ALLERGIES Allergies  Allergen Reactions  . Ciprofloxacin Nausea And Vomiting  . Ciprofloxacin Hcl     REACTION: NAUSIA AND VOMITING  . Sulfamethoxazole-Trimethoprim     REACTION: nausea and vomiting  . Sulfonamide Derivatives     REACTION: itching  . Sulphur [Sulfur] Rash    PAST MEDICAL HISTORY Past Medical History:  Diagnosis Date  . Allergy   . Anxiety   . Cataract   . Chronic foot pain   . Chronic kidney disease   . Chronic leg pain   . Depression 2016  . Gout   . Hiatal hernia   . Hyperlipidemia   . Hypertension   . Renal insufficiency   . Varicose veins    Past Surgical History:  Procedure Laterality Date  . CARPAL TUNNEL RELEASE Left   . CARPAL TUNNEL RELEASE Bilateral   . CHOLECYSTECTOMY  2026  . COLONOSCOPY    . COLONOSCOPY W/ POLYPECTOMY    . EYE SURGERY Bilateral 1958, 2004   cataracts  . INGUINAL HERNIA REPAIR    . JOINT REPLACEMENT  2002  . TOTAL KNEE ARTHROPLASTY Right 2002  . ulnar **Note De-Identified [place] Obfuscation** intrapment release  07/2008  . VARICOSE VEIN SURGERY Left 1980's per patient   Dr. Maurene Capes    FAMILY HISTORY Family History  Problem Relation Age of Onset  . Hypertension Mother   . Hyperlipidemia Mother   . Arthritis Mother   . Heart disease Mother   . Huntington's disease Mother   . Cancer Father        pancreatic cancer  . Arthritis Father   . Diabetes Father   . Hypertension Father   . Hyperlipidemia Sister   . Hypertension Sister   . Hypertension Brother   . Vision loss Brother   . Huntington's disease Brother   . Heart disease Maternal Grandmother   . Early death Maternal Grandfather   . Colon cancer Neg Hx   . Esophageal cancer Neg Hx   . Rectal cancer Neg Hx   . Stomach cancer Neg Hx     SOCIAL HISTORY Social History   Tobacco Use  . Smoking status: Never Smoker  . Smokeless tobacco: Never Used  Vaping Use  . Vaping Use: Never  used  Substance Use Topics  . Alcohol use: No  . Drug use: No         OPHTHALMIC EXAM: Base Eye Exam    Visual Acuity (ETDRS)      Right Left   Dist [place] CF at 3' 20/80 +2   Dist ph [place] 20/400ecc 20/50 -1  Pt did not bring glasses       Tonometry (Tonopen, 2:18 PM)      Right Left   Pressure 13 08       Pupils      Pupils Dark Light Shape React APD   Right PERRL 4 3 Round Brisk None   Left PERRL 4 3 Round Brisk None       Visual Fields (Counting fingers)      Left Right    Full Full       Extraocular Movement      Right Left    Nystagmus Nystagmus    -- -- --  --  --  -- -- --   -- -- --  --  --  -- -- --    Esotropia OS       Neuro/Psych    Oriented x3: Yes   Mood/Affect: Normal       Dilation    Both eyes: 1.0% Mydriacyl, 2.5% Phenylephrine @ 2:25 PM        Slit Lamp and Fundus Exam    Fundus Exam      Right Left   Posterior Vitreous Posterior vitreous detachment Posterior vitreous detachment   Disc Peripapillary atrophy,            IMAGING AND PROCEDURES  Imaging and Procedures for 01/27/20  OCT, Retina - OU - Both Eyes       Right Eye Scan locations included subfoveal. Central Foveal Thickness: 216. Progression has been stable.   Left Eye Central Foveal Thickness: 403. Progression has worsened. Findings include subretinal fluid, choroidal neovascular membrane, epiretinal membrane.   Notes OD with central scarring from dry ARMD, myopic macular degeneration, increase atrophy over the last 3 years from prior OCT evaluation  OS with new CNVM inferonasal and a second lesion superior to the fovea, incidental epiretinal membrane noted left eye, nonfoveal distorting                ASSESSMENT/PLAN:  Myopic macular degeneration of both eyes History of CNVM in the distant **Note De-Identified [place] Obfuscation** past, over 12 years previous  Exudative age-related macular degeneration of left eye with active choroidal neovascularization ([place]) The nature of wet macular  degeneration was discussed with the patient.  Forms of therapy reviewed include the use of Anti-VEGF medications injected painlessly into the eye, as well as other possible treatment modalities, including thermal laser therapy. Fellow eye involvement and risks were discussed with the patient. Upon the finding of wet age related macular degeneration, treatment will be offered. The treatment regimen is on a treat as needed basis with the intent to treat if necessary and extend interval of exams when possible. On average 1 out of 6 patients do not need lifetime therapy. However, the risk of recurrent disease is high for a lifetime.  Initially monthly, then periodic, examinations and evaluations will determine whether the next treatment is required on the day of the examination.  New foci of CNVM OS, 1 is nasal and inferior to the fovea and the second is superior to the fovea.  We will commence therapy promptly to prevent progression of CNVM particular attention to the lesion nasal to the fovea  Left epiretinal membrane The nature of macular pucker (epiretinal membrane ERM) was discussed with the patient as well as threshold criteria for vitrectomy surgery. I explained that in rare cases another surgery is needed to actually remove a second wrinkle should it regrow.  Most often, the epiretinal membrane and underlying wrinkled internal limiting membrane are removed with the first surgery, to accomplish the goals.   If the operative eye is [place] (natural lens still present), cataract surgery is often recommended prior to Vitrectomy. This will enable the retina surgeon to have the best view during surgery and the patient to obtain optimal results in the future. Treatment options were discussed.  Minor at this time  Advanced nonexudative age-related macular degeneration of right eye with subfoveal involvement OD, central foveal atrophy accounts for acuity, no active CNVM      ICD-10-CM   1. Exudative  age-related macular degeneration of left eye with active choroidal neovascularization (HCC)  H35.3221 OCT, Retina - OU - Both Eyes  2. Advanced nonexudative age-related macular degeneration of right eye with subfoveal involvement  H35.3114 OCT, Retina - OU - Both Eyes  3. Advanced nonexudative age-related macular degeneration of left eye without subfoveal involvement  H35.3123   4. Right epiretinal membrane  H35.371   5. Myopic macular degeneration of both eyes  H35.30   6. 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 Left epiretinal membrane  H35.372     1.  2.  3.  Ophthalmic Meds Ordered this visit:  No orders of the defined types were placed in this encounter.      Return in about 1 week (around 02/03/2020) for dilate, OS, AVASTIN OCT.  There are no Patient Instructions on file for this visit.   Explained the diagnoses, plan, and follow up with the patient and they expressed understanding.  Patient expressed understanding of the importance of proper follow up care.   Clent Demark Rainee Sweatt M.D. Diseases & Surgery of the Retina and Vitreous Retina [facility] Diabetic [place] 01/27/20     Abbreviations: M myopia (nearsighted); A astigmatism; H hyperopia (farsighted); P presbyopia; Mrx spectacle prescription;  CTL contact lenses; OD right eye; OS left eye; OU both eyes  XT exotropia; ET esotropia; PEK punctate epithelial keratitis; PEE punctate epithelial erosions; DES dry eye syndrome; MGD meibomian gland dysfunction; ATs artificial tears; PFAT's preservative free artificial tears; [place] nuclear sclerotic **Note De-Identified Kr[place] Obfuscation** cataract; PSC posterior subcapsular cataract; ERM epi-retinal membrane; PVD posterior vitreous detachment; RD retinal detachment; DM diabetes mellitus; DR diabetic retinopathy; NPDR non-proliferative diabetic retinopathy; PDR proliferative diabetic retinopathy; CSME clinically significant macular edema; DME diabetic macular edema; dbh dot blot hemorrhages; CWS cotton wool spot; POAG primary  open angle glaucoma; C/D cup-to-disc ratio; HVF humphrey visual field; GVF goldmann visual field; OCT optical coherence tomography; IOP intraocular pressure; BRVO Branch retinal vein occlusion; CRVO central retinal vein occlusion; CRAO central retinal artery occlusion; BRAO branch retinal artery occlusion; RT retinal tear; SB scleral buckle; PPV pars plana vitrectomy; VH Vitreous hemorrhage; PRP panretinal laser photocoagulation; IVK intravitreal kenalog; VMT vitreomacular traction; MH Macular hole;  NVD neovascularization of the disc; NVE neovascularization elsewhere; AREDS age related eye disease study; ARMD age related macular degeneration; POAG primary open angle glaucoma; EBMD epithelial/anterior basement membrane dystrophy; ACIOL anterior chamber intraocular lens; IOL intraocular lens; PCIOL posterior chamber intraocular lens; Phaco/IOL phacoemulsification with intraocular lens placement; [place] photorefractive keratectomy; LASIK laser assisted in situ keratomileusis; HTN hypertension; DM diabetes mellitus; COPD chronic obstructive pulmonary disease

## 2020-01-27 NOTE — Assessment & Plan Note (Signed)
**Note De-Identified [place] Obfuscation** The nature of macular pucker (epiretinal membrane ERM) was discussed with the patient as well as threshold criteria for vitrectomy surgery. I explained that in rare cases another surgery is needed to actually remove a second wrinkle should it regrow.  Most often, the epiretinal membrane and underlying wrinkled internal limiting membrane are removed with the first surgery, to accomplish the goals.   If the operative eye is [place] (natural lens still present), cataract surgery is often recommended prior to Vitrectomy. This will enable the retina surgeon to have the best view during surgery and the patient to obtain optimal results in the future. Treatment options were discussed.  Minor at this time

## 2020-02-06 ENCOUNTER — Other Ambulatory Visit: Payer: Self-pay

## 2020-02-06 ENCOUNTER — Encounter (INDEPENDENT_AMBULATORY_CARE_PROVIDER_SITE_OTHER): Payer: Self-pay | Admitting: Ophthalmology

## 2020-02-06 ENCOUNTER — Ambulatory Visit (INDEPENDENT_AMBULATORY_CARE_PROVIDER_SITE_OTHER): Payer: BLUE CROSS/BLUE SHIELD | Admitting: Ophthalmology

## 2020-02-06 DIAGNOSIS — H353221 Exudative age-related macular degeneration, left eye, with active choroidal neovascularization: Secondary | ICD-10-CM | POA: Diagnosis not present

## 2020-02-06 DIAGNOSIS — H353123 Nonexudative age-related macular degeneration, left eye, advanced atrophic without subfoveal involvement: Secondary | ICD-10-CM

## 2020-02-06 MED ORDER — BEVACIZUMAB CHEMO INJECTION 1.25MG/0.05ML SYRINGE FOR KALEIDOSCOPE
1.2500 mg | INTRAVITREAL | Status: AC | PRN
  Administered 2020-02-06: 1.25 mg via INTRAVITREAL

## 2020-02-06 NOTE — Assessment & Plan Note (Signed)
**Note De-Identified Wol[place] Obfuscation** CNVM nasal to FAZ as well as temporal.  With intraretinal fluid, will treat today with intravitreal antivegF and examination repeated in 7 weeks to minimize scotoma enlargement from active disease

## 2020-02-06 NOTE — Progress Notes (Signed)
**Note De-Identified [place] Obfuscation** 02/06/2020     CHIEF COMPLAINT Patient presents for Retina Follow Up   HISTORY OF PRESENT ILLNESS: Daniel Brock is a 74 y.o. male who presents to the clinic today for:   HPI    Retina Follow Up    Patient presents with  Wet AMD.  In left eye.  This started 1 week ago.  Duration of 1 week.  Since onset it is stable.          Comments    1 WK F/U OS, POSS AVASTIN    Pt reports stable vision, no F/F        Last edited by Nichola Sizer D on 02/06/2020  1:25 PM. (History)      Referring physician: Dettinger, Fransisca Kaufmann, MD [place],  [place] [id number]  HISTORICAL INFORMATION:   Selected notes from the MEDICAL RECORD NUMBER    Lab Results  Component Value Date   HGBA1C 5.2 10/03/2019     CURRENT MEDICATIONS: No current outpatient medications on file. (Ophthalmic Drugs)   No current facility-administered medications for this visit. (Ophthalmic Drugs)   Current Outpatient Medications (Other)  Medication Sig  . allopurinol (ZYLOPRIM) 100 MG tablet Take 1 tablet (100 mg total) by mouth 2 (two) times daily. (Patient taking differently: Take 100 mg by mouth 2 (two) times daily as needed. )  . colchicine 0.6 MG tablet Take 1 tablet (0.6 mg total) by mouth daily. May use up to 2 a day on the initial day (Patient taking differently: Take 0.6 mg by mouth as needed. May use up to 2 a day on the initial day)  . cyclobenzaprine (FLEXERIL) 10 MG tablet Take 1 tablet (10 mg total) by mouth 3 (three) times daily as needed for muscle spasms.  Marland Kitchen OVER THE COUNTER MEDICATION Eye vitamin  . rosuvastatin (CRESTOR) 20 MG tablet Take 1 tablet (20 mg total) by mouth daily.  . sertraline (ZOLOFT) 100 MG tablet Take 0.5 tablets (50 mg total) by mouth daily.  . tamsulosin (FLOMAX) 0.4 MG CAPS capsule Take 2 capsules (0.8 mg total) by mouth daily.   No current facility-administered medications for this visit. (Other)      REVIEW OF SYSTEMS:    ALLERGIES Allergies    Allergen Reactions  . Ciprofloxacin Nausea And Vomiting  . Ciprofloxacin Hcl     REACTION: NAUSIA AND VOMITING  . Sulfamethoxazole-Trimethoprim     REACTION: nausea and vomiting  . Sulfonamide Derivatives     REACTION: itching  . Sulphur [Sulfur] Rash    PAST MEDICAL HISTORY Past Medical History:  Diagnosis Date  . Allergy   . Anxiety   . Cataract   . Chronic foot pain   . Chronic kidney disease   . Chronic leg pain   . Depression 2016  . Gout   . Hiatal hernia   . Hyperlipidemia   . Hypertension   . Renal insufficiency   . Varicose veins    Past Surgical History:  Procedure Laterality Date  . CARPAL TUNNEL RELEASE Left   . CARPAL TUNNEL RELEASE Bilateral   . CHOLECYSTECTOMY  2026  . COLONOSCOPY    . COLONOSCOPY W/ POLYPECTOMY    . EYE SURGERY Bilateral 1958, 2004   cataracts  . INGUINAL HERNIA REPAIR    . JOINT REPLACEMENT  2002  . TOTAL KNEE ARTHROPLASTY Right 2002  . ulnar intrapment release  07/2008  . VARICOSE VEIN SURGERY Left 1980's per patient   Dr. Maurene Capes **Note De-Identified Ka[place] Obfuscation** FAMILY HISTORY Family History  Problem Relation Age of Onset  . Hypertension Mother   . Hyperlipidemia Mother   . Arthritis Mother   . Heart disease Mother   . Huntington's disease Mother   . Cancer Father        pancreatic cancer  . Arthritis Father   . Diabetes Father   . Hypertension Father   . Hyperlipidemia Sister   . Hypertension Sister   . Hypertension Brother   . Vision loss Brother   . Huntington's disease Brother   . Heart disease Maternal Grandmother   . Early death Maternal Grandfather   . Colon cancer Neg Hx   . Esophageal cancer Neg Hx   . Rectal cancer Neg Hx   . Stomach cancer Neg Hx     SOCIAL HISTORY Social History   Tobacco Use  . Smoking status: Never Smoker  . Smokeless tobacco: Never Used  Vaping Use  . Vaping Use: Never used  Substance Use Topics  . Alcohol use: No  . Drug use: No         OPHTHALMIC EXAM: Base Eye Exam    Visual Acuity (ETDRS)       Right Left   Dist [place] CF at 4' 20/200 -1   Dist ph [place] 20/200 20/100 -2       Tonometry (Tonopen, 1:29 PM)      Right Left   Pressure 18 13       Pupils      Pupils Dark Light Shape React APD   Right PERRL 4 3 Round Brisk None   Left PERRL 4 3 Round Brisk None       Visual Fields (Counting fingers)      Left Right    Full Full       Extraocular Movement      Right Left    Full Full       Neuro/Psych    Oriented x3: Yes   Mood/Affect: Normal       Dilation    Left eye: 1.0% Mydriacyl, 2.5% Phenylephrine @ 1:31 PM        Slit Lamp and Fundus Exam    External Exam      Right Left   External Normal Normal       Slit Lamp Exam      Right Left   Lids/Lashes Normal Normal   Conjunctiva/Sclera White and quiet White and quiet   Cornea Clear Clear   Anterior Chamber Deep and quiet Deep and quiet   [place]ris Round and reactive Round and reactive   Lens  Centered posterior chamber intraocular lens   Anterior Vitreous Normal Normal       Fundus Exam      Right Left   Posterior Vitreous Posterior vitreous detachment Posterior vitreous detachment   Disc Peripapillary atrophy,  Peripapillary atrophy,    C/D Ratio  0.55   Macula  Macular thickening, Subretinal neovascular membrane, Subretinal fibrosis, no hemorrhage, Hard drusen, Retinal pigment epithelial atrophy - geographic   Vessels  Normal   Periphery  Normal          IMAGING AND PROCEDURES  Imaging and Procedures for 02/06/20           ASSESSMENT/PLAN:  Exudative age-related macular degeneration of left eye with active choroidal neovascularization (HCC) CNVM nasal to FAZ as well as temporal.  With intraretinal fluid, will treat today with intravitreal antivegF and examination repeated in 7 weeks to minimize scotoma enlargement from **Note De-Identified [place] Obfuscation** active disease      ICD-10-CM   1. Advanced nonexudative age-related macular degeneration of left eye without subfoveal involvement  H35.3123 CANCELED: OCT, Retina - OU  - Both Eyes  2. Exudative age-related macular degeneration of left eye with active choroidal neovascularization ([place])  H35.3221     1.  Commence with antivegF, intravitreal Avastin OS today  2.  Dilate OS next  3.  Ophthalmic Meds Ordered this visit:  No orders of the defined types were placed in this encounter.      Return in about 7 weeks (around 03/26/2020) for dilate, OS, AVASTIN OCT.  There are no Patient Instructions on file for this visit.   Explained the diagnoses, plan, and follow up with the patient and they expressed understanding.  Patient expressed understanding of the importance of proper follow up care.   Clent Demark Lyon Dumont M.D. Diseases & Surgery of the Retina and Vitreous Retina & Diabetic [place] 02/06/20     Abbreviations: M myopia (nearsighted); A astigmatism; H hyperopia (farsighted); P presbyopia; Mrx spectacle prescription;  CTL contact lenses; OD right eye; OS left eye; OU both eyes  XT exotropia; ET esotropia; PEK punctate epithelial keratitis; PEE punctate epithelial erosions; DES dry eye syndrome; MGD meibomian gland dysfunction; ATs artificial tears; PFAT's preservative free artificial tears; [place] nuclear sclerotic cataract; PSC posterior subcapsular cataract; ERM epi-retinal membrane; PVD posterior vitreous detachment; RD retinal detachment; DM diabetes mellitus; DR diabetic retinopathy; NPDR non-proliferative diabetic retinopathy; PDR proliferative diabetic retinopathy; CSME clinically significant macular edema; DME diabetic macular edema; dbh dot blot hemorrhages; CWS cotton wool spot; POAG primary open angle glaucoma; C/D cup-to-disc ratio; HVF humphrey visual field; GVF goldmann visual field; OCT optical coherence tomography; IOP intraocular pressure; BRVO Branch retinal vein occlusion; CRVO central retinal vein occlusion; CRAO central retinal artery occlusion; BRAO branch retinal artery occlusion; RT retinal tear; SB scleral buckle; PPV pars plana  vitrectomy; VH Vitreous hemorrhage; PRP panretinal laser photocoagulation; IVK intravitreal kenalog; VMT vitreomacular traction; MH Macular hole;  NVD neovascularization of the disc; NVE neovascularization elsewhere; AREDS age related eye disease study; ARMD age related macular degeneration; POAG primary open angle glaucoma; EBMD epithelial/anterior basement membrane dystrophy; ACIOL anterior chamber intraocular lens; IOL intraocular lens; PCIOL posterior chamber intraocular lens; Phaco/IOL phacoemulsification with intraocular lens placement; [place] photorefractive keratectomy; LASIK laser assisted in situ keratomileusis; HTN hypertension; DM diabetes mellitus; COPD chronic obstructive pulmonary disease

## 2020-02-27 ENCOUNTER — Other Ambulatory Visit: Payer: Self-pay

## 2020-02-27 ENCOUNTER — Other Ambulatory Visit: Payer: BLUE CROSS/BLUE SHIELD

## 2020-02-27 DIAGNOSIS — E559 Vitamin D deficiency, unspecified: Secondary | ICD-10-CM | POA: Diagnosis not present

## 2020-02-27 DIAGNOSIS — I129 Hypertensive chronic kidney disease with stage 1 through stage 4 chronic kidney disease, or unspecified chronic kidney disease: Secondary | ICD-10-CM | POA: Diagnosis not present

## 2020-02-27 DIAGNOSIS — E870 Hyperosmolality and hypernatremia: Secondary | ICD-10-CM | POA: Diagnosis not present

## 2020-02-27 DIAGNOSIS — D696 Thrombocytopenia, unspecified: Secondary | ICD-10-CM | POA: Diagnosis not present

## 2020-03-06 DIAGNOSIS — E559 Vitamin D deficiency, unspecified: Secondary | ICD-10-CM | POA: Diagnosis not present

## 2020-03-06 DIAGNOSIS — E870 Hyperosmolality and hypernatremia: Secondary | ICD-10-CM | POA: Diagnosis not present

## 2020-03-06 DIAGNOSIS — I129 Hypertensive chronic kidney disease with stage 1 through stage 4 chronic kidney disease, or unspecified chronic kidney disease: Secondary | ICD-10-CM | POA: Diagnosis not present

## 2020-03-06 DIAGNOSIS — D696 Thrombocytopenia, unspecified: Secondary | ICD-10-CM | POA: Diagnosis not present

## 2020-03-19 ENCOUNTER — Other Ambulatory Visit: Payer: Self-pay

## 2020-03-19 ENCOUNTER — Encounter (INDEPENDENT_AMBULATORY_CARE_PROVIDER_SITE_OTHER): Payer: Self-pay | Admitting: Ophthalmology

## 2020-03-19 ENCOUNTER — Ambulatory Visit (INDEPENDENT_AMBULATORY_CARE_PROVIDER_SITE_OTHER): Payer: BLUE CROSS/BLUE SHIELD | Admitting: Ophthalmology

## 2020-03-19 DIAGNOSIS — H353221 Exudative age-related macular degeneration, left eye, with active choroidal neovascularization: Secondary | ICD-10-CM | POA: Diagnosis not present

## 2020-03-19 MED ORDER — BEVACIZUMAB 2.5 MG/0.1ML IZ SOSY
2.5000 mg | PREFILLED_SYRINGE | INTRAVITREAL | Status: AC | PRN
  Administered 2020-03-19: 2.5 mg via INTRAVITREAL

## 2020-03-19 NOTE — Assessment & Plan Note (Signed)
**Note De-Identified [place] Obfuscation** Less active CNVM OS, will repeat injection Avastin today

## 2020-03-19 NOTE — Progress Notes (Signed)
**Note De-Identified [place] [facility]** 03/19/2020     CHIEF COMPLAINT Patient presents for Retina Follow Up (6 Week AMD F/U OS, poss Avastin OS//Pt c/o pain following injection last visit x 2 days, and sts he had to take ibuprofen. Pt sts [facility] overall stable since last visit.)   HISTORY OF PRESENT ILLNESS: Daniel Brock is a 74 y.o. male who presents to the clinic today for:   HPI    Retina Follow Up    Patient presents with  Wet AMD.  In left eye.  This started 6 weeks ago.  Severity is mild.  Duration of 6 weeks.  Since onset it is stable. Additional comments: 6 Week AMD F/U OS, poss Avastin OS  Pt c/o pain following injection last visit x 2 days, and sts he had to take ibuprofen. Pt sts [facility] overall stable since last visit.       Last edited by Rockie Neighbours, [place] on 03/19/2020  1:56 PM. (History)      Referring physician: Dettinger, Fransisca Kaufmann, MD [place],  [place] [address]  HISTORICAL INFORMATION:   Selected notes from the MEDICAL RECORD NUMBER    Lab Results  Component Value Date   HGBA1C 5.2 10/03/2019     CURRENT MEDICATIONS: No current outpatient medications on file. (Ophthalmic Drugs)   No current facility-administered medications for this visit. (Ophthalmic Drugs)   Current Outpatient Medications (Other)  Medication Sig  . allopurinol (ZYLOPRIM) 100 MG tablet Take 1 tablet (100 mg total) by mouth 2 (two) times daily. (Patient taking differently: Take 100 mg by mouth 2 (two) times daily as needed. )  . colchicine 0.6 MG tablet Take 1 tablet (0.6 mg total) by mouth daily. May use up to 2 a day on the initial day (Patient taking differently: Take 0.6 mg by mouth as needed. May use up to 2 a day on the initial day)  . cyclobenzaprine (FLEXERIL) 10 MG tablet Take 1 tablet (10 mg total) by mouth 3 (three) times daily as needed for muscle spasms.  Marland Kitchen OVER THE COUNTER MEDICATION Eye vitamin  . rosuvastatin (CRESTOR) 20 MG tablet Take 1 tablet (20 mg total) by mouth daily.  . sertraline  (ZOLOFT) 100 MG tablet Take 0.5 tablets (50 mg total) by mouth daily.  . tamsulosin (FLOMAX) 0.4 MG CAPS capsule Take 2 capsules (0.8 mg total) by mouth daily.   No current facility-administered medications for this visit. (Other)      REVIEW OF SYSTEMS:    ALLERGIES Allergies  Allergen Reactions  . Ciprofloxacin Nausea And Vomiting  . Ciprofloxacin Hcl     REACTION: NAUSIA AND VOMITING  . Sulfamethoxazole-Trimethoprim     REACTION: nausea and vomiting  . Sulfonamide Derivatives     REACTION: itching  . Sulphur [Sulfur] Rash    PAST MEDICAL HISTORY Past Medical History:  Diagnosis Date  . Allergy   . Anxiety   . Cataract   . Chronic foot pain   . Chronic kidney disease   . Chronic leg pain   . Depression 2016  . Gout   . Hiatal hernia   . Hyperlipidemia   . Hypertension   . Renal insufficiency   . Varicose veins    Past Surgical History:  Procedure Laterality Date  . CARPAL TUNNEL RELEASE Left   . CARPAL TUNNEL RELEASE Bilateral   . CHOLECYSTECTOMY  2026  . COLONOSCOPY    . COLONOSCOPY W/ POLYPECTOMY    . EYE SURGERY Bilateral 1958, 2004 **Note De-Identified [place] [facility]usc[facility]** cataracts  . INGUINAL HERNIA REPAIR    . JOINT REPLACEMENT  2002  . TOTAL KNEE ARTHROPLASTY Right 2002  . ulnar intrapment release  07/2008  . VARICOSE VEIN SURGERY Left 1980's per patient   Dr. Dickie La    FAMILY HISTORY Family History  Problem Relation Age of Onset  . Hypertension Mother   . Hyperlipidemia Mother   . Arthritis Mother   . Heart disease Mother   . Huntington's disease Mother   . Cancer Father        pancreatic cancer  . Arthritis Father   . Diabetes Father   . Hypertension Father   . Hyperlipidemia Sister   . Hypertension Sister   . Hypertension Brother   . Vision loss Brother   . Huntington's disease Brother   . Heart disease Maternal Grandmother   . Early death Maternal Grandfather   . Colon cancer Neg Hx   . Esophageal cancer Neg Hx   . Rectal cancer Neg Hx   . Stomach cancer Neg Hx      SOCIAL HISTORY Social History   Tobacco Use  . Smoking status: Never Smoker  . Smokeless tobacco: Never Used  Vaping Use  . Vaping Use: Never used  Substance Use Topics  . Alcohol use: No  . Drug use: No         OPHTHALMIC EXAM: Base Eye Exam    Visual Acuity (ETDRS)      Right Left   Dist [place] CF at 3' 20/80 -2   Dist ph [place] NI 20/70 +2       Tonometry (Tonopen, 1:56 PM)      Right Left   Pressure 14 13       Pupils      Pupils Dark Light Shape React APD   Right PERRL 5 4 Round Brisk None   Left PERRL 5 4 Round Brisk None       Visual Fields (Counting fingers)      Left Right    Full Full       Extraocular Movement      Right Left    Full Full       Neuro/Psych    Oriented x3: Yes   Mood/Affect: Normal       Dilation    Left eye: 1.0% Mydriacyl, 2.5% Phenylephrine @ 2:01 PM        Slit Lamp and Fundus Exam    External Exam      Right Left   External Normal Normal       Slit Lamp Exam      Right Left   Lids/Lashes Normal Normal   Conjunctiva/Sclera White and quiet White and quiet   Cornea Clear Clear   Anterior Chamber Deep and quiet Deep and quiet   Iris Round and reactive Round and reactive   Lens  Centered posterior chamber intraocular lens   Anterior Vitreous Normal Normal       Fundus Exam      Right Left   Posterior Vitreous  Posterior vitreous detachment   Disc  Peripapillary atrophy,    C/D Ratio  0.55   Macula  Macular thickening, Subretinal neovascular membrane, Subretinal fibrosis, no hemorrhage, Hard drusen, Retinal pigment epithelial atrophy - geographic   Vessels  Normal   Periphery  Normal          IMAGING AND PROCEDURES  Imaging and Procedures for 03/19/20  OCT, Retina - OU - Both Eyes **Note De-Identified [place] [facility]bfuscation** Right Eye Quality was good. Scan locations included subfoveal. Central Foveal Thickness: 216. Progression has been stable.   Left Eye Quality was good. Scan locations included subfoveal. Central Foveal Thickness:  403. Progression has worsened. Findings include subretinal fluid, choroidal neovascular membrane, epiretinal membrane.   Notes OD with central scarring from dry ARMD, myopic macular degeneration, increase atrophy over the last 3 years from prior OCT evaluation  OS with less active CNVM inferonasal and a second lesion superior to the fovea, incidental epiretinal membrane noted left eye, nonfoveal distorting       Intravitreal Injection, Pharmacologic Agent - OS - Left Eye       Time Out 03/19/2020. 2:50 PM. Confirmed correct patient, procedure, site, and patient consented.   Anesthesia Topical anesthesia was used. Anesthetic medications included Akten 3.5%.   Procedure Preparation included Tobramycin 0.3%, 10% betadine to eyelids, 5% betadine to ocular surface. A 30 gauge needle was used.   Injection:  2.5 mg Bevacizumab (AVASTIN) 2.5mg /0.35mL SOSY   NDC: 82956-213-08, Lot: [id number]   Route: Intravitreal, Site: Left Eye  Post-op Post injection exam found visual acuity of at least counting fingers. The patient tolerated the procedure well. There were no complications. The patient received written and verbal post procedure care education. Post injection medications were not given.                 ASSESSMENT/PLAN:  Exudative age-related macular degeneration of left eye with active choroidal neovascularization (HCC) Less active CNVM OS, will repeat injection Avastin today      ICD-10-CM   1. Exudative age-related macular degeneration of left eye with active choroidal neovascularization (HCC)  H35.3221 OCT, Retina - OU - Both Eyes    Intravitreal Injection, Pharmacologic Agent - OS - Left Eye    bevacizumab (AVASTIN) SOSY 2.5 mg    1.  Improved overall macular anatomy, less CME, from CNVM nasal to FAZ, will repeat injection today and examination again noted OS in 7 weeks  2.  3.  Ophthalmic Meds Ordered this visit:  Meds ordered this encounter  Medications  .  bevacizumab (AVASTIN) SOSY 2.5 mg       Return in about 7 weeks (around 05/07/2020) for dilate, OS, AVASTIN OCT.  There are no Patient Instructions on file for this visit.   Explained the diagnoses, plan, and follow up with the patient and they expressed understanding.  Patient expressed understanding of the importance of proper follow up care.   Alford Highland Una Yeomans M.D. Diseases & Surgery of the Retina and Vitreous [facility] 03/19/20     Abbreviations: M myopia (nearsighted); A astigmatism; H hyperopia (farsighted); P presbyopia; Mrx spectacle prescription;  CTL contact lenses; OD right eye; OS left eye; OU both eyes  XT exotropia; ET esotropia; PEK punctate epithelial keratitis; PEE punctate epithelial erosions; DES dry eye syndrome; MGD meibomian gland dysfunction; ATs artificial tears; PFAT's preservative free artificial tears; NSC nuclear sclerotic cataract; PSC posterior subcapsular cataract; ERM epi-retinal membrane; PVD posterior vitreous detachment; RD retinal detachment; DM diabetes mellitus; DR diabetic retinopathy; NPDR non-proliferative diabetic retinopathy; PDR proliferative diabetic retinopathy; CSME clinically significant macular edema; DME diabetic macular edema; dbh dot blot hemorrhages; CWS cotton wool spot; POAG primary open angle glaucoma; C/D cup-to-disc ratio; HVF humphrey visual field; GVF goldmann visual field; OCT optical coherence tomography; IOP intraocular pressure; BRVO Branch retinal vein occlusion; CRVO central retinal vein occlusion; CRAO central retinal artery occlusion; BRAO branch retinal artery occlusion; RT retinal tear; SB scleral buckle; PPV **Note De-Identified [place] [facility]bfuscation** pars plana vitrectomy; VH Vitreous hemorrhage; PRP panretinal laser photocoagulation; IVK intravitreal kenalog; VMT vitreomacular traction; MH Macular hole;  NVD neovascularization of the disc; NVE neovascularization elsewhere; AREDS age related eye disease study; ARMD age related macular degeneration;  POAG primary open angle glaucoma; EBMD epithelial/anterior basement membrane dystrophy; ACIOL anterior chamber intraocular lens; IOL intraocular lens; PCIOL posterior chamber intraocular lens; Phaco/IOL phacoemulsification with intraocular lens placement; PRK photorefractive keratectomy; LASIK laser assisted in situ keratomileusis; HTN hypertension; DM diabetes mellitus; COPD chronic obstructive pulmonary disease

## 2020-03-26 ENCOUNTER — Encounter (INDEPENDENT_AMBULATORY_CARE_PROVIDER_SITE_OTHER): Payer: BLUE CROSS/BLUE SHIELD | Admitting: Ophthalmology

## 2020-04-03 ENCOUNTER — Telehealth: Payer: Self-pay

## 2020-04-06 ENCOUNTER — Ambulatory Visit: Payer: BLUE CROSS/BLUE SHIELD | Admitting: Family Medicine

## 2020-04-17 ENCOUNTER — Other Ambulatory Visit: Payer: Self-pay | Admitting: Family Medicine

## 2020-04-17 DIAGNOSIS — E781 Pure hyperglyceridemia: Secondary | ICD-10-CM

## 2020-04-17 DIAGNOSIS — I10 Essential (primary) hypertension: Secondary | ICD-10-CM

## 2020-04-17 DIAGNOSIS — E782 Mixed hyperlipidemia: Secondary | ICD-10-CM

## 2020-05-04 ENCOUNTER — Other Ambulatory Visit: Payer: Self-pay

## 2020-05-04 ENCOUNTER — Ambulatory Visit: Payer: BLUE CROSS/BLUE SHIELD | Admitting: Family Medicine

## 2020-05-04 ENCOUNTER — Encounter: Payer: Self-pay | Admitting: Family Medicine

## 2020-05-04 VITALS — BP 135/86 | HR 71 | Ht 71.0 in | Wt 237.0 lb

## 2020-05-04 DIAGNOSIS — E782 Mixed hyperlipidemia: Secondary | ICD-10-CM | POA: Diagnosis not present

## 2020-05-04 DIAGNOSIS — N1832 Chronic kidney disease, stage 3b: Secondary | ICD-10-CM | POA: Diagnosis not present

## 2020-05-04 DIAGNOSIS — E781 Pure hyperglyceridemia: Secondary | ICD-10-CM

## 2020-05-04 DIAGNOSIS — N183 Chronic kidney disease, stage 3 unspecified: Secondary | ICD-10-CM | POA: Insufficient documentation

## 2020-05-04 DIAGNOSIS — N400 Enlarged prostate without lower urinary tract symptoms: Secondary | ICD-10-CM

## 2020-05-04 DIAGNOSIS — I10 Essential (primary) hypertension: Secondary | ICD-10-CM

## 2020-05-04 DIAGNOSIS — R7303 Prediabetes: Secondary | ICD-10-CM

## 2020-05-04 DIAGNOSIS — F411 Generalized anxiety disorder: Secondary | ICD-10-CM

## 2020-05-04 DIAGNOSIS — M1A9XX0 Chronic gout, unspecified, without tophus (tophi): Secondary | ICD-10-CM

## 2020-05-04 LAB — BAYER DCA HB A1C WAIVED: HB A1C (BAYER DCA - WAIVED): 5.5 % (ref <7.0)

## 2020-05-04 MED ORDER — TAMSULOSIN HCL 0.4 MG PO CAPS: 0.8000 mg | ORAL_CAPSULE | Freq: Every day | ORAL | 3 refills | Status: DC

## 2020-05-04 MED ORDER — ALLOPURINOL 100 MG PO TABS: 100.0000 mg | ORAL_TABLET | Freq: Two times a day (BID) | ORAL | 3 refills | Status: DC

## 2020-05-04 MED ORDER — ROSUVASTATIN CALCIUM 20 MG PO TABS: 20.0000 mg | ORAL_TABLET | Freq: Every day | ORAL | 3 refills | Status: DC

## 2020-05-04 MED ORDER — SERTRALINE HCL 100 MG PO TABS: 50.0000 mg | ORAL_TABLET | Freq: Every day | ORAL | 3 refills | Status: DC

## 2020-05-04 MED ORDER — LOSARTAN POTASSIUM 25 MG PO TABS
25.0000 mg | ORAL_TABLET | Freq: Every day | ORAL | 1 refills | Status: DC
Start: 2020-05-04 — End: 2020-10-01

## 2020-05-04 NOTE — Progress Notes (Signed)
**Note De-Identified [place] [facility]ation** BP 135/86   Pulse 71   Ht 5\' 11"  (1.803 m)   Wt 237 lb (107.5 kg)   SpO2 99%   BMI 33.05 kg/m    Subjective:   Patient ID: Daniel Brock, male    DOB: 03/28/1945, 75 y.o.   MRN: [id number]  HPI: Daniel Brock is a 75 y.o. male presenting on 05/04/2020 for Medical Management of Chronic Issues   HPI BPH Patient is coming in for recheck on BPH Symptoms: Nocturia, improved with medicine but still not gone Medication: Flomax Last PSA: 1 year ago, elevated Patient is having more nocturia but improved slightly, does not want to change medications currently  Prediabetes Patient comes in today for recheck of his diabetes. Patient has been currently taking no medication and has been diet controlled. Patient is currently on an ACE inhibitor/ARB. Patient has not seen an ophthalmologist this year. Patient denies any issues with their feet. The symptom started onset as an adult CKD stage III, sees Dr. Theador Hawthorne and hypertriglyceridemia and hyperlipidemia and hypertension ARE RELATED TO DM   Hypertension Patient is currently on no medication currently but will restart on losartan, he did restart himself about 4 weeks ago because he was having elevated blood pressures at home., and their blood pressure today is 135/86. Patient denies any lightheadedness or dizziness. Patient denies headaches, blurred vision, chest pains, shortness of breath, or weakness. Denies any side effects from medication and is content with current medication.   Hyperlipidemia and hypertriglyceridemia Patient is coming in for recheck of his hyperlipidemia. The patient is currently taking Crestor. They deny any issues with myalgias or history of liver damage from it. They deny any focal numbness or weakness or chest pain.   Anxiety recheck Patient is coming in for anxiety recheck and is currently taking the sertraline.  He says is been down a little bit more because of some recent losses in his family including both of his  brothers in the past year.  He says he is doing okay with the medication.  Patient comes in complaining of ringing in his ears is been bothering him over the past Year.  He says is worse at night and just wonders if there is anything that can be done for this.  Relevant past medical, surgical, family and social history reviewed and updated as indicated. Interim medical history since our last visit reviewed. Allergies and medications reviewed and updated.  Review of Systems  Constitutional: Negative for chills and fever.  HENT: Positive for tinnitus.   Respiratory: Negative for shortness of breath and wheezing.   Cardiovascular: Negative for chest pain and leg swelling.  Musculoskeletal: Negative for back pain and gait problem.  Skin: Negative for rash.  Neurological: Negative for dizziness, weakness and light-headedness.  All other systems reviewed and are negative.   Per HPI unless specifically indicated above   Allergies as of 05/04/2020      Reactions   Ciprofloxacin Nausea And Vomiting   Ciprofloxacin Hcl    REACTION: NAUSIA AND VOMITING   Sulfamethoxazole-trimethoprim    REACTION: nausea and vomiting   Sulfonamide Derivatives    REACTION: itching   Sulphur [elemental Sulfur] Rash      Medication List       Accurate as of May 04, 2020  1:40 PM. If you have any questions, ask your nurse or doctor.        allopurinol 100 MG tablet Commonly known as: ZYLOPRIM Take 1 tablet (100 mg total) by mouth **Note De-Identified [place] [facility]uscation** 2 (two) times daily. What changed:   when to take this  reasons to take this   colchicine 0.6 MG tablet Take 1 tablet (0.6 mg total) by mouth daily. May use up to 2 a day on the initial day What changed:   when to take this  reasons to take this   cyclobenzaprine 10 MG tablet Commonly known as: FLEXERIL Take 1 tablet (10 mg total) by mouth 3 (three) times daily as needed for muscle spasms.   losartan 25 MG tablet Commonly known as: Cozaar Take 1 tablet (25  mg total) by mouth daily. Started by: Worthy Rancher, MD   OVER THE COUNTER MEDICATION Eye vitamin   rosuvastatin 20 MG tablet Commonly known as: CRESTOR Take 1 tablet (20 mg total) by mouth daily.   sertraline 100 MG tablet Commonly known as: ZOLOFT Take 0.5 tablets (50 mg total) by mouth daily.   tamsulosin 0.4 MG Caps capsule Commonly known as: FLOMAX Take 2 capsules (0.8 mg total) by mouth daily.        Objective:   BP 135/86   Pulse 71   Ht 5\' 11"  (1.803 m)   Wt 237 lb (107.5 kg)   SpO2 99%   BMI 33.05 kg/m   Wt Readings from Last 3 Encounters:  05/04/20 237 lb (107.5 kg)  10/03/19 232 lb (105.2 kg)  07/04/19 233 lb 4 oz (105.8 kg)    Physical Exam Vitals and nursing note reviewed.  Constitutional:      General: He is not in acute distress.    Appearance: He is well-developed and well-nourished. He is not diaphoretic.  Eyes:     General: No scleral icterus.    Extraocular Movements: EOM normal.     Conjunctiva/sclera: Conjunctivae normal.  Neck:     Thyroid: No thyromegaly.  Cardiovascular:     Rate and Rhythm: Normal rate and regular rhythm.     Pulses: Intact distal pulses.     Heart sounds: Normal heart sounds. No murmur heard.   Pulmonary:     Effort: Pulmonary effort is normal. No respiratory distress.     Breath sounds: Normal breath sounds. No wheezing.  Musculoskeletal:        General: No edema. Normal range of motion.     Cervical back: Neck supple.  Lymphadenopathy:     Cervical: No cervical adenopathy.  Skin:    General: Skin is warm and dry.     Findings: No rash.  Neurological:     Mental Status: He is alert and oriented to person, place, and time.     Coordination: Coordination normal.  Psychiatric:        Mood and Affect: Mood and affect normal.        Behavior: Behavior normal.       Assessment & Plan:   Problem List Items Addressed This Visit      Cardiovascular and Mediastinum   Essential hypertension    Relevant Medications   rosuvastatin (CRESTOR) 20 MG tablet   losartan (COZAAR) 25 MG tablet     Genitourinary   BPH (benign prostatic hyperplasia)   Relevant Medications   tamsulosin (FLOMAX) 0.4 MG CAPS capsule   CKD (chronic kidney disease), stage III (HCC)   Relevant Orders   CBC with Differential/Platelet   Renal function panel   PTH, Intact and Calcium     Other   HLD (hyperlipidemia)   Relevant Medications   rosuvastatin (CRESTOR) 20 MG tablet   losartan (COZAAR) 25 **Note De-Identified [place] Obfuscation** MG tablet   Other Relevant Orders   Lipid panel   Gout   Relevant Medications   allopurinol (ZYLOPRIM) 100 MG tablet   Anxiety state   Relevant Medications   sertraline (ZOLOFT) 100 MG tablet   Pre-diabetes - Primary   Relevant Orders   Bayer DCA Hb A1c Waived   Hypertriglyceridemia   Relevant Medications   rosuvastatin (CRESTOR) 20 MG tablet   losartan (COZAAR) 25 MG tablet      Recommended hearing testing for ringing in his ears.  Continue current medication for hypertension and cholesterol and prediabetes except for Will add losartan because he is already started retaking and his blood pressure looks good on it.  Patient denies any gout issues in the past year and just needs refills. Follow up plan: Return in about 6 months (around 11/01/2020), or if symptoms worsen or fail to improve, for Prediabetes and anxiety and hypertriglyceridemia.  Counseling provided for all of the vaccine components Orders Placed This Encounter  Procedures  . CBC with Differential/Platelet  . Lipid panel  . Bayer DCA Hb A1c Waived  . Renal function panel  . PTH, Intact and Calcium    Caryl Pina, MD [facility] 05/04/2020, 1:40 PM

## 2020-05-05 LAB — CBC WITH DIFFERENTIAL/PLATELET
Basophils Absolute: 0 10*3/uL (ref 0.0–0.2)
Basos: 1 %
EOS (ABSOLUTE): 0.1 10*3/uL (ref 0.0–0.4)
Eos: 2 %
Hematocrit: 47.7 % (ref 37.5–51.0)
Hemoglobin: 15.7 g/dL (ref 13.0–17.7)
Immature Grans (Abs): 0 10*3/uL (ref 0.0–0.1)
Immature Granulocytes: 1 %
Lymphocytes Absolute: 1.2 10*3/uL (ref 0.7–3.1)
Lymphs: 24 %
MCH: 28.1 pg (ref 26.6–33.0)
MCHC: 32.9 g/dL (ref 31.5–35.7)
MCV: 86 fL (ref 79–97)
Monocytes Absolute: 0.4 10*3/uL (ref 0.1–0.9)
Monocytes: 8 %
Neutrophils Absolute: 3.2 10*3/uL (ref 1.4–7.0)
Neutrophils: 64 %
Platelets: 135 10*3/uL — ABNORMAL LOW (ref 150–450)
RBC: 5.58 x10E6/uL (ref 4.14–5.80)
RDW: 13.6 % (ref 11.6–15.4)
WBC: 5 10*3/uL (ref 3.4–10.8)

## 2020-05-05 LAB — PTH, INTACT AND CALCIUM: PTH: 54 pg/mL (ref 15–65)

## 2020-05-05 LAB — RENAL FUNCTION PANEL
Albumin: 4.4 g/dL (ref 3.7–4.7)
BUN/Creatinine Ratio: 15 (ref 10–24)
BUN: 21 mg/dL (ref 8–27)
CO2: 22 mmol/L (ref 20–29)
Calcium: 9 mg/dL (ref 8.6–10.2)
Chloride: 106 mmol/L (ref 96–106)
Creatinine, Ser: 1.42 mg/dL — ABNORMAL HIGH (ref 0.76–1.27)
GFR calc Af Amer: 55 mL/min/{1.73_m2} — ABNORMAL LOW (ref >59)
GFR calc non Af Amer: 48 mL/min/{1.73_m2} — ABNORMAL LOW (ref >59)
Glucose: 103 mg/dL — ABNORMAL HIGH (ref 65–99)
Phosphorus: 3.8 mg/dL (ref 2.8–4.1)
Potassium: 4.7 mmol/L (ref 3.5–5.2)
Sodium: 146 mmol/L — ABNORMAL HIGH (ref 134–144)

## 2020-05-05 LAB — LIPID PANEL
Chol/HDL Ratio: 5.2 ratio — ABNORMAL HIGH (ref 0.0–5.0)
Cholesterol, Total: 197 mg/dL (ref 100–199)
HDL: 38 mg/dL — ABNORMAL LOW (ref >39)
LDL Chol Calc (NIH): 103 mg/dL — ABNORMAL HIGH (ref 0–99)
Triglycerides: 327 mg/dL — ABNORMAL HIGH (ref 0–149)
VLDL Cholesterol Cal: 56 mg/dL — ABNORMAL HIGH (ref 5–40)

## 2020-05-07 ENCOUNTER — Encounter (INDEPENDENT_AMBULATORY_CARE_PROVIDER_SITE_OTHER): Payer: BLUE CROSS/BLUE SHIELD | Admitting: Ophthalmology

## 2020-05-11 ENCOUNTER — Ambulatory Visit (INDEPENDENT_AMBULATORY_CARE_PROVIDER_SITE_OTHER): Payer: BLUE CROSS/BLUE SHIELD | Admitting: Ophthalmology

## 2020-05-11 ENCOUNTER — Encounter (INDEPENDENT_AMBULATORY_CARE_PROVIDER_SITE_OTHER): Payer: Self-pay | Admitting: Ophthalmology

## 2020-05-11 ENCOUNTER — Other Ambulatory Visit: Payer: Self-pay

## 2020-05-11 DIAGNOSIS — H353114 Nonexudative age-related macular degeneration, right eye, advanced atrophic with subfoveal involvement: Secondary | ICD-10-CM

## 2020-05-11 DIAGNOSIS — H353221 Exudative age-related macular degeneration, left eye, with active choroidal neovascularization: Secondary | ICD-10-CM

## 2020-05-11 MED ORDER — BEVACIZUMAB 2.5 MG/0.1ML IZ SOSY
2.5000 mg | PREFILLED_SYRINGE | INTRAVITREAL | Status: AC | PRN
  Administered 2020-05-11: 2.5 mg via INTRAVITREAL

## 2020-05-11 NOTE — Assessment & Plan Note (Signed)
**Note De-Identified [place] [facility]bfuscation** The foveal lesion with CME, repeat injection today at 7 weeks to maintain and evaluate again next in 8 weeks

## 2020-05-11 NOTE — Assessment & Plan Note (Signed)
**Note De-identified [place] Obfuscation** No active CNVM 

## 2020-05-11 NOTE — Progress Notes (Signed)
**Note De-Identified [place] Obfuscation** 05/11/2020     CHIEF COMPLAINT Patient presents for Retina Follow Up (7 Week F/U OS, poss Avastin OS//Pt denies noticeable changes to [place] OU since last visit. Pt denies ocular pain, flashes of light, or floaters OU. //)   HISTORY OF PRESENT ILLNESS: Daniel Brock is a 75 y.o. male who presents to the clinic today for:   HPI    Retina Follow Up    Patient presents with  Wet AMD.  In left eye.  This started 7 weeks ago.  Severity is mild.  Duration of 7 weeks.  Since onset it is stable. Additional comments: 7 Week F/U OS, poss Avastin OS  Pt denies noticeable changes to [place] OU since last visit. Pt denies ocular pain, flashes of light, or floaters OU.          Last edited by Rockie Neighbours, [place] on 05/11/2020  2:24 PM. (History)      Referring physician: Dettinger, Fransisca Kaufmann, MD [place],  [place] [id number]  HISTORICAL INFORMATION:   Selected notes from the MEDICAL RECORD NUMBER    Lab Results  Component Value Date   HGBA1C 5.5 05/04/2020     CURRENT MEDICATIONS: No current outpatient medications on file. (Ophthalmic Drugs)   No current facility-administered medications for this visit. (Ophthalmic Drugs)   Current Outpatient Medications (Other)  Medication Sig  . allopurinol (ZYLOPRIM) 100 MG tablet Take 1 tablet (100 mg total) by mouth 2 (two) times daily.  . colchicine 0.6 MG tablet Take 1 tablet (0.6 mg total) by mouth daily. May use up to 2 a day on the initial day (Patient taking differently: Take 0.6 mg by mouth as needed. May use up to 2 a day on the initial day)  . cyclobenzaprine (FLEXERIL) 10 MG tablet Take 1 tablet (10 mg total) by mouth 3 (three) times daily as needed for muscle spasms.  Marland Kitchen losartan (COZAAR) 25 MG tablet Take 1 tablet (25 mg total) by mouth daily.  Marland Kitchen OVER THE COUNTER MEDICATION Eye vitamin  . rosuvastatin (CRESTOR) 20 MG tablet Take 1 tablet (20 mg total) by mouth daily.  . sertraline (ZOLOFT) 100 MG tablet Take 0.5 tablets (50 mg  total) by mouth daily.  . tamsulosin (FLOMAX) 0.4 MG CAPS capsule Take 2 capsules (0.8 mg total) by mouth daily.   No current facility-administered medications for this visit. (Other)      REVIEW OF SYSTEMS:    ALLERGIES Allergies  Allergen Reactions  . Ciprofloxacin Nausea And Vomiting  . Ciprofloxacin Hcl     REACTION: NAUSIA AND VOMITING  . Sulfamethoxazole-Trimethoprim     REACTION: nausea and vomiting  . Sulfonamide Derivatives     REACTION: itching  . Sulphur [Elemental Sulfur] Rash    PAST MEDICAL HISTORY Past Medical History:  Diagnosis Date  . Allergy   . Anxiety   . Cataract   . Chronic foot pain   . Chronic kidney disease   . Chronic leg pain   . Depression 2016  . Gout   . Hiatal hernia   . Hyperlipidemia   . Hypertension   . Renal insufficiency   . Varicose veins    Past Surgical History:  Procedure Laterality Date  . CARPAL TUNNEL RELEASE Left   . CARPAL TUNNEL RELEASE Bilateral   . CHOLECYSTECTOMY  2026  . COLONOSCOPY    . COLONOSCOPY W/ POLYPECTOMY    . EYE SURGERY Bilateral 1958, 2004   cataracts  . INGUINAL HERNIA REPAIR    . **Note De-Identified [place] Obfuscation** JOINT REPLACEMENT  2002  . TOTAL KNEE ARTHROPLASTY Right 2002  . ulnar intrapment release  07/2008  . VARICOSE VEIN SURGERY Left 1980's per patient   Dr. Maurene Capes    FAMILY HISTORY Family History  Problem Relation Age of Onset  . Hypertension Mother   . Hyperlipidemia Mother   . Arthritis Mother   . Heart disease Mother   . Huntington's disease Mother   . Cancer Father        pancreatic cancer  . Arthritis Father   . Diabetes Father   . Hypertension Father   . Hyperlipidemia Sister   . Hypertension Sister   . Hypertension Brother   . Vision loss Brother   . Huntington's disease Brother   . Heart disease Maternal Grandmother   . Early death Maternal Grandfather   . Colon cancer Neg Hx   . Esophageal cancer Neg Hx   . Rectal cancer Neg Hx   . Stomach cancer Neg Hx     SOCIAL HISTORY Social History    Tobacco Use  . Smoking status: Never Smoker  . Smokeless tobacco: Never Used  Vaping Use  . Vaping Use: Never used  Substance Use Topics  . Alcohol use: No  . Drug use: No         OPHTHALMIC EXAM:  Base Eye Exam    Visual Acuity (ETDRS)      Right Left   Dist [place] 20/200 -1 20/100 -2   Dist ph [place] 20/200 +1 20/60 +2       Tonometry (Tonopen, 2:25 PM)      Right Left   Pressure 19 11       Pupils      Pupils Dark Light Shape React APD   Right PERRL 5 4 Round Brisk None   Left PERRL 5 4 Round Brisk None       Visual Fields (Counting fingers)      Left Right    Full Full       Extraocular Movement      Right Left    Full Full       Neuro/Psych    Oriented x3: Yes   Mood/Affect: Normal       Dilation    Left eye: 1.0% Mydriacyl, 2.5% Phenylephrine @ 2:32 PM        Slit Lamp and Fundus Exam    External Exam      Right Left   External Normal Normal       Slit Lamp Exam      Right Left   Lids/Lashes Normal Normal   Conjunctiva/Sclera White and quiet White and quiet   Cornea Clear Clear   Anterior Chamber Deep and quiet Deep and quiet   Iris Round and reactive Round and reactive   Lens  Centered posterior chamber intraocular lens   Anterior Vitreous Normal Normal       Fundus Exam      Right Left   Posterior Vitreous  Posterior vitreous detachment   Disc  Peripapillary atrophy,    C/D Ratio  0.55   Macula  Macular thickening, Subretinal neovascular membrane pigmented resolved, Subretinal fibrosis, no hemorrhage, Hard drusen, Retinal pigment epithelial atrophy - geographic   Vessels  Normal   Periphery  Normal          IMAGING AND PROCEDURES  Imaging and Procedures for 05/11/20  OCT, Retina - OU - Both Eyes       Right Eye Quality was borderline. Scan **Note De-Identified [place] Obfuscation** locations included subfoveal. Central Foveal Thickness: 216. Progression has been stable. Findings include central retinal atrophy, outer retinal atrophy, subretinal scarring.   Left  Eye Quality was borderline. Scan locations included subfoveal. Central Foveal Thickness: 403. Progression has worsened. Findings include subretinal fluid, choroidal neovascular membrane, epiretinal membrane.   Notes OD with central scarring from dry ARMD, myopic macular degeneration, increase atrophy over the last 3 years from prior OCT evaluation  OS with less active CNVM inferonasal and a second lesion superior to the fovea, incidental epiretinal membrane noted left eye, nonfoveal distorting, current currently at 7-week follow-up.  We will repeat injection today examination next in 8 weeks       Intravitreal Injection, Pharmacologic Agent - OS - Left Eye       Time Out 05/11/2020. 4:02 PM. Confirmed correct patient, procedure, site, and patient consented.   Anesthesia Topical anesthesia was used. Anesthetic medications included [place] 3.5%.   Procedure Preparation included Tobramycin 0.3%, 10% betadine to eyelids, 5% betadine to ocular surface. A 30 gauge needle was used.   Injection:  2.5 mg Bevacizumab (AVASTIN) 2.5mg /0.50mL SOSY   NDC: 54656-812-75, Lot: [id number]   Route: Intravitreal, Site: Left Eye  Post-op Post injection exam found visual acuity of at least counting fingers. The patient tolerated the procedure well. There were no complications. The patient received written and verbal post procedure care education. Post injection medications were not given.                 ASSESSMENT/PLAN:  Advanced nonexudative age-related macular degeneration of right eye with subfoveal involvement No active CNVM  Exudative age-related macular degeneration of left eye with active choroidal neovascularization (HCC) The foveal lesion with CME, repeat injection today at 7 weeks to maintain and evaluate again next in 8 weeks      ICD-10-CM   1. Exudative age-related macular degeneration of left eye with active choroidal neovascularization (HCC)  H35.3221 OCT, Retina - OU - Both Eyes     Intravitreal Injection, Pharmacologic Agent - OS - Left Eye    bevacizumab (AVASTIN) SOSY 2.5 mg  2. Advanced nonexudative age-related macular degeneration of right eye with subfoveal involvement  H35.3114     1.  Monocular patient with left eye juxta foveal CNV, day at 7-week follow-up will repeat injection left eye today Avastin and examination next in 8 weeks  2.  3.  Ophthalmic Meds Ordered this visit:  Meds ordered this encounter  Medications  . bevacizumab (AVASTIN) SOSY 2.5 mg       Return in about 8 weeks (around 07/06/2020) for OCT next visit after dilation, dilate, OS, AVASTIN OCT.  There are no Patient Instructions on file for this visit.   Explained the diagnoses, plan, and follow up with the patient and they expressed understanding.  Patient expressed understanding of the importance of proper follow up care.   Clent Demark Rankin M.D. Diseases & Surgery of the Retina and Vitreous Retina & Diabetic [place] 05/11/20     Abbreviations: M myopia (nearsighted); A astigmatism; H hyperopia (farsighted); P presbyopia; Mrx spectacle prescription;  CTL contact lenses; OD right eye; OS left eye; OU both eyes  XT exotropia; ET esotropia; PEK punctate epithelial keratitis; PEE punctate epithelial erosions; DES dry eye syndrome; MGD meibomian gland dysfunction; ATs artificial tears; PFAT's preservative free artificial tears; [place] nuclear sclerotic cataract; PSC posterior subcapsular cataract; ERM epi-retinal membrane; PVD posterior vitreous detachment; RD retinal detachment; DM diabetes mellitus; DR diabetic retinopathy; NPDR non-proliferative diabetic retinopathy; PDR proliferative **Note De-Identified [place] Obfuscation** diabetic retinopathy; CSME clinically significant macular edema; DME diabetic macular edema; dbh dot blot hemorrhages; CWS cotton wool spot; POAG primary open angle glaucoma; C/D cup-to-disc ratio; HVF humphrey visual field; GVF goldmann visual field; OCT optical coherence tomography; IOP intraocular  pressure; BRVO Branch retinal vein occlusion; CRVO central retinal vein occlusion; CRAO central retinal artery occlusion; BRAO branch retinal artery occlusion; RT retinal tear; SB scleral buckle; PPV pars plana vitrectomy; VH Vitreous hemorrhage; PRP panretinal laser photocoagulation; IVK intravitreal kenalog; VMT vitreomacular traction; MH Macular hole;  NVD neovascularization of the disc; NVE neovascularization elsewhere; AREDS age related eye disease study; ARMD age related macular degeneration; POAG primary open angle glaucoma; EBMD epithelial/anterior basement membrane dystrophy; ACIOL anterior chamber intraocular lens; IOL intraocular lens; PCIOL posterior chamber intraocular lens; Phaco/IOL phacoemulsification with intraocular lens placement; [place] photorefractive keratectomy; LASIK laser assisted in situ keratomileusis; HTN hypertension; DM diabetes mellitus; COPD chronic obstructive pulmonary disease

## 2020-06-05 ENCOUNTER — Other Ambulatory Visit: Payer: Self-pay

## 2020-06-05 ENCOUNTER — Other Ambulatory Visit: Payer: BLUE CROSS/BLUE SHIELD

## 2020-06-05 DIAGNOSIS — I129 Hypertensive chronic kidney disease with stage 1 through stage 4 chronic kidney disease, or unspecified chronic kidney disease: Secondary | ICD-10-CM | POA: Diagnosis not present

## 2020-06-05 DIAGNOSIS — E559 Vitamin D deficiency, unspecified: Secondary | ICD-10-CM | POA: Diagnosis not present

## 2020-06-05 DIAGNOSIS — E870 Hyperosmolality and hypernatremia: Secondary | ICD-10-CM | POA: Diagnosis not present

## 2020-06-05 DIAGNOSIS — D696 Thrombocytopenia, unspecified: Secondary | ICD-10-CM | POA: Diagnosis not present

## 2020-06-11 DIAGNOSIS — D696 Thrombocytopenia, unspecified: Secondary | ICD-10-CM | POA: Diagnosis not present

## 2020-06-11 DIAGNOSIS — N200 Calculus of kidney: Secondary | ICD-10-CM | POA: Diagnosis not present

## 2020-06-11 DIAGNOSIS — E559 Vitamin D deficiency, unspecified: Secondary | ICD-10-CM | POA: Diagnosis not present

## 2020-06-11 DIAGNOSIS — I129 Hypertensive chronic kidney disease with stage 1 through stage 4 chronic kidney disease, or unspecified chronic kidney disease: Secondary | ICD-10-CM | POA: Diagnosis not present

## 2020-07-06 ENCOUNTER — Encounter (INDEPENDENT_AMBULATORY_CARE_PROVIDER_SITE_OTHER): Payer: Self-pay | Admitting: Ophthalmology

## 2020-07-06 ENCOUNTER — Other Ambulatory Visit: Payer: Self-pay

## 2020-07-06 ENCOUNTER — Ambulatory Visit (INDEPENDENT_AMBULATORY_CARE_PROVIDER_SITE_OTHER): Payer: BLUE CROSS/BLUE SHIELD | Admitting: Ophthalmology

## 2020-07-06 DIAGNOSIS — H353114 Nonexudative age-related macular degeneration, right eye, advanced atrophic with subfoveal involvement: Secondary | ICD-10-CM | POA: Diagnosis not present

## 2020-07-06 DIAGNOSIS — H353221 Exudative age-related macular degeneration, left eye, with active choroidal neovascularization: Secondary | ICD-10-CM | POA: Diagnosis not present

## 2020-07-06 MED ORDER — BEVACIZUMAB 2.5 MG/0.1ML IZ SOSY
2.5000 mg | PREFILLED_SYRINGE | INTRAVITREAL | Status: AC | PRN
  Administered 2020-07-06: 2.5 mg via INTRAVITREAL

## 2020-07-06 NOTE — Assessment & Plan Note (Signed)
**Note De-Identified [place] Obfuscation** Accounts for visual acuity OD

## 2020-07-06 NOTE — Assessment & Plan Note (Signed)
**Note De-Identified [place] Obfuscation** Active in the past, now extending the interval of examination to 8 weeks still stabilized with evidence of intraretinal fluid lingering, subfoveal pigmented lesion noted clinically.  We will repeat injection Avastin today and extend examined nation interval to 10 weeks

## 2020-07-06 NOTE — Progress Notes (Signed)
**Note De-Identified [place] Obfuscation** 07/06/2020     CHIEF COMPLAINT Patient presents for Retina Follow Up (8 Wk F/U OS, poss Avastin OS//Pt reports stable poor [place] OU. Pt denies new symptoms OU.)   HISTORY OF PRESENT ILLNESS: Daniel Brock is a 75 y.o. male who presents to the clinic today for:   HPI    Retina Follow Up    Patient presents with  Wet AMD.  In left eye.  This started 8 weeks ago.  Severity is moderate.  Duration of 8 weeks.  Since onset it is stable. Additional comments: 8 Wk F/U OS, poss Avastin OS  Pt reports stable poor [place] OU. Pt denies new symptoms OU.       Last edited by Rockie Neighbours, [place] on 07/06/2020  1:43 PM. (History)      Referring physician: Dettinger, Fransisca Kaufmann, MD [place],  [place] [address]  HISTORICAL INFORMATION:   Selected notes from the MEDICAL RECORD NUMBER    Lab Results  Component Value Date   HGBA1C 5.5 05/04/2020     CURRENT MEDICATIONS: No current outpatient medications on file. (Ophthalmic Drugs)   No current facility-administered medications for this visit. (Ophthalmic Drugs)   Current Outpatient Medications (Other)  Medication Sig  . allopurinol (ZYLOPRIM) 100 MG tablet Take 1 tablet (100 mg total) by mouth 2 (two) times daily.  . colchicine 0.6 MG tablet Take 1 tablet (0.6 mg total) by mouth daily. May use up to 2 a day on the initial day (Patient taking differently: Take 0.6 mg by mouth as needed. May use up to 2 a day on the initial day)  . cyclobenzaprine (FLEXERIL) 10 MG tablet Take 1 tablet (10 mg total) by mouth 3 (three) times daily as needed for muscle spasms.  Marland Kitchen losartan (COZAAR) 25 MG tablet Take 1 tablet (25 mg total) by mouth daily.  Marland Kitchen OVER THE COUNTER MEDICATION Eye vitamin  . rosuvastatin (CRESTOR) 20 MG tablet Take 1 tablet (20 mg total) by mouth daily.  . sertraline (ZOLOFT) 100 MG tablet Take 0.5 tablets (50 mg total) by mouth daily.  . tamsulosin (FLOMAX) 0.4 MG CAPS capsule Take 2 capsules (0.8 mg total) by mouth daily.   No  current facility-administered medications for this visit. (Other)      REVIEW OF SYSTEMS:    ALLERGIES Allergies  Allergen Reactions  . Ciprofloxacin Nausea And Vomiting  . Ciprofloxacin Hcl     REACTION: NAUSIA AND VOMITING  . Sulfamethoxazole-Trimethoprim     REACTION: nausea and vomiting  . Sulfonamide Derivatives     REACTION: itching  . Sulphur [Elemental Sulfur] Rash    PAST MEDICAL HISTORY Past Medical History:  Diagnosis Date  . Allergy   . Anxiety   . Cataract   . Chronic foot pain   . Chronic kidney disease   . Chronic leg pain   . Depression 2016  . Gout   . Hiatal hernia   . Hyperlipidemia   . Hypertension   . Renal insufficiency   . Varicose veins    Past Surgical History:  Procedure Laterality Date  . CARPAL TUNNEL RELEASE Left   . CARPAL TUNNEL RELEASE Bilateral   . CHOLECYSTECTOMY  2026  . COLONOSCOPY    . COLONOSCOPY W/ POLYPECTOMY    . EYE SURGERY Bilateral 1958, 2004   cataracts  . INGUINAL HERNIA REPAIR    . JOINT REPLACEMENT  2002  . TOTAL KNEE ARTHROPLASTY Right 2002  . ulnar intrapment release  07/2008  . **Note De-Identified [place] Obfuscation** VARICOSE VEIN SURGERY Left 1980's per patient   Dr. Maurene Capes    FAMILY HISTORY Family History  Problem Relation Age of Onset  . Hypertension Mother   . Hyperlipidemia Mother   . Arthritis Mother   . Heart disease Mother   . Huntington's disease Mother   . Cancer Father        pancreatic cancer  . Arthritis Father   . Diabetes Father   . Hypertension Father   . Hyperlipidemia Sister   . Hypertension Sister   . Hypertension Brother   . Vision loss Brother   . Huntington's disease Brother   . Heart disease Maternal Grandmother   . Early death Maternal Grandfather   . Colon cancer Neg Hx   . Esophageal cancer Neg Hx   . Rectal cancer Neg Hx   . Stomach cancer Neg Hx     SOCIAL HISTORY Social History   Tobacco Use  . Smoking status: Never Smoker  . Smokeless tobacco: Never Used  Vaping Use  . Vaping Use: Never  used  Substance Use Topics  . Alcohol use: No  . Drug use: No         OPHTHALMIC EXAM: Base Eye Exam    Visual Acuity (ETDRS)      Right Left   Dist [place] CF at 3' 20/80 -2   Dist ph [place] 20/100 -2 20/60 +1       Tonometry (Tonopen, 1:47 PM)      Right Left   Pressure 19 19       Pupils      Pupils Dark Light Shape React APD   Right PERRL 6 5 Round Brisk None   Left PERRL 6 5 Round Brisk None       Visual Fields (Counting fingers)      Left Right    Full Full       Extraocular Movement      Right Left    Full Full       Neuro/Psych    Oriented x3: Yes   Mood/Affect: Normal       Dilation    Left eye: 1.0% Mydriacyl, 2.5% Phenylephrine @ 1:47 PM        Slit Lamp and Fundus Exam    External Exam      Right Left   External Normal Normal       Slit Lamp Exam      Right Left   Lids/Lashes Normal Normal   Conjunctiva/Sclera White and quiet White and quiet   Cornea Clear Clear   Anterior Chamber Deep and quiet Deep and quiet   Iris Round and reactive Round and reactive   Lens  Centered posterior chamber intraocular lens   Anterior Vitreous Normal Normal       Fundus Exam      Right Left   Posterior Vitreous  Posterior vitreous detachment   Disc  Peripapillary atrophy,    C/D Ratio  0.55   Macula  Macular thickening, Subretinal neovascular membrane pigmented, Subretinal fibrosis, no hemorrhage, Hard drusen, Retinal pigment epithelial atrophy - geographic   Vessels  Normal   Periphery  Normal          IMAGING AND PROCEDURES  Imaging and Procedures for 07/06/20  OCT, Retina - OU - Both Eyes       Right Eye Quality was borderline. Scan locations included subfoveal. Progression has been stable. Findings include central retinal atrophy, outer retinal atrophy, subretinal scarring.   Left Eye **Note De-Identified [place] Obfuscation** Quality was borderline. Scan locations included subfoveal. Progression has been stable. Findings include subretinal fluid, choroidal neovascular membrane,  epiretinal membrane.   Notes OD with central scarring from dry ARMD, myopic macular degeneration, increase atrophy over the last 3 years from prior OCT evaluation  OS with less active CNVM inferonasal and a second lesion superior to the fovea, incidental epiretinal membrane noted left eye, nonfoveal distorting, current currently at 8-week follow-up.  We will repeat injection today examination next in 10 weeks                 ASSESSMENT/PLAN:  Exudative age-related macular degeneration of left eye with active choroidal neovascularization ([place]) Active in the past, now extending the interval of examination to 8 weeks still stabilized with evidence of intraretinal fluid lingering, subfoveal pigmented lesion noted clinically.  We will repeat injection Avastin today and extend examined nation interval to 10 weeks  Advanced nonexudative age-related macular degeneration of right eye with subfoveal involvement Accounts for visual acuity OD      ICD-10-CM   1. Exudative age-related macular degeneration of left eye with active choroidal neovascularization (HCC)  H35.3221 OCT, Retina - OU - Both Eyes  2. Advanced nonexudative age-related macular degeneration of right eye with subfoveal involvement  H35.3114     1.  OS, with CNVM and reactivated in the past.  However examination intervals now have been extended to 8 weeks with stability of findings and acuity.  We will repeat injection OS today and examination next in 10 weeks  2.  3.  Ophthalmic Meds Ordered this visit:  No orders of the defined types were placed in this encounter.      Return in about 10 weeks (around 09/14/2020) for dilate, OS, AVASTIN OCT.  There are no Patient Instructions on file for this visit.   Explained the diagnoses, plan, and follow up with the patient and they expressed understanding.  Patient expressed understanding of the importance of proper follow up care.   Clent Demark Emara Lichter M.D. Diseases & Surgery  of the Retina and Vitreous Retina & Diabetic [place] 07/06/20     Abbreviations: M myopia (nearsighted); A astigmatism; H hyperopia (farsighted); P presbyopia; Mrx spectacle prescription;  CTL contact lenses; OD right eye; OS left eye; OU both eyes  XT exotropia; ET esotropia; PEK punctate epithelial keratitis; PEE punctate epithelial erosions; DES dry eye syndrome; MGD meibomian gland dysfunction; ATs artificial tears; PFAT's preservative free artificial tears; [place] nuclear sclerotic cataract; PSC posterior subcapsular cataract; ERM epi-retinal membrane; PVD posterior vitreous detachment; RD retinal detachment; DM diabetes mellitus; DR diabetic retinopathy; NPDR non-proliferative diabetic retinopathy; PDR proliferative diabetic retinopathy; CSME clinically significant macular edema; DME diabetic macular edema; dbh dot blot hemorrhages; CWS cotton wool spot; POAG primary open angle glaucoma; C/D cup-to-disc ratio; HVF humphrey visual field; GVF goldmann visual field; OCT optical coherence tomography; IOP intraocular pressure; BRVO Branch retinal vein occlusion; CRVO central retinal vein occlusion; CRAO central retinal artery occlusion; BRAO branch retinal artery occlusion; RT retinal tear; SB scleral buckle; PPV pars plana vitrectomy; VH Vitreous hemorrhage; PRP panretinal laser photocoagulation; IVK intravitreal kenalog; VMT vitreomacular traction; MH Macular hole;  NVD neovascularization of the disc; NVE neovascularization elsewhere; AREDS age related eye disease study; ARMD age related macular degeneration; POAG primary open angle glaucoma; EBMD epithelial/anterior basement membrane dystrophy; ACIOL anterior chamber intraocular lens; IOL intraocular lens; PCIOL posterior chamber intraocular lens; Phaco/IOL phacoemulsification with intraocular lens placement; PRK photorefractive keratectomy; LASIK laser assisted in situ keratomileusis; HTN hypertension; DM diabetes **Note De-Identified [place] Obfuscation** mellitus; COPD chronic obstructive  pulmonary disease

## 2020-09-14 ENCOUNTER — Encounter (INDEPENDENT_AMBULATORY_CARE_PROVIDER_SITE_OTHER): Payer: Medicare Other | Admitting: Ophthalmology

## 2020-09-22 ENCOUNTER — Other Ambulatory Visit: Payer: BLUE CROSS/BLUE SHIELD

## 2020-09-22 ENCOUNTER — Other Ambulatory Visit: Payer: Self-pay

## 2020-09-22 DIAGNOSIS — D696 Thrombocytopenia, unspecified: Secondary | ICD-10-CM | POA: Diagnosis not present

## 2020-09-22 DIAGNOSIS — E559 Vitamin D deficiency, unspecified: Secondary | ICD-10-CM | POA: Diagnosis not present

## 2020-09-22 DIAGNOSIS — N200 Calculus of kidney: Secondary | ICD-10-CM | POA: Diagnosis not present

## 2020-09-22 DIAGNOSIS — E6609 Other obesity due to excess calories: Secondary | ICD-10-CM | POA: Diagnosis not present

## 2020-09-22 DIAGNOSIS — I129 Hypertensive chronic kidney disease with stage 1 through stage 4 chronic kidney disease, or unspecified chronic kidney disease: Secondary | ICD-10-CM | POA: Diagnosis not present

## 2020-09-29 ENCOUNTER — Encounter (INDEPENDENT_AMBULATORY_CARE_PROVIDER_SITE_OTHER): Payer: Medicare Other | Admitting: Ophthalmology

## 2020-10-01 ENCOUNTER — Encounter (INDEPENDENT_AMBULATORY_CARE_PROVIDER_SITE_OTHER): Payer: Self-pay | Admitting: Ophthalmology

## 2020-10-01 ENCOUNTER — Other Ambulatory Visit: Payer: Self-pay | Admitting: Family Medicine

## 2020-10-01 ENCOUNTER — Other Ambulatory Visit: Payer: Self-pay

## 2020-10-01 ENCOUNTER — Ambulatory Visit (INDEPENDENT_AMBULATORY_CARE_PROVIDER_SITE_OTHER): Payer: PPO | Admitting: Ophthalmology

## 2020-10-01 DIAGNOSIS — H35372 Puckering of macula, left eye: Secondary | ICD-10-CM | POA: Diagnosis not present

## 2020-10-01 DIAGNOSIS — H353221 Exudative age-related macular degeneration, left eye, with active choroidal neovascularization: Secondary | ICD-10-CM | POA: Diagnosis not present

## 2020-10-01 DIAGNOSIS — I10 Essential (primary) hypertension: Secondary | ICD-10-CM

## 2020-10-01 MED ORDER — BEVACIZUMAB 2.5 MG/0.1ML IZ SOSY
2.5000 mg | PREFILLED_SYRINGE | INTRAVITREAL | Status: AC | PRN
  Administered 2020-10-01: 2.5 mg via INTRAVITREAL

## 2020-10-01 NOTE — Assessment & Plan Note (Signed)
**Note De-Identified [place] Obfuscation** Minor OS, no active impact on acuity

## 2020-10-01 NOTE — Progress Notes (Signed)
**Note De-Identified Perman Obfuscation** 10/01/2020     CHIEF COMPLAINT Patient presents for Retina Follow Up (12 week fu os and Avastin OS/Pt states VA OU stable since last visit. Pt denies FOL, floaters, or ocular pain OU. /I do feel like my depth perception gets a little worse./)   HISTORY OF PRESENT ILLNESS: Daniel Brock is a 75 y.o. male who presents to the clinic today for:   HPI     Retina Follow Up           Diagnosis: Wet AMD   Laterality: left eye   Onset: 12 weeks ago   Severity: mild   Duration: 12 weeks   Course: stable   Comments: 12 week fu os and Avastin OS Pt states VA OU stable since last visit. Pt denies FOL, floaters, or ocular pain OU.  I do feel like my depth perception gets a little worse.        Last edited by Kendra Opitz, Moore on 10/01/2020  3:14 PM.      Referring physician: Dettinger, Fransisca Kaufmann, MD [place],  [place] [id number]  HISTORICAL INFORMATION:   Selected notes from the MEDICAL RECORD NUMBER    Lab Results  Component Value Date   HGBA1C 5.5 05/04/2020     CURRENT MEDICATIONS: No current outpatient medications on file. (Ophthalmic Drugs)   No current facility-administered medications for this visit. (Ophthalmic Drugs)   Current Outpatient Medications (Other)  Medication Sig   allopurinol (ZYLOPRIM) 100 MG tablet Take 1 tablet (100 mg total) by mouth 2 (two) times daily.   colchicine 0.6 MG tablet Take 1 tablet (0.6 mg total) by mouth daily. May use up to 2 a day on the initial day (Patient taking differently: Take 0.6 mg by mouth as needed. May use up to 2 a day on the initial day)   cyclobenzaprine (FLEXERIL) 10 MG tablet Take 1 tablet (10 mg total) by mouth 3 (three) times daily as needed for muscle spasms.   losartan (COZAAR) 25 MG tablet Take 1 tablet (25 mg total) by mouth daily.   OVER THE COUNTER MEDICATION Eye vitamin   rosuvastatin (CRESTOR) 20 MG tablet Take 1 tablet (20 mg total) by mouth daily.   sertraline (ZOLOFT) 100 MG tablet Take 0.5  tablets (50 mg total) by mouth daily.   tamsulosin (FLOMAX) 0.4 MG CAPS capsule Take 2 capsules (0.8 mg total) by mouth daily.   No current facility-administered medications for this visit. (Other)      REVIEW OF SYSTEMS:    ALLERGIES Allergies  Allergen Reactions   Ciprofloxacin Nausea And Vomiting   Ciprofloxacin Hcl     REACTION: NAUSIA AND VOMITING   Sulfamethoxazole-Trimethoprim     REACTION: nausea and vomiting   Sulfonamide Derivatives     REACTION: itching   Sulphur [Elemental Sulfur] Rash    PAST MEDICAL HISTORY Past Medical History:  Diagnosis Date   Allergy    Anxiety    Cataract    Chronic foot pain    Chronic kidney disease    Chronic leg pain    Depression 2016   Gout    Hiatal hernia    Hyperlipidemia    Hypertension    Renal insufficiency    Varicose veins    Past Surgical History:  Procedure Laterality Date   CARPAL TUNNEL RELEASE Left    CARPAL TUNNEL RELEASE Bilateral    CHOLECYSTECTOMY  2026   COLONOSCOPY     COLONOSCOPY W/ POLYPECTOMY **Note De-Identified [place] Obfuscation** EYE SURGERY Bilateral 1958, 2004   cataracts   INGUINAL HERNIA REPAIR     JOINT REPLACEMENT  2002   TOTAL KNEE ARTHROPLASTY Right 2002   ulnar intrapment release  07/2008   VARICOSE VEIN SURGERY Left 1980's per patient   Dr. Maurene Capes    FAMILY HISTORY Family History  Problem Relation Age of Onset   Hypertension Mother    Hyperlipidemia Mother    Arthritis Mother    Heart disease Mother    Huntington's disease Mother    Cancer Father        pancreatic cancer   Arthritis Father    Diabetes Father    Hypertension Father    Hyperlipidemia Sister    Hypertension Sister    Hypertension Brother    Vision loss Brother    Huntington's disease Brother    Heart disease Maternal Grandmother    Early death Maternal Grandfather    Colon cancer Neg Hx    Esophageal cancer Neg Hx    Rectal cancer Neg Hx    Stomach cancer Neg Hx     SOCIAL HISTORY Social History   Tobacco Use   Smoking status:  Never   Smokeless tobacco: Never  Vaping Use   Vaping Use: Never used  Substance Use Topics   Alcohol use: No   Drug use: No         OPHTHALMIC EXAM:  Base Eye Exam     Visual Acuity (ETDRS)       Right Left   Dist [place] 20/400 20/200   Dist ph [place] 20/200 20/80 -1         Tonometry (Tonopen, 3:20 PM)       Right Left   Pressure 15 15         Pupils       Pupils Dark Light Shape React APD   Right PERRL 6 5 Round Brisk None   Left PERRL 5 4 Round Brisk None         Visual Fields (Counting fingers)       Left Right    Full Full         Extraocular Movement       Right Left    Full Full         Neuro/Psych     Oriented x3: Yes   Mood/Affect: Normal         Dilation     Left eye: 1.0% Mydriacyl, 2.5% Phenylephrine @ 3:20 PM           Slit Lamp and Fundus Exam     External Exam       Right Left   External Normal Normal         Slit Lamp Exam       Right Left   Lids/Lashes Normal Normal   Conjunctiva/Sclera White and quiet White and quiet   Cornea Clear Clear   Anterior Chamber Deep and quiet Deep and quiet   Iris Round and reactive Round and reactive   Lens  Centered posterior chamber intraocular lens   Anterior Vitreous Normal Normal         Fundus Exam       Right Left   Posterior Vitreous  Posterior vitreous detachment   Disc  Peripapillary atrophy,    C/D Ratio  0.55   Macula  Macular thickening, Subretinal neovascular membrane pigmented, Subretinal fibrosis, no hemorrhage, Hard drusen, Retinal pigment epithelial atrophy - geographic   Vessels  Normal **Note De-Identified Fullen Obfuscation** Periphery  Normal            IMAGING AND PROCEDURES  Imaging and Procedures for 10/01/20  OCT, Retina - OU - Both Eyes       Right Eye Quality was borderline. Scan locations included subfoveal. Progression has been stable. Findings include central retinal atrophy, outer retinal atrophy, subretinal scarring.   Left Eye Quality was borderline. Scan  locations included subfoveal. Progression has been stable. Findings include subretinal fluid, choroidal neovascular membrane, epiretinal membrane, intraretinal fluid.   Notes OD with central scarring from dry ARMD, myopic macular degeneration, increase atrophy over the last 3 years from prior OCT evaluation  OS with slightly more active CNVM inferonasal and a second lesion superior to the fovea, incidental epiretinal membrane noted left eye, nonfoveal distorting, current currently at 12-week follow-up.  We will repeat injection today examination next in 8 weeks      Intravitreal Injection, Pharmacologic Agent - OS - Left Eye       Time Out 10/01/2020. 3:52 PM. Confirmed correct patient, procedure, site, and patient consented.   Anesthesia Topical anesthesia was used. Anesthetic medications included Akten 3.5%.   Procedure Preparation included Tobramycin 0.3%, 10% betadine to eyelids, 5% betadine to ocular surface. A 30 gauge needle was used.   Injection: 2.5 mg bevacizumab 2.5 MG/0.1ML   Route: Intravitreal, Site: Left Eye   NDC: [phone]   Post-op Post injection exam found visual acuity of at least counting fingers. The patient tolerated the procedure well. There were no complications. The patient received written and verbal post procedure care education. Post injection medications were not given.              ASSESSMENT/PLAN:  Exudative age-related macular degeneration of left eye with active choroidal neovascularization ([place]) Planned injection around 10-week interval left eye delayed due to social factors.  Now with intraretinal fluid at 12-week interval.  We will repeat injection Avastin today and shorten interval to follow-up to 8 weeks to determine if there is any improvement  Left epiretinal membrane Minor OS, no active impact on acuity     ICD-10-CM   1. Exudative age-related macular degeneration of left eye with active choroidal neovascularization (HCC)   H35.3221 OCT, Retina - OU - Both Eyes    Intravitreal Injection, Pharmacologic Agent - OS - Left Eye    bevacizumab (AVASTIN) SOSY 2.5 mg    2. Left epiretinal membrane  H35.372       1.  Increased CME and intraretinal fluid left eye from CNVM for macular degeneration.  At 12-week interval.  Plan was 10-week interval.  We will repeat injection today to recover and improved macular anatomy, will shorten interval follow-up next OS to 8 weeks  2.  3.  Ophthalmic Meds Ordered this visit:  Meds ordered this encounter  Medications   bevacizumab (AVASTIN) SOSY 2.5 mg       Return in about 8 weeks (around 11/26/2020) for dilate, OS, AVASTIN OCT.  There are no Patient Instructions on file for this visit.   Explained the diagnoses, plan, and follow up with the patient and they expressed understanding.  Patient expressed understanding of the importance of proper follow up care.   Clent Demark Marlin Jarrard M.D. Diseases & Surgery of the Retina and Vitreous [facility] [place] 10/01/20     Abbreviations: M myopia (nearsighted); A astigmatism; H hyperopia (farsighted); P presbyopia; Mrx spectacle prescription;  CTL contact lenses; OD right eye; OS left eye; OU both eyes  XT **Note De-Identified [place] Obfuscation** exotropia; ET esotropia; PEK punctate epithelial keratitis; PEE punctate epithelial erosions; DES dry eye syndrome; MGD meibomian gland dysfunction; ATs artificial tears; PFAT's preservative free artificial tears; [place] nuclear sclerotic cataract; PSC posterior subcapsular cataract; ERM epi-retinal membrane; PVD posterior vitreous detachment; RD retinal detachment; DM diabetes mellitus; DR diabetic retinopathy; NPDR non-proliferative diabetic retinopathy; PDR proliferative diabetic retinopathy; CSME clinically significant macular edema; DME diabetic macular edema; dbh dot blot hemorrhages; CWS cotton wool spot; POAG primary open angle glaucoma; C/D cup-to-disc ratio; HVF humphrey visual field; GVF goldmann visual field; OCT  optical coherence tomography; IOP intraocular pressure; BRVO Branch retinal vein occlusion; CRVO central retinal vein occlusion; CRAO central retinal artery occlusion; BRAO branch retinal artery occlusion; RT retinal tear; SB scleral buckle; PPV pars plana vitrectomy; VH Vitreous hemorrhage; PRP panretinal laser photocoagulation; IVK intravitreal kenalog; VMT vitreomacular traction; MH Macular hole;  NVD neovascularization of the disc; NVE neovascularization elsewhere; AREDS age related eye disease study; ARMD age related macular degeneration; POAG primary open angle glaucoma; EBMD epithelial/anterior basement membrane dystrophy; ACIOL anterior chamber intraocular lens; IOL intraocular lens; PCIOL posterior chamber intraocular lens; Phaco/IOL phacoemulsification with intraocular lens placement; [place] photorefractive keratectomy; LASIK laser assisted in situ keratomileusis; HTN hypertension; DM diabetes mellitus; COPD chronic obstructive pulmonary disease

## 2020-10-01 NOTE — Assessment & Plan Note (Signed)
**Note De-Identified Jergens Obfuscation** Planned injection around 10-week interval left eye delayed due to social factors.  Now with intraretinal fluid at 12-week interval.  We will repeat injection Avastin today and shorten interval to follow-up to 8 weeks to determine if there is any improvement

## 2020-10-07 DIAGNOSIS — N200 Calculus of kidney: Secondary | ICD-10-CM | POA: Diagnosis not present

## 2020-10-07 DIAGNOSIS — E6609 Other obesity due to excess calories: Secondary | ICD-10-CM | POA: Diagnosis not present

## 2020-10-07 DIAGNOSIS — I129 Hypertensive chronic kidney disease with stage 1 through stage 4 chronic kidney disease, or unspecified chronic kidney disease: Secondary | ICD-10-CM | POA: Diagnosis not present

## 2020-10-07 DIAGNOSIS — E559 Vitamin D deficiency, unspecified: Secondary | ICD-10-CM | POA: Diagnosis not present

## 2020-10-07 DIAGNOSIS — D696 Thrombocytopenia, unspecified: Secondary | ICD-10-CM | POA: Diagnosis not present

## 2020-10-30 ENCOUNTER — Encounter: Payer: Self-pay | Admitting: Family Medicine

## 2020-10-30 ENCOUNTER — Other Ambulatory Visit: Payer: Self-pay

## 2020-10-30 ENCOUNTER — Ambulatory Visit (INDEPENDENT_AMBULATORY_CARE_PROVIDER_SITE_OTHER): Payer: PPO | Admitting: Family Medicine

## 2020-10-30 VITALS — BP 145/87 | HR 63 | Ht 71.0 in | Wt 245.0 lb

## 2020-10-30 DIAGNOSIS — N1832 Chronic kidney disease, stage 3b: Secondary | ICD-10-CM

## 2020-10-30 DIAGNOSIS — E781 Pure hyperglyceridemia: Secondary | ICD-10-CM

## 2020-10-30 DIAGNOSIS — R7303 Prediabetes: Secondary | ICD-10-CM | POA: Diagnosis not present

## 2020-10-30 DIAGNOSIS — N5089 Other specified disorders of the male genital organs: Secondary | ICD-10-CM

## 2020-10-30 DIAGNOSIS — I10 Essential (primary) hypertension: Secondary | ICD-10-CM

## 2020-10-30 DIAGNOSIS — E782 Mixed hyperlipidemia: Secondary | ICD-10-CM | POA: Diagnosis not present

## 2020-10-30 LAB — CMP14+EGFR
ALT: 14 IU/L (ref 0–44)
AST: 16 IU/L (ref 0–40)
Albumin/Globulin Ratio: 2.8 — ABNORMAL HIGH (ref 1.2–2.2)
Albumin: 4.5 g/dL (ref 3.7–4.7)
Alkaline Phosphatase: 103 IU/L (ref 44–121)
BUN/Creatinine Ratio: 13 (ref 10–24)
BUN: 19 mg/dL (ref 8–27)
Bilirubin Total: 0.4 mg/dL (ref 0.0–1.2)
CO2: 24 mmol/L (ref 20–29)
Calcium: 8.9 mg/dL (ref 8.6–10.2)
Chloride: 105 mmol/L (ref 96–106)
Creatinine, Ser: 1.44 mg/dL — ABNORMAL HIGH (ref 0.76–1.27)
Globulin, Total: 1.6 g/dL (ref 1.5–4.5)
Glucose: 109 mg/dL — ABNORMAL HIGH (ref 65–99)
Potassium: 4.6 mmol/L (ref 3.5–5.2)
Sodium: 144 mmol/L (ref 134–144)
Total Protein: 6.1 g/dL (ref 6.0–8.5)
eGFR: 50 mL/min/{1.73_m2} — ABNORMAL LOW (ref >59)

## 2020-10-30 LAB — CBC WITH DIFFERENTIAL/PLATELET
Basophils Absolute: 0 10*3/uL (ref 0.0–0.2)
Basos: 1 %
EOS (ABSOLUTE): 0.1 10*3/uL (ref 0.0–0.4)
Eos: 3 %
Hematocrit: 45.8 % (ref 37.5–51.0)
Hemoglobin: 14.9 g/dL (ref 13.0–17.7)
Immature Grans (Abs): 0 10*3/uL (ref 0.0–0.1)
Immature Granulocytes: 0 %
Lymphocytes Absolute: 1.1 10*3/uL (ref 0.7–3.1)
Lymphs: 24 %
MCH: 28.1 pg (ref 26.6–33.0)
MCHC: 32.5 g/dL (ref 31.5–35.7)
MCV: 86 fL (ref 79–97)
Monocytes Absolute: 0.4 10*3/uL (ref 0.1–0.9)
Monocytes: 7 %
Neutrophils Absolute: 3.1 10*3/uL (ref 1.4–7.0)
Neutrophils: 65 %
Platelets: 124 10*3/uL — ABNORMAL LOW (ref 150–450)
RBC: 5.31 x10E6/uL (ref 4.14–5.80)
RDW: 13.4 % (ref 11.6–15.4)
WBC: 4.7 10*3/uL (ref 3.4–10.8)

## 2020-10-30 LAB — LIPID PANEL
Chol/HDL Ratio: 4.8 ratio (ref 0.0–5.0)
Cholesterol, Total: 179 mg/dL (ref 100–199)
HDL: 37 mg/dL — ABNORMAL LOW (ref >39)
LDL Chol Calc (NIH): 105 mg/dL — ABNORMAL HIGH (ref 0–99)
Triglycerides: 216 mg/dL — ABNORMAL HIGH (ref 0–149)
VLDL Cholesterol Cal: 37 mg/dL (ref 5–40)

## 2020-10-30 LAB — BAYER DCA HB A1C WAIVED: HB A1C (BAYER DCA - WAIVED): 5.6 % (ref <7.0)

## 2020-10-30 MED ORDER — CITALOPRAM HYDROBROMIDE 40 MG PO TABS: 40.0000 mg | ORAL_TABLET | Freq: Every day | ORAL | 3 refills | Status: DC

## 2020-10-30 MED ORDER — LOSARTAN POTASSIUM 25 MG PO TABS: 25.0000 mg | ORAL_TABLET | Freq: Every day | ORAL | 3 refills | Status: DC

## 2020-10-30 NOTE — Progress Notes (Signed)
**Note De-Identified Lie[place] Obfuscation** BP (!) 145/87   Pulse 63   Ht 5' 11"  (1.803 m)   Wt 245 lb (111.1 kg)   SpO2 98%   BMI 34.17 kg/m    Subjective:   Patient ID: Daniel Brock, male    DOB: [birth year], 75 y.o.   MRN: [id number]  HPI: Daniel Brock is a 75 y.o. male presenting on 10/30/2020 for Medical Management of Chronic Issues   HPI Patient is coming in complaining of an enlarged right testicle nightly for more than 6 months but he just noticed that he started having some pain with this.  He wants to go see his urologist.  He has had a surgery on a hydrocele before and is concerned because the pain is having now.  Hypertension Patient is currently on losartan, and their blood pressure today is 145/87. Patient denies any lightheadedness or dizziness. Patient denies headaches, blurred vision, chest pains, shortness of breath, or weakness. Denies any side effects from medication and is content with current medication.   Hyperlipidemia and hypertriglyceridemia Patient is coming in for recheck of his hyperlipidemia. The patient is currently taking Crestor. They deny any issues with myalgias or history of liver damage from it. They deny any focal numbness or weakness or chest pain.   Prediabetes Patient comes in today for recheck of his diabetes. Patient has been currently taking no medication has been doing diet control. Patient is currently on an ACE inhibitor/ARB. Patient has not seen an ophthalmologist this year. Patient denies any issues with their feet. The symptom started onset as an adult hyperlipidemia-ARE RELATED TO DM   Patient is coming in for anxiety recheck.  He says he takes of his wife Celexa and other forms would like to switch from Zoloft to the Celexa. Depression screen [facility] 2/9 10/30/2020 05/04/2020 10/03/2019 07/04/2019 05/22/2019  Decreased Interest 3 1 3 3  0  Down, Depressed, Hopeless 0 1 3 0 0  PHQ - 2 Score 3 2 6 3  0  Altered sleeping 0 - 1 1 -  Tired, decreased energy 3 - 3 3 -  Change in  appetite 0 - 0 0 -  Feeling bad or failure about yourself  2 - 0 0 -  Trouble concentrating 0 - 0 0 -  Moving slowly or fidgety/restless 0 - 0 0 -  Suicidal thoughts 0 - 0 0 -  PHQ-9 Score 8 - 10 7 -  Difficult doing work/chores - - - Somewhat difficult -     Relevant past medical, surgical, family and social history reviewed and updated as indicated. Interim medical history since our last visit reviewed. Allergies and medications reviewed and updated.  Review of Systems  Constitutional:  Negative for chills and fever.  Eyes:  Negative for visual disturbance.  Respiratory:  Negative for shortness of breath and wheezing.   Cardiovascular:  Negative for chest pain and leg swelling.  Genitourinary:  Positive for scrotal swelling.  Musculoskeletal:  Negative for back pain and gait problem.  Skin:  Negative for rash.  Neurological:  Negative for dizziness, weakness and light-headedness.  All other systems reviewed and are negative.  Per HPI unless specifically indicated above   Allergies as of 10/30/2020       Reactions   Ciprofloxacin Nausea And Vomiting   Ciprofloxacin Hcl    REACTION: NAUSIA AND VOMITING   Sulfamethoxazole-trimethoprim    REACTION: nausea and vomiting   Sulfonamide Derivatives    REACTION: itching   Sulphur [elemental Sulfur] Rash **Note De-Identified [place]elson Obfuscation** Medication List        Accurate as of October 30, 2020 11:27 AM. If you have any questions, ask your nurse or doctor.          STOP taking these medications    sertraline 100 MG tablet Commonly known as: ZOLOFT Stopped by: Fransisca Kaufmann Leonell Lobdell, MD       TAKE these medications    allopurinol 100 MG tablet Commonly known as: ZYLOPRIM Take 1 tablet (100 mg total) by mouth 2 (two) times daily.   citalopram 40 MG tablet Commonly known as: CELEXA Take 1 tablet (40 mg total) by mouth daily. Started by: Fransisca Kaufmann Shyla Gayheart, MD   colchicine 0.6 MG tablet Take 1 tablet (0.6 mg total) by mouth daily. May use up  to 2 a day on the initial day What changed:  when to take this reasons to take this   cyclobenzaprine 10 MG tablet Commonly known as: FLEXERIL Take 1 tablet (10 mg total) by mouth 3 (three) times daily as needed for muscle spasms.   losartan 25 MG tablet Commonly known as: COZAAR Take 1 tablet (25 mg total) by mouth daily.   OVER THE COUNTER MEDICATION Eye vitamin   rosuvastatin 20 MG tablet Commonly known as: CRESTOR Take 1 tablet (20 mg total) by mouth daily.   tamsulosin 0.4 MG Caps capsule Commonly known as: FLOMAX Take 2 capsules (0.8 mg total) by mouth daily.         Objective:   BP (!) 145/87   Pulse 63   Ht 5' 11"  (1.803 m)   Wt 245 lb (111.1 kg)   SpO2 98%   BMI 34.17 kg/m   Wt Readings from Last 3 Encounters:  10/30/20 245 lb (111.1 kg)  05/04/20 237 lb (107.5 kg)  10/03/19 232 lb (105.2 kg)    Physical Exam Vitals and nursing note reviewed.  Constitutional:      General: He is not in acute distress.    Appearance: He is well-developed. He is not diaphoretic.  Eyes:     General: No scleral icterus.    Conjunctiva/sclera: Conjunctivae normal.  Neck:     Thyroid: No thyromegaly.  Cardiovascular:     Rate and Rhythm: Normal rate and regular rhythm.     Heart sounds: Normal heart sounds. No murmur heard. Pulmonary:     Effort: Pulmonary effort is normal. No respiratory distress.     Breath sounds: Normal breath sounds. No wheezing.  Genitourinary:    Testes:        Right: Swelling and testicular hydrocele (Soft and likely hydrocele) present. Tenderness not present.        Left: Tenderness or swelling not present. Left testis is descended.  Musculoskeletal:     Cervical back: Neck supple.  Lymphadenopathy:     Cervical: No cervical adenopathy.  Skin:    General: Skin is warm and dry.     Findings: No rash.  Neurological:     Mental Status: He is alert and oriented to person, place, and time.     Coordination: Coordination normal.   Psychiatric:        Behavior: Behavior normal.    Results for orders placed or performed in visit on 05/04/20  CBC with Differential/Platelet  Result Value Ref Range   WBC 5.0 3.4 - 10.8 x10E3/uL   RBC 5.58 4.14 - 5.80 x10E6/uL   Hemoglobin 15.7 13.0 - 17.7 g/dL   Hematocrit 47.7 37.5 - 51.0 %   MCV 86 79 - **Note De-Identified Brandl Obfuscation** 97 fL   MCH 28.1 26.6 - 33.0 pg   MCHC 32.9 31.5 - 35.7 g/dL   RDW 13.6 11.6 - 15.4 %   Platelets 135 (L) 150 - 450 x10E3/uL   Neutrophils 64 Not Estab. %   Lymphs 24 Not Estab. %   Monocytes 8 Not Estab. %   Eos 2 Not Estab. %   Basos 1 Not Estab. %   Neutrophils Absolute 3.2 1.4 - 7.0 x10E3/uL   Lymphocytes Absolute 1.2 0.7 - 3.1 x10E3/uL   Monocytes Absolute 0.4 0.1 - 0.9 x10E3/uL   EOS (ABSOLUTE) 0.1 0.0 - 0.4 x10E3/uL   Basophils Absolute 0.0 0.0 - 0.2 x10E3/uL   Immature Granulocytes 1 Not Estab. %   Immature Grans (Abs) 0.0 0.0 - 0.1 x10E3/uL  Lipid panel  Result Value Ref Range   Cholesterol, Total 197 100 - 199 mg/dL   Triglycerides 327 (H) 0 - 149 mg/dL   HDL 38 (L) >39 mg/dL   VLDL Cholesterol Cal 56 (H) 5 - 40 mg/dL   LDL Chol Calc (NIH) 103 (H) 0 - 99 mg/dL   Chol/HDL Ratio 5.2 (H) 0.0 - 5.0 ratio  Bayer DCA Hb A1c Waived  Result Value Ref Range   HB A1C (BAYER DCA - WAIVED) 5.5 <7.0 %  Renal function panel  Result Value Ref Range   Glucose 103 (H) 65 - 99 mg/dL   BUN 21 8 - 27 mg/dL   Creatinine, Ser 1.42 (H) 0.76 - 1.27 mg/dL   GFR calc non Af Amer 48 (L) >59 mL/min/1.73   GFR calc Af Amer 55 (L) >59 mL/min/1.73   BUN/Creatinine Ratio 15 10 - 24   Sodium 146 (H) 134 - 144 mmol/L   Potassium 4.7 3.5 - 5.2 mmol/L   Chloride 106 96 - 106 mmol/L   CO2 22 20 - 29 mmol/L   Calcium 9.0 8.6 - 10.2 mg/dL   Phosphorus 3.8 2.8 - 4.1 mg/dL   Albumin 4.4 3.7 - 4.7 g/dL  PTH, Intact and Calcium  Result Value Ref Range   PTH 54 15 - 65 pg/mL   PTH Interp Comment     Assessment & Plan:   Problem List Items Addressed This Visit       Cardiovascular  and Mediastinum   Essential hypertension - Primary   Relevant Medications   losartan (COZAAR) 25 MG tablet     Genitourinary   CKD (chronic kidney disease), stage III (HCC)   Relevant Orders   CBC with Differential/Platelet   CMP14+EGFR     Other   HLD (hyperlipidemia)   Relevant Medications   losartan (COZAAR) 25 MG tablet   Other Relevant Orders   Lipid panel   Pre-diabetes   Relevant Orders   Bayer DCA Hb A1c Waived   Hypertriglyceridemia   Relevant Medications   losartan (COZAAR) 25 MG tablet   Other Relevant Orders   Lipid panel   Other Visit Diagnoses     Enlarged testicle       Relevant Orders   Ambulatory referral to Urology       Refer to urology for hydrocele, will check blood work on the way out.  Continue current medication except for we did switch to Celexa instead of the Zoloft at patient's request Follow up plan: Return in about 6 months (around 05/02/2021), or if symptoms worsen or fail to improve, for Physical and hypertension and cholesterol.  Counseling provided for all of the vaccine components Orders Placed This Encounter **Note De-Identified Bernier Obfuscation** Procedures   Bayer DCA Hb A1c Waived   CBC with Differential/Platelet   CMP14+EGFR   Lipid panel   Ambulatory referral to Urology    Caryl Pina, MD [facility] 10/30/2020, 11:27 AM

## 2020-11-05 ENCOUNTER — Other Ambulatory Visit: Payer: Self-pay

## 2020-11-05 MED ORDER — EZETIMIBE 10 MG PO TABS: 10.0000 mg | ORAL_TABLET | Freq: Every day | ORAL | 3 refills | Status: DC

## 2020-11-24 ENCOUNTER — Ambulatory Visit (INDEPENDENT_AMBULATORY_CARE_PROVIDER_SITE_OTHER): Payer: PPO | Admitting: *Deleted

## 2020-11-24 VITALS — BP 114/74 | HR 71 | Ht 71.0 in

## 2020-11-24 DIAGNOSIS — Z0130 Encounter for examination of blood pressure without abnormal findings: Secondary | ICD-10-CM

## 2020-11-24 NOTE — Progress Notes (Signed)
**Note De-Identified Dutkiewicz Obfuscation** Pt walked into the office c/o on and off chest pain x 3 days. PT denies headache, blurred vision, weakness, dizziness, numbness. I spoke with the covering provider for Dr Dettinger as he is out of the office and she says pt should go to ED to be evaluated for possible cardiac workup due to pt age and other health related complications. Pt is stable and understands provider feedback and I offered to call EMS or his wife but pt declined both stating he would drive himself.

## 2020-11-26 ENCOUNTER — Ambulatory Visit (INDEPENDENT_AMBULATORY_CARE_PROVIDER_SITE_OTHER): Payer: PPO | Admitting: Ophthalmology

## 2020-11-26 ENCOUNTER — Other Ambulatory Visit: Payer: Self-pay

## 2020-11-26 DIAGNOSIS — H353221 Exudative age-related macular degeneration, left eye, with active choroidal neovascularization: Secondary | ICD-10-CM

## 2020-11-26 MED ORDER — BEVACIZUMAB 2.5 MG/0.1ML IZ SOSY
2.5000 mg | PREFILLED_SYRINGE | INTRAVITREAL | Status: AC | PRN
  Administered 2020-11-26: 2.5 mg via INTRAVITREAL

## 2020-11-26 NOTE — Progress Notes (Signed)
**Note De-Identified [place] [facility]bfuscation** 11/26/2020     CHIEF COMPLAINT Patient presents for  Chief Complaint  Patient presents with   Retina Follow Up      HISTORY OF PRESENT ILLNESS: Daniel Brock is a 75 y.o. male who presents to the clinic today for:   HPI     Retina Follow Up   Patient presents with  Wet AMD.  In left eye.  This started 8.  Severity is mild.  Duration of 8 weeks.        Comments   8 week f/u OS with OCT and possible Avastin  Pt denies any visual changes since previous visit.   Eye Meds: None      Last edited by Reather Littler, COA on 11/26/2020  4:29 PM.      Referring physician: Dettinger, Fransisca Kaufmann, MD [place],  [place] [id number]  HISTORICAL INFORMATION:   Selected notes from the MEDICAL RECORD NUMBER    Lab Results  Component Value Date   HGBA1C 5.6 10/30/2020     CURRENT MEDICATIONS: No current outpatient medications on file. (Ophthalmic Drugs)   No current facility-administered medications for this visit. (Ophthalmic Drugs)   Current Outpatient Medications (Other)  Medication Sig   allopurinol (ZYLOPRIM) 100 MG tablet Take 1 tablet (100 mg total) by mouth 2 (two) times daily.   citalopram (CELEXA) 40 MG tablet Take 1 tablet (40 mg total) by mouth daily.   colchicine 0.6 MG tablet Take 1 tablet (0.6 mg total) by mouth daily. May use up to 2 a day on the initial day (Patient taking differently: Take 0.6 mg by mouth as needed. May use up to 2 a day on the initial day)   cyclobenzaprine (FLEXERIL) 10 MG tablet Take 1 tablet (10 mg total) by mouth 3 (three) times daily as needed for muscle spasms.   ezetimibe (ZETIA) 10 MG tablet Take 1 tablet (10 mg total) by mouth daily.   losartan (COZAAR) 25 MG tablet Take 1 tablet (25 mg total) by mouth daily.   OVER THE COUNTER MEDICATION Eye vitamin   rosuvastatin (CRESTOR) 20 MG tablet Take 1 tablet (20 mg total) by mouth daily.   tamsulosin (FLOMAX) 0.4 MG CAPS capsule Take 2 capsules (0.8 mg total) by mouth daily.    No current facility-administered medications for this visit. (Other)      REVIEW OF SYSTEMS:    ALLERGIES Allergies  Allergen Reactions   Ciprofloxacin Nausea And Vomiting   Ciprofloxacin Hcl     REACTION: NAUSIA AND VOMITING   Sulfamethoxazole-Trimethoprim     REACTION: nausea and vomiting   Sulfonamide Derivatives     REACTION: itching   Sulphur [Elemental Sulfur] Rash    PAST MEDICAL HISTORY Past Medical History:  Diagnosis Date   Allergy    Anxiety    Cataract    Chronic foot pain    Chronic kidney disease    Chronic leg pain    Depression 2016   Gout    Hiatal hernia    Hyperlipidemia    Hypertension    Renal insufficiency    Varicose veins    Past Surgical History:  Procedure Laterality Date   CARPAL TUNNEL RELEASE Left    CARPAL TUNNEL RELEASE Bilateral    CHOLECYSTECTOMY  2026   COLONOSCOPY     COLONOSCOPY W/ POLYPECTOMY     EYE SURGERY Bilateral 1958, 2004   cataracts   INGUINAL HERNIA REPAIR     JOINT REPLACEMENT  2002 **Note De-Identified [place] [facility]bfuscation** TOTAL KNEE ARTHROPLASTY Right 2002   ulnar intrapment release  07/2008   VARICOSE VEIN SURGERY Left 1980's per patient   Dr. Maurene Capes    FAMILY HISTORY Family History  Problem Relation Age of Onset   Hypertension Mother    Hyperlipidemia Mother    Arthritis Mother    Heart disease Mother    Huntington's disease Mother    Cancer Father        pancreatic cancer   Arthritis Father    Diabetes Father    Hypertension Father    Hyperlipidemia Sister    Hypertension Sister    Hypertension Brother    Vision loss Brother    Huntington's disease Brother    Heart disease Maternal Grandmother    Early death Maternal Grandfather    Colon cancer Neg Hx    Esophageal cancer Neg Hx    Rectal cancer Neg Hx    Stomach cancer Neg Hx     SOCIAL HISTORY Social History   Tobacco Use   Smoking status: Never   Smokeless tobacco: Never  Vaping Use   Vaping Use: Never used  Substance Use Topics   Alcohol use: No   Drug use:  No         OPHTHALMIC EXAM:  Base Eye Exam     Visual Acuity (ETDRS)       Right Left   Dist [place] 20/150 20/80 +1   Dist ph [place] 20/100 -1 20/60         Tonometry (Tonopen, 3:57 PM)       Right Left   Pressure 10 9         Pupils       Dark Light Shape React APD   Right 6 5.5 Round Minimal None   Left 5 4 Round Minimal None         Visual Fields (Counting fingers)       Left Right    Full Full         Extraocular Movement       Right Left    Esotropia Esotropia    -- -- --  --  --  -- -- --   -- -- --  --  --  -- -- --           Neuro/Psych     Oriented x3: Yes   Mood/Affect: Normal         Dilation     Left eye: 1.0% Mydriacyl, 2.5% Phenylephrine @ 3:57 PM           Slit Lamp and Fundus Exam     External Exam       Right Left   External Normal Normal         Slit Lamp Exam       Right Left   Lids/Lashes Normal Normal   Conjunctiva/Sclera White and quiet White and quiet   Cornea Clear Clear   Anterior Chamber Deep and quiet Deep and quiet   Iris Round and reactive Round and reactive   Lens  Centered posterior chamber intraocular lens   Anterior Vitreous Normal Normal         Fundus Exam       Right Left   Posterior Vitreous  Posterior vitreous detachment   Disc  Peripapillary atrophy,    C/D Ratio  0.55   Macula  Macular thickening, Subretinal neovascular membrane pigmented, Subretinal fibrosis, no hemorrhage, Hard drusen, Retinal pigment epithelial atrophy - geographic **Note De-Identified [place] Obfuscation** Vessels  Normal   Periphery  Normal            IMAGING AND PROCEDURES  Imaging and Procedures for 11/26/20  OCT, Retina - OU - Both Eyes       Right Eye Quality was borderline. Scan locations included subfoveal. Central Foveal Thickness: 272. Progression has been stable. Findings include central retinal atrophy, outer retinal atrophy, subretinal scarring.   Left Eye Quality was borderline. Scan locations included subfoveal.  Central Foveal Thickness: 411. Progression has been stable. Findings include subretinal fluid, choroidal neovascular membrane, epiretinal membrane, intraretinal fluid.   Notes OD with central scarring from dry ARMD, myopic macular degeneration, increase atrophy over the last 3 years from prior OCT evaluation  OS with slightly more active CNVM inferonasal and a second lesion superior to the fovea, incidental epiretinal membrane noted left eye, nonfoveal distorting, current currently at 12-week follow-up.  We will repeat injection today examination next in 8 weeks      Intravitreal Injection, Pharmacologic Agent - OS - Left Eye       Time Out 11/26/2020. 4:43 PM. Confirmed correct patient, procedure, site, and patient consented.   Anesthesia Topical anesthesia was used. Anesthetic medications included [place] 3.5%.   Procedure Preparation included Tobramycin 0.3%, 10% betadine to eyelids, 5% betadine to ocular surface. A 30 gauge needle was used.   Injection: 2.5 mg bevacizumab 2.5 MG/0.1ML   Route: Intravitreal, Site: Left Eye   NDC: [phone]   Post-op Post injection exam found visual acuity of at least counting fingers. The patient tolerated the procedure well. There were no complications. The patient received written and verbal post procedure care education. Post injection medications were not given.              ASSESSMENT/PLAN:  Exudative age-related macular degeneration of left eye with active choroidal neovascularization (HCC) Improved overall at 8-week interval with less intraretinal fluid and coincident improved acuity as compared to 12-week interval in the past.      ICD-10-CM   1. Exudative age-related macular degeneration of left eye with active choroidal neovascularization (HCC)  H35.3221 OCT, Retina - OU - Both Eyes    Intravitreal Injection, Pharmacologic Agent - OS - Left Eye    bevacizumab (AVASTIN) SOSY 2.5 mg      1.  Repeat intravitreal Avastin OS  today and examination again in 8 weeks  2.  3.  Ophthalmic Meds Ordered this visit:  Meds ordered this encounter  Medications   bevacizumab (AVASTIN) SOSY 2.5 mg       Return in about 8 weeks (around 01/21/2021) for dilate, OS, AVASTIN OCT.  There are no Patient Instructions on file for this visit.   Explained the diagnoses, plan, and follow up with the patient and they expressed understanding.  Patient expressed understanding of the importance of proper follow up care.   Clent Demark Malikah Lakey M.D. Diseases & Surgery of the Retina and Vitreous [facility] [place] 11/26/20     Abbreviations: M myopia (nearsighted); A astigmatism; H hyperopia (farsighted); P presbyopia; Mrx spectacle prescription;  CTL contact lenses; OD right eye; OS left eye; OU both eyes  XT exotropia; ET esotropia; PEK punctate epithelial keratitis; PEE punctate epithelial erosions; DES dry eye syndrome; MGD meibomian gland dysfunction; ATs artificial tears; PFAT's preservative free artificial tears; [place] nuclear sclerotic cataract; PSC posterior subcapsular cataract; ERM epi-retinal membrane; PVD posterior vitreous detachment; RD retinal detachment; DM diabetes mellitus; DR diabetic retinopathy; NPDR non-proliferative diabetic retinopathy; PDR proliferative diabetic retinopathy; **Note De-Identified [place] Obfuscation** CSME clinically significant macular edema; DME diabetic macular edema; dbh dot blot hemorrhages; CWS cotton wool spot; POAG primary open angle glaucoma; C/D cup-to-disc ratio; HVF humphrey visual field; GVF goldmann visual field; OCT optical coherence tomography; IOP intraocular pressure; BRVO Branch retinal vein occlusion; CRVO central retinal vein occlusion; CRAO central retinal artery occlusion; BRAO branch retinal artery occlusion; RT retinal tear; SB scleral buckle; PPV pars plana vitrectomy; VH Vitreous hemorrhage; PRP panretinal laser photocoagulation; IVK intravitreal kenalog; VMT vitreomacular traction; MH Macular hole;  NVD  neovascularization of the disc; NVE neovascularization elsewhere; AREDS age related eye disease study; ARMD age related macular degeneration; POAG primary open angle glaucoma; EBMD epithelial/anterior basement membrane dystrophy; ACIOL anterior chamber intraocular lens; IOL intraocular lens; PCIOL posterior chamber intraocular lens; Phaco/IOL phacoemulsification with intraocular lens placement; [place] photorefractive keratectomy; LASIK laser assisted in situ keratomileusis; HTN hypertension; DM diabetes mellitus; COPD chronic obstructive pulmonary disease

## 2020-11-26 NOTE — Assessment & Plan Note (Signed)
**Note De-Identified [place] [facility]bfuscation** Improved overall at 8-week interval with less intraretinal fluid and coincident improved acuity as compared to 12-week interval in the past.

## 2020-12-09 ENCOUNTER — Ambulatory Visit (INDEPENDENT_AMBULATORY_CARE_PROVIDER_SITE_OTHER): Payer: PPO | Admitting: Family Medicine

## 2020-12-09 ENCOUNTER — Other Ambulatory Visit: Payer: Self-pay

## 2020-12-09 ENCOUNTER — Encounter: Payer: Self-pay | Admitting: Family Medicine

## 2020-12-09 VITALS — BP 94/56 | HR 92 | Ht 71.0 in | Wt 245.0 lb

## 2020-12-09 DIAGNOSIS — M109 Gout, unspecified: Secondary | ICD-10-CM

## 2020-12-09 MED ORDER — COLCHICINE 0.6 MG PO TABS: 0.6000 mg | ORAL_TABLET | Freq: Every day | ORAL | 2 refills | Status: DC | PRN

## 2020-12-09 MED ORDER — METHYLPREDNISOLONE ACETATE 40 MG/ML IJ SUSP
80.0000 mg | Freq: Once | INTRAMUSCULAR | Status: AC
  Administered 2020-12-09: 80 mg via INTRAMUSCULAR

## 2020-12-09 NOTE — Progress Notes (Signed)
**Note De-Identified [name]** BP (!) 94/56   Pulse 92   Ht 5' 11"  (1.803 m)   Wt 245 lb (111.1 kg)   SpO2 97%   BMI 34.17 kg/m    Subjective:   Patient ID: Daniel Brock, male    DOB: [birth year], 75 y.o.   MRN: [id number]  HPI: Daniel Brock is a 75 y.o. male presenting on 12/09/2020 for Gout (Flared for days. Left foot)   HPI Left toe gout Patient is coming today for gout in the left toes/foot that flared up over the past couple days.  He has not had a gout flare like this in a long time.  He did not know if he can take the colchicine because of his kidney and did not have anything to take at home except for he did take a pain pill that he had that was really old.  He says it hurts very significantly swollen and is red.  Similar to when he has had gout before.  Relevant past medical, surgical, family and social history reviewed and updated as indicated. Interim medical history since our last visit reviewed. Allergies and medications reviewed and updated.  Review of Systems  Constitutional:  Negative for chills and fever.  Respiratory:  Negative for shortness of breath and wheezing.   Cardiovascular:  Negative for chest pain and leg swelling.  Musculoskeletal:  Positive for arthralgias and joint swelling.  Skin:  Positive for color change. Negative for rash.  All other systems reviewed and are negative.  Per HPI unless specifically indicated above   Allergies as of 12/09/2020       Reactions   Ciprofloxacin Nausea And Vomiting   Ciprofloxacin Hcl    REACTION: NAUSIA AND VOMITING   Sulfamethoxazole-trimethoprim    REACTION: nausea and vomiting   Sulfonamide Derivatives    REACTION: itching   Sulphur [elemental Sulfur] Rash        Medication List        Accurate as of December 09, 2020 10:05 AM. If you have any questions, ask your nurse or doctor.          allopurinol 100 MG tablet Commonly known as: ZYLOPRIM Take 1 tablet (100 mg total) by mouth 2 (two) times daily.   citalopram  40 MG tablet Commonly known as: CELEXA Take 1 tablet (40 mg total) by mouth daily.   colchicine 0.6 MG tablet Take 1 tablet (0.6 mg total) by mouth daily as needed. What changed:  when to take this reasons to take this additional instructions Changed by: Fransisca Kaufmann Jamiya Nims, MD   cyclobenzaprine 10 MG tablet Commonly known as: FLEXERIL Take 1 tablet (10 mg total) by mouth 3 (three) times daily as needed for muscle spasms.   ezetimibe 10 MG tablet Commonly known as: Zetia Take 1 tablet (10 mg total) by mouth daily.   HYDROcodone-ibuprofen 7.5-200 MG tablet Commonly known as: VICOPROFEN Take 1 tablet by mouth every 8 (eight) hours as needed for moderate pain.   losartan 25 MG tablet Commonly known as: COZAAR Take 1 tablet (25 mg total) by mouth daily.   OVER THE COUNTER MEDICATION Eye vitamin   rosuvastatin 20 MG tablet Commonly known as: CRESTOR Take 1 tablet (20 mg total) by mouth daily.   tamsulosin 0.4 MG Caps capsule Commonly known as: FLOMAX Take 2 capsules (0.8 mg total) by mouth daily.         Objective:   BP (!) 94/56   Pulse 92   Ht 5' 11"  (1.803 **Note De-Identified [place] Obfuscation** m)   Wt 245 lb (111.1 kg)   SpO2 97%   BMI 34.17 kg/m   Wt Readings from Last 3 Encounters:  12/09/20 245 lb (111.1 kg)  10/30/20 245 lb (111.1 kg)  05/04/20 237 lb (107.5 kg)    Physical Exam Vitals and nursing note reviewed.  Musculoskeletal:     Left foot: Normal range of motion. Swelling and tenderness present. No bony tenderness or crepitus.      Assessment & Plan:   Problem List Items Addressed This Visit       Other   Gout - Primary   Relevant Medications   colchicine 0.6 MG tablet   methylPREDNISolone acetate (DEPO-MEDROL) injection 80 mg   Other Relevant Orders   BMP8+EGFR   Uric acid    Will treat acutely, discuss allopurinol if has future flares Follow up plan: Return if symptoms worsen or fail to improve.  Counseling provided for all of the vaccine components Orders  Placed This Encounter  Procedures   BMP8+EGFR   Uric acid    Caryl Pina, MD [facility] 12/09/2020, 10:05 AM

## 2020-12-10 LAB — BMP8+EGFR
BUN/Creatinine Ratio: 12 (ref 10–24)
BUN: 18 mg/dL (ref 8–27)
CO2: 22 mmol/L (ref 20–29)
Calcium: 9.3 mg/dL (ref 8.6–10.2)
Chloride: 106 mmol/L (ref 96–106)
Creatinine, Ser: 1.52 mg/dL — ABNORMAL HIGH (ref 0.76–1.27)
Glucose: 138 mg/dL — ABNORMAL HIGH (ref 65–99)
Potassium: 4.6 mmol/L (ref 3.5–5.2)
Sodium: 147 mmol/L — ABNORMAL HIGH (ref 134–144)
eGFR: 47 mL/min/{1.73_m2} — ABNORMAL LOW (ref >59)

## 2020-12-10 LAB — URIC ACID: Uric Acid: 5.6 mg/dL (ref 3.8–8.4)

## 2021-01-21 ENCOUNTER — Encounter (INDEPENDENT_AMBULATORY_CARE_PROVIDER_SITE_OTHER): Payer: PPO | Admitting: Ophthalmology

## 2021-01-27 ENCOUNTER — Other Ambulatory Visit: Payer: PPO

## 2021-01-27 ENCOUNTER — Other Ambulatory Visit: Payer: Self-pay

## 2021-01-27 DIAGNOSIS — I129 Hypertensive chronic kidney disease with stage 1 through stage 4 chronic kidney disease, or unspecified chronic kidney disease: Secondary | ICD-10-CM | POA: Diagnosis not present

## 2021-01-27 DIAGNOSIS — D696 Thrombocytopenia, unspecified: Secondary | ICD-10-CM | POA: Diagnosis not present

## 2021-01-27 DIAGNOSIS — E6609 Other obesity due to excess calories: Secondary | ICD-10-CM | POA: Diagnosis not present

## 2021-01-27 DIAGNOSIS — N200 Calculus of kidney: Secondary | ICD-10-CM | POA: Diagnosis not present

## 2021-01-27 DIAGNOSIS — E559 Vitamin D deficiency, unspecified: Secondary | ICD-10-CM | POA: Diagnosis not present

## 2021-02-02 ENCOUNTER — Encounter (INDEPENDENT_AMBULATORY_CARE_PROVIDER_SITE_OTHER): Payer: Self-pay | Admitting: Ophthalmology

## 2021-02-02 ENCOUNTER — Ambulatory Visit (INDEPENDENT_AMBULATORY_CARE_PROVIDER_SITE_OTHER): Payer: PPO | Admitting: Ophthalmology

## 2021-02-02 ENCOUNTER — Other Ambulatory Visit: Payer: Self-pay

## 2021-02-02 DIAGNOSIS — R0683 Snoring: Secondary | ICD-10-CM

## 2021-02-02 DIAGNOSIS — H353123 Nonexudative age-related macular degeneration, left eye, advanced atrophic without subfoveal involvement: Secondary | ICD-10-CM | POA: Diagnosis not present

## 2021-02-02 DIAGNOSIS — H353114 Nonexudative age-related macular degeneration, right eye, advanced atrophic with subfoveal involvement: Secondary | ICD-10-CM | POA: Diagnosis not present

## 2021-02-02 DIAGNOSIS — H353221 Exudative age-related macular degeneration, left eye, with active choroidal neovascularization: Secondary | ICD-10-CM

## 2021-02-02 DIAGNOSIS — H35372 Puckering of macula, left eye: Secondary | ICD-10-CM

## 2021-02-02 MED ORDER — BEVACIZUMAB 2.5 MG/0.1ML IZ SOSY
2.5000 mg | PREFILLED_SYRINGE | INTRAVITREAL | Status: AC | PRN
Start: 2021-02-02 — End: 2021-02-02
  Administered 2021-02-02: 2.5 mg via INTRAVITREAL

## 2021-02-02 NOTE — Progress Notes (Signed)
**Note De-Identified [place] Obfuscation** 02/02/2021     CHIEF COMPLAINT Patient presents for  Chief Complaint  Patient presents with   Retina Follow Up      HISTORY OF PRESENT ILLNESS: Daniel Brock is a 75 y.o. male who presents to the clinic today for:   HPI     Retina Follow Up   Patient presents with  Wet AMD.  In left eye.  This started 9.5 weeks ago.  Severity is mild.  Duration of 9.5 weeks.        Comments   9 weeks 5 days fu OS oct avastin OS. Allergy to ciprofloxacin Patient states vision is stable and unchanged since last visit. Denies any new floaters or FOL.       Last edited by Laurin Coder on 02/02/2021  1:37 PM.      Referring physician: Dettinger, Fransisca Kaufmann, MD [place],  [place] [address]  HISTORICAL INFORMATION:   Selected notes from the MEDICAL RECORD NUMBER    Lab Results  Component Value Date   HGBA1C 5.6 10/30/2020     CURRENT MEDICATIONS: No current outpatient medications on file. (Ophthalmic Drugs)   No current facility-administered medications for this visit. (Ophthalmic Drugs)   Current Outpatient Medications (Other)  Medication Sig   allopurinol (ZYLOPRIM) 100 MG tablet Take 1 tablet (100 mg total) by mouth 2 (two) times daily.   citalopram (CELEXA) 40 MG tablet Take 1 tablet (40 mg total) by mouth daily.   colchicine 0.6 MG tablet Take 1 tablet (0.6 mg total) by mouth daily as needed.   cyclobenzaprine (FLEXERIL) 10 MG tablet Take 1 tablet (10 mg total) by mouth 3 (three) times daily as needed for muscle spasms.   ezetimibe (ZETIA) 10 MG tablet Take 1 tablet (10 mg total) by mouth daily.   HYDROcodone-ibuprofen (VICOPROFEN) 7.5-200 MG tablet Take 1 tablet by mouth every 8 (eight) hours as needed for moderate pain.   losartan (COZAAR) 25 MG tablet Take 1 tablet (25 mg total) by mouth daily.   OVER THE COUNTER MEDICATION Eye vitamin   rosuvastatin (CRESTOR) 20 MG tablet Take 1 tablet (20 mg total) by mouth daily.   tamsulosin (FLOMAX) 0.4 MG CAPS  capsule Take 2 capsules (0.8 mg total) by mouth daily.   No current facility-administered medications for this visit. (Other)      REVIEW OF SYSTEMS:    ALLERGIES Allergies  Allergen Reactions   Ciprofloxacin Nausea And Vomiting   Ciprofloxacin Hcl     REACTION: NAUSIA AND VOMITING   Sulfamethoxazole-Trimethoprim     REACTION: nausea and vomiting   Sulfonamide Derivatives     REACTION: itching   Sulphur [Elemental Sulfur] Rash    PAST MEDICAL HISTORY Past Medical History:  Diagnosis Date   Allergy    Anxiety    Cataract    Chronic foot pain    Chronic kidney disease    Chronic leg pain    Depression 2016   Gout    Hiatal hernia    Hyperlipidemia    Hypertension    Renal insufficiency    Varicose veins    Past Surgical History:  Procedure Laterality Date   CARPAL TUNNEL RELEASE Left    CARPAL TUNNEL RELEASE Bilateral    CHOLECYSTECTOMY  2026   COLONOSCOPY     COLONOSCOPY W/ POLYPECTOMY     EYE SURGERY Bilateral 1958, 2004   cataracts   INGUINAL HERNIA REPAIR     JOINT REPLACEMENT  2002   TOTAL **Note De-Identified [place] Obfuscation** KNEE ARTHROPLASTY Right 2002   ulnar intrapment release  07/2008   VARICOSE VEIN SURGERY Left 1980's per patient   Dr. Maurene Capes    FAMILY HISTORY Family History  Problem Relation Age of Onset   Hypertension Mother    Hyperlipidemia Mother    Arthritis Mother    Heart disease Mother    Huntington's disease Mother    Cancer Father        pancreatic cancer   Arthritis Father    Diabetes Father    Hypertension Father    Hyperlipidemia Sister    Hypertension Sister    Hypertension Brother    Vision loss Brother    Huntington's disease Brother    Heart disease Maternal Grandmother    Early death Maternal Grandfather    Colon cancer Neg Hx    Esophageal cancer Neg Hx    Rectal cancer Neg Hx    Stomach cancer Neg Hx     SOCIAL HISTORY Social History   Tobacco Use   Smoking status: Never   Smokeless tobacco: Never  Vaping Use   Vaping Use: Never used   Substance Use Topics   Alcohol use: No   Drug use: No         OPHTHALMIC EXAM:  Base Eye Exam     Visual Acuity (ETDRS)       Right Left   Dist [place] 20/100 -1 20/100 -1   Dist ph [place] NI 20/70         Tonometry (Tonopen, 1:42 PM)       Right Left   Pressure 11 15         Pupils       Pupils Dark Light Shape APD   Right PERRL 6 5 Round None   Left PERRL 5 4 Round None         Extraocular Movement       Right Left    Full Full  Esotropia        Neuro/Psych     Oriented x3: Yes   Mood/Affect: Normal         Dilation     Left eye: 1.0% Mydriacyl, 2.5% Phenylephrine @ 1:42 PM           Slit Lamp and Fundus Exam     External Exam       Right Left   External Normal Normal         Slit Lamp Exam       Right Left   Lids/Lashes Normal Normal   Conjunctiva/Sclera White and quiet White and quiet   Cornea Clear Clear   Anterior Chamber Deep and quiet Deep and quiet   Iris Round and reactive Round and reactive   Lens  Centered posterior chamber intraocular lens   Anterior Vitreous Normal Normal         Fundus Exam       Right Left   Posterior Vitreous  Posterior vitreous detachment   Disc  Peripapillary atrophy,    C/D Ratio  0.55   Macula  Macular thickening, Subretinal neovascular membrane pigmented, Subretinal fibrosis, no hemorrhage, Hard drusen, Retinal pigment epithelial atrophy - geographic, myopic type atrophy   Vessels  Normal   Periphery  Normal            IMAGING AND PROCEDURES  Imaging and Procedures for 02/02/21  Intravitreal Injection, Pharmacologic Agent - OS - Left Eye       Time Out 02/02/2021. 2:20 PM. Confirmed correct **Note De-Identified [place] Obfuscation** patient, procedure, site, and patient consented.   Anesthesia Topical anesthesia was used. Anesthetic medications included Lidocaine 4%.   Procedure Preparation included Tobramycin 0.3%, 10% betadine to eyelids, 5% betadine to ocular surface. A 30 gauge needle was used.    Injection: 2.5 mg bevacizumab 2.5 MG/0.1ML   Route: Intravitreal, Site: Left Eye   NDC: [phone], Lot: [id number]   Post-op Post injection exam found visual acuity of at least counting fingers. The patient tolerated the procedure well. There were no complications. The patient received written and verbal post procedure care education. Post injection medications were not given.      OCT, Retina - OU - Both Eyes       Right Eye Quality was borderline. Scan locations included subfoveal. Central Foveal Thickness: 272. Progression has been stable. Findings include central retinal atrophy, outer retinal atrophy, subretinal scarring.   Left Eye Quality was borderline. Scan locations included subfoveal. Central Foveal Thickness: 411. Progression has been stable. Findings include choroidal neovascular membrane, epiretinal membrane, intraretinal fluid, cystoid macular edema.   Notes OD with central scarring from dry ARMD, myopic macular degeneration, increase atrophy over the last 3 years from prior OCT evaluation  OS with slightly more active CNVM inferonasal and a second lesion superior to the fovea, incidental epiretinal membrane noted left eye, nonfoveal distorting, current currently at 9-week follow-up.  We will repeat injection today examination next in 8 weeks              ASSESSMENT/PLAN:  Advanced nonexudative age-related macular degeneration of left eye without subfoveal involvement Geographic atrophy encroaching upon central acuity  Advanced nonexudative age-related macular degeneration of right eye with subfoveal involvement Geographic atrophy in the fovea  Left epiretinal membrane OS minor  Exudative age-related macular degeneration of left eye with active choroidal neovascularization (HCC) Chronic active and recurrent fluid at 12-week interval now with persistent CME at  9-week interval-week, repeat injection today and follow-up again in 7-week  Snores Patient  has review of systems positive for sleep apnea and I have encouraged him to seek home testing evaluation as arranged through his PCP.  If sleep apnea is present I encouraged him to commence with therapy so as to minimize the risk of nightly damage to his eyes and his brain from the hypoxic and hypertensive episodic changes which occur with untreated sleep apnea     ICD-10-CM   1. Exudative age-related macular degeneration of left eye with active choroidal neovascularization (HCC)  H35.3221 Intravitreal Injection, Pharmacologic Agent - OS - Left Eye    OCT, Retina - OU - Both Eyes    bevacizumab (AVASTIN) SOSY 2.5 mg    2. Advanced nonexudative age-related macular degeneration of left eye without subfoveal involvement  H35.3123     3. Advanced nonexudative age-related macular degeneration of right eye with subfoveal involvement  H35.3114     4. Left epiretinal membrane  H35.372     5. Snores  R06.83       1.  OS we will repeat injection today at 9-week interval for CNVM still active with intraretinal fluid.  Follow-up next in 7 weeks  2.  Patient has a positive view of system for snoring and with nocturia as well which she attributes to prostate problems.  Encouraged him however to seek home sleep study testing through his PCP and if sleep apnea is detected to treat it so as to minimize nightly hypoxic damage to his eyes  3.  Ophthalmic Meds Ordered this visit:  Meds ordered **Note De-Identified [place] Obfuscation** this encounter  Medications   bevacizumab (AVASTIN) SOSY 2.5 mg       Return in about 7 weeks (around 03/23/2021) for DILATE OU, AVASTIN OCT, OS.  There are no Patient Instructions on file for this visit.   Explained the diagnoses, plan, and follow up with the patient and they expressed understanding.  Patient expressed understanding of the importance of proper follow up care.   Clent Demark Mikaelah Trostle M.D. Diseases & Surgery of the Retina and Vitreous Retina & Diabetic [place] 02/02/21     Abbreviations: M myopia (nearsighted); A astigmatism; H hyperopia (farsighted); P presbyopia; Mrx spectacle prescription;  CTL contact lenses; OD right eye; OS left eye; OU both eyes  XT exotropia; ET esotropia; PEK punctate epithelial keratitis; PEE punctate epithelial erosions; DES dry eye syndrome; MGD meibomian gland dysfunction; ATs artificial tears; PFAT's preservative free artificial tears; [place] nuclear sclerotic cataract; PSC posterior subcapsular cataract; ERM epi-retinal membrane; PVD posterior vitreous detachment; RD retinal detachment; DM diabetes mellitus; DR diabetic retinopathy; NPDR non-proliferative diabetic retinopathy; PDR proliferative diabetic retinopathy; CSME clinically significant macular edema; DME diabetic macular edema; dbh dot blot hemorrhages; CWS cotton wool spot; POAG primary open angle glaucoma; C/D cup-to-disc ratio; HVF humphrey visual field; GVF goldmann visual field; OCT optical coherence tomography; IOP intraocular pressure; BRVO Branch retinal vein occlusion; CRVO central retinal vein occlusion; CRAO central retinal artery occlusion; BRAO branch retinal artery occlusion; RT retinal tear; SB scleral buckle; PPV pars plana vitrectomy; VH Vitreous hemorrhage; PRP panretinal laser photocoagulation; IVK intravitreal kenalog; VMT vitreomacular traction; MH Macular hole;  NVD neovascularization of the disc; NVE neovascularization elsewhere; AREDS age related eye disease study; ARMD age related macular degeneration; POAG primary open angle glaucoma; EBMD epithelial/anterior basement membrane dystrophy; ACIOL anterior chamber intraocular lens; IOL intraocular lens; PCIOL posterior chamber intraocular lens; Phaco/IOL phacoemulsification with intraocular lens placement; [place] photorefractive keratectomy; LASIK laser assisted in situ keratomileusis; HTN hypertension; DM diabetes mellitus; COPD chronic obstructive pulmonary disease

## 2021-02-02 NOTE — Assessment & Plan Note (Signed)
**Note De-Identified [place] Obfuscation** Geographic atrophy in the fovea

## 2021-02-02 NOTE — Assessment & Plan Note (Signed)
**Note De-Identified [place] Obfuscation** Geographic atrophy encroaching upon central acuity

## 2021-02-02 NOTE — Assessment & Plan Note (Signed)
**Note De-Identified [place] Obfuscation** Patient has review of systems positive for sleep apnea and I have encouraged him to seek home testing evaluation as arranged through his PCP.  If sleep apnea is present I encouraged him to commence with therapy so as to minimize the risk of nightly damage to his eyes and his brain from the hypoxic and hypertensive episodic changes which occur with untreated sleep apnea

## 2021-02-02 NOTE — Assessment & Plan Note (Signed)
**Note De-Identified [place] Obfuscation** Chronic active and recurrent fluid at 12-week interval now with persistent CME at  9-week interval-week, repeat injection today and follow-up again in 7-week

## 2021-02-02 NOTE — Assessment & Plan Note (Signed)
**Note De-Identified [place] Obfuscation** OS minor

## 2021-02-16 DIAGNOSIS — I129 Hypertensive chronic kidney disease with stage 1 through stage 4 chronic kidney disease, or unspecified chronic kidney disease: Secondary | ICD-10-CM | POA: Diagnosis not present

## 2021-02-16 DIAGNOSIS — E559 Vitamin D deficiency, unspecified: Secondary | ICD-10-CM | POA: Diagnosis not present

## 2021-02-16 DIAGNOSIS — N200 Calculus of kidney: Secondary | ICD-10-CM | POA: Diagnosis not present

## 2021-02-16 DIAGNOSIS — E6609 Other obesity due to excess calories: Secondary | ICD-10-CM | POA: Diagnosis not present

## 2021-03-03 DIAGNOSIS — N4341 Spermatocele of epididymis, single: Secondary | ICD-10-CM | POA: Diagnosis not present

## 2021-03-03 DIAGNOSIS — N401 Enlarged prostate with lower urinary tract symptoms: Secondary | ICD-10-CM | POA: Diagnosis not present

## 2021-03-03 DIAGNOSIS — R3914 Feeling of incomplete bladder emptying: Secondary | ICD-10-CM | POA: Diagnosis not present

## 2021-03-23 ENCOUNTER — Ambulatory Visit (INDEPENDENT_AMBULATORY_CARE_PROVIDER_SITE_OTHER): Payer: PPO | Admitting: Ophthalmology

## 2021-03-23 ENCOUNTER — Other Ambulatory Visit: Payer: Self-pay

## 2021-03-23 ENCOUNTER — Encounter (INDEPENDENT_AMBULATORY_CARE_PROVIDER_SITE_OTHER): Payer: Self-pay | Admitting: Ophthalmology

## 2021-03-23 DIAGNOSIS — H353114 Nonexudative age-related macular degeneration, right eye, advanced atrophic with subfoveal involvement: Secondary | ICD-10-CM

## 2021-03-23 DIAGNOSIS — H353221 Exudative age-related macular degeneration, left eye, with active choroidal neovascularization: Secondary | ICD-10-CM

## 2021-03-23 MED ORDER — BEVACIZUMAB 2.5 MG/0.1ML IZ SOSY
2.5000 mg | PREFILLED_SYRINGE | INTRAVITREAL | Status: AC | PRN
  Administered 2021-03-23: 2.5 mg via INTRAVITREAL

## 2021-03-23 NOTE — Progress Notes (Signed)
**Note De-Identified [place] Obfuscation** 03/23/2021     CHIEF COMPLAINT Patient presents for  Chief Complaint  Patient presents with   Macular Degeneration      HISTORY OF PRESENT ILLNESS: Daniel Brock is a 76 y.o. male who presents to the clinic today for:   HPI   OS today follow-up at 7-week interval post injection Avastin for follow-up of wet AMD.  OU with a history of myopic macular degeneration dry atrophic macular degeneration Last edited by Hurman Horn, MD on 03/23/2021  2:51 PM.      Referring physician: Dettinger, Fransisca Kaufmann, MD [place],  [place] [id number]  HISTORICAL INFORMATION:   Selected notes from the MEDICAL RECORD NUMBER    Lab Results  Component Value Date   HGBA1C 5.6 10/30/2020     CURRENT MEDICATIONS: No current outpatient medications on file. (Ophthalmic Drugs)   No current facility-administered medications for this visit. (Ophthalmic Drugs)   Current Outpatient Medications (Other)  Medication Sig   allopurinol (ZYLOPRIM) 100 MG tablet Take 1 tablet (100 mg total) by mouth 2 (two) times daily.   citalopram (CELEXA) 40 MG tablet Take 1 tablet (40 mg total) by mouth daily.   colchicine 0.6 MG tablet Take 1 tablet (0.6 mg total) by mouth daily as needed.   cyclobenzaprine (FLEXERIL) 10 MG tablet Take 1 tablet (10 mg total) by mouth 3 (three) times daily as needed for muscle spasms.   ezetimibe (ZETIA) 10 MG tablet Take 1 tablet (10 mg total) by mouth daily.   HYDROcodone-ibuprofen (VICOPROFEN) 7.5-200 MG tablet Take 1 tablet by mouth every 8 (eight) hours as needed for moderate pain.   losartan (COZAAR) 25 MG tablet Take 1 tablet (25 mg total) by mouth daily.   OVER THE COUNTER MEDICATION Eye vitamin   rosuvastatin (CRESTOR) 20 MG tablet Take 1 tablet (20 mg total) by mouth daily.   tamsulosin (FLOMAX) 0.4 MG CAPS capsule Take 2 capsules (0.8 mg total) by mouth daily.   No current facility-administered medications for this visit. (Other)      REVIEW OF SYSTEMS: ROS    Negative for: Constitutional, Gastrointestinal, Neurological, Skin, Genitourinary, Musculoskeletal, HENT, Endocrine, Cardiovascular, Eyes, Respiratory, Psychiatric, Allergic/Imm, Heme/Lymph Last edited by Hurman Horn, MD on 03/23/2021  2:51 PM.       ALLERGIES Allergies  Allergen Reactions   Ciprofloxacin Nausea And Vomiting   Ciprofloxacin Hcl     REACTION: NAUSIA AND VOMITING   Sulfamethoxazole-Trimethoprim     REACTION: nausea and vomiting   Sulfonamide Derivatives     REACTION: itching   Sulphur [Elemental Sulfur] Rash    PAST MEDICAL HISTORY Past Medical History:  Diagnosis Date   Allergy    Anxiety    Cataract    Chronic foot pain    Chronic kidney disease    Chronic leg pain    Depression 2016   Gout    Hiatal hernia    Hyperlipidemia    Hypertension    Renal insufficiency    Varicose veins    Past Surgical History:  Procedure Laterality Date   CARPAL TUNNEL RELEASE Left    CARPAL TUNNEL RELEASE Bilateral    CHOLECYSTECTOMY  2026   COLONOSCOPY     COLONOSCOPY W/ POLYPECTOMY     EYE SURGERY Bilateral 1958, 2004   cataracts   INGUINAL HERNIA REPAIR     JOINT REPLACEMENT  2002   TOTAL KNEE ARTHROPLASTY Right 2002   ulnar intrapment release  07/2008   VARICOSE VEIN **Note De-Identified [place] Obfuscation** SURGERY Left 1980's per patient   Dr. Maurene Capes    FAMILY HISTORY Family History  Problem Relation Age of Onset   Hypertension Mother    Hyperlipidemia Mother    Arthritis Mother    Heart disease Mother    Huntington's disease Mother    Cancer Father        pancreatic cancer   Arthritis Father    Diabetes Father    Hypertension Father    Hyperlipidemia Sister    Hypertension Sister    Hypertension Brother    Vision loss Brother    Huntington's disease Brother    Heart disease Maternal Grandmother    Early death Maternal Grandfather    Colon cancer Neg Hx    Esophageal cancer Neg Hx    Rectal cancer Neg Hx    Stomach cancer Neg Hx     SOCIAL HISTORY Social History   Tobacco  Use   Smoking status: Never   Smokeless tobacco: Never  Vaping Use   Vaping Use: Never used  Substance Use Topics   Alcohol use: No   Drug use: No         OPHTHALMIC EXAM:  Base Eye Exam     Visual Acuity (ETDRS)       Right Left   Dist [place] 20/150 20/80   Dist ph [place] 20/100 -1 NI         Tonometry (Tonopen, 2:30 PM)       Right Left   Pressure 12 10         Pupils       Pupils APD   Right PERRL None   Left PERRL None         Visual Fields       Left Right    Full Full         Neuro/Psych     Oriented x3: Yes   Mood/Affect: Normal         Dilation     Left eye: 1.0% Mydriacyl, 2.5% Phenylephrine @ 2:30 PM           Slit Lamp and Fundus Exam     External Exam       Right Left   External Normal Normal         Slit Lamp Exam       Right Left   Lids/Lashes Normal Normal   Conjunctiva/Sclera White and quiet White and quiet   Cornea Clear Clear   Anterior Chamber Deep and quiet Deep and quiet   Iris Round and reactive Round and reactive   Lens  Centered posterior chamber intraocular lens   Anterior Vitreous Normal Normal         Fundus Exam       Right Left   Posterior Vitreous  Posterior vitreous detachment   Disc  Peripapillary atrophy,    C/D Ratio  0.55   Macula  Subretinal neovascular membrane pigmented, Subretinal fibrosis, no hemorrhage, Hard drusen, Retinal pigment epithelial atrophy - geographic, myopic type atrophy, no macular thickening   Vessels  Normal   Periphery  Normal            IMAGING AND PROCEDURES  Imaging and Procedures for 03/23/21  OCT, Retina - OU - Both Eyes       Right Eye Quality was borderline. Scan locations included subfoveal. Central Foveal Thickness: 272. Progression has been stable. Findings include central retinal atrophy, outer retinal atrophy, subretinal scarring.   Left Eye Quality was borderline. **Note De-Identified [place] Obfuscation** Scan locations included subfoveal. Central Foveal Thickness: 411.  Progression has been stable. Findings include choroidal neovascular membrane, epiretinal membrane, intraretinal fluid, cystoid macular edema.   Notes OD with central scarring from dry ARMD, myopic macular degeneration, increase atrophy over the last 3 years from prior OCT evaluation  OS with slightly more active CNVM inferonasal and a second lesion superior to the fovea, incidental epiretinal membrane noted left eye, nonfoveal distorting, current currently at 7-week follow-up.  We will repeat injection today examination next in 7 weeks      Intravitreal Injection, Pharmacologic Agent - OS - Left Eye       Time Out 03/23/2021. 2:52 PM. Confirmed correct patient, procedure, site, and patient consented.   Anesthesia Topical anesthesia was used. Anesthetic medications included Lidocaine 4%.   Procedure Preparation included Tobramycin 0.3%, 10% betadine to eyelids, 5% betadine to ocular surface. A 30 gauge needle was used.   Injection: 2.5 mg bevacizumab 2.5 MG/0.1ML   Route: Intravitreal, Site: Left Eye   NDC: [phone], Lot: [id number]   Post-op Post injection exam found visual acuity of at least counting fingers. The patient tolerated the procedure well. There were no complications. The patient received written and verbal post procedure care education. Post injection medications were not given.              ASSESSMENT/PLAN:  Advanced nonexudative age-related macular degeneration of right eye with subfoveal involvement Geographic atrophy centrally accounts for acuity no sign of CNVM  Exudative age-related macular degeneration of left eye with active choroidal neovascularization (HCC) Today at 7-week interval, no or signs of recurrent subretinal fluid.  Repeat injection today Avastin and maintain 7-week follow-up     ICD-10-CM   1. Exudative age-related macular degeneration of left eye with active choroidal neovascularization (HCC)  H35.3221 OCT, Retina - OU - Both Eyes     Intravitreal Injection, Pharmacologic Agent - OS - Left Eye    bevacizumab (AVASTIN) SOSY 2.5 mg    2. Advanced nonexudative age-related macular degeneration of right eye with subfoveal involvement  H35.3114       1.  OS, vastly improved macular findings at 7-week interval post Avastin.  Previous recurrences at 12-week interval.  We will repeat injection today and examination next in 7-week  2.  3.  Ophthalmic Meds Ordered this visit:  Meds ordered this encounter  Medications   bevacizumab (AVASTIN) SOSY 2.5 mg       Return in about 7 weeks (around 05/11/2021) for dilate, OS, AVASTIN OCT.  There are no Patient Instructions on file for this visit.   Explained the diagnoses, plan, and follow up with the patient and they expressed understanding.  Patient expressed understanding of the importance of proper follow up care.   Clent Demark Tylan Kinn M.D. Diseases & Surgery of the Retina and Vitreous Retina & Diabetic [place] 03/23/21     Abbreviations: M myopia (nearsighted); A astigmatism; H hyperopia (farsighted); P presbyopia; Mrx spectacle prescription;  CTL contact lenses; OD right eye; OS left eye; OU both eyes  XT exotropia; ET esotropia; PEK punctate epithelial keratitis; PEE punctate epithelial erosions; DES dry eye syndrome; MGD meibomian gland dysfunction; ATs artificial tears; PFAT's preservative free artificial tears; [place] nuclear sclerotic cataract; PSC posterior subcapsular cataract; ERM epi-retinal membrane; PVD posterior vitreous detachment; RD retinal detachment; DM diabetes mellitus; DR diabetic retinopathy; NPDR non-proliferative diabetic retinopathy; PDR proliferative diabetic retinopathy; CSME clinically significant macular edema; DME diabetic macular edema; dbh dot blot hemorrhages; CWS cotton wool spot; POAG primary **Note De-Identified [place] Obfuscation** open angle glaucoma; C/D cup-to-disc ratio; HVF humphrey visual field; GVF goldmann visual field; OCT optical coherence tomography; IOP intraocular  pressure; BRVO Branch retinal vein occlusion; CRVO central retinal vein occlusion; CRAO central retinal artery occlusion; BRAO branch retinal artery occlusion; RT retinal tear; SB scleral buckle; PPV pars plana vitrectomy; VH Vitreous hemorrhage; PRP panretinal laser photocoagulation; IVK intravitreal kenalog; VMT vitreomacular traction; MH Macular hole;  NVD neovascularization of the disc; NVE neovascularization elsewhere; AREDS age related eye disease study; ARMD age related macular degeneration; POAG primary open angle glaucoma; EBMD epithelial/anterior basement membrane dystrophy; ACIOL anterior chamber intraocular lens; IOL intraocular lens; PCIOL posterior chamber intraocular lens; Phaco/IOL phacoemulsification with intraocular lens placement; [place] photorefractive keratectomy; LASIK laser assisted in situ keratomileusis; HTN hypertension; DM diabetes mellitus; COPD chronic obstructive pulmonary disease

## 2021-03-23 NOTE — Assessment & Plan Note (Signed)
**Note De-Identified [place] Obfuscation** Geographic atrophy centrally accounts for acuity no sign of CNVM

## 2021-03-23 NOTE — Assessment & Plan Note (Signed)
**Note De-Identified [place] Obfuscation** Today at 7-week interval, no or signs of recurrent subretinal fluid.  Repeat injection today Avastin and maintain 7-week follow-up

## 2021-04-19 DIAGNOSIS — N401 Enlarged prostate with lower urinary tract symptoms: Secondary | ICD-10-CM | POA: Diagnosis not present

## 2021-04-19 DIAGNOSIS — R3915 Urgency of urination: Secondary | ICD-10-CM | POA: Diagnosis not present

## 2021-04-19 DIAGNOSIS — N4341 Spermatocele of epididymis, single: Secondary | ICD-10-CM | POA: Diagnosis not present

## 2021-04-19 DIAGNOSIS — R3914 Feeling of incomplete bladder emptying: Secondary | ICD-10-CM | POA: Diagnosis not present

## 2021-04-30 ENCOUNTER — Encounter: Payer: Self-pay | Admitting: Family Medicine

## 2021-04-30 ENCOUNTER — Ambulatory Visit (INDEPENDENT_AMBULATORY_CARE_PROVIDER_SITE_OTHER): Payer: PPO | Admitting: Family Medicine

## 2021-04-30 VITALS — BP 114/59 | HR 61 | Ht 71.0 in | Wt 238.0 lb

## 2021-04-30 DIAGNOSIS — N400 Enlarged prostate without lower urinary tract symptoms: Secondary | ICD-10-CM

## 2021-04-30 DIAGNOSIS — I10 Essential (primary) hypertension: Secondary | ICD-10-CM

## 2021-04-30 DIAGNOSIS — Z23 Encounter for immunization: Secondary | ICD-10-CM

## 2021-04-30 DIAGNOSIS — E782 Mixed hyperlipidemia: Secondary | ICD-10-CM

## 2021-04-30 DIAGNOSIS — E781 Pure hyperglyceridemia: Secondary | ICD-10-CM

## 2021-04-30 DIAGNOSIS — Z0001 Encounter for general adult medical examination with abnormal findings: Secondary | ICD-10-CM | POA: Diagnosis not present

## 2021-04-30 DIAGNOSIS — Z789 Other specified health status: Secondary | ICD-10-CM

## 2021-04-30 DIAGNOSIS — Z0000 Encounter for general adult medical examination without abnormal findings: Secondary | ICD-10-CM

## 2021-04-30 DIAGNOSIS — M1A9XX0 Chronic gout, unspecified, without tophus (tophi): Secondary | ICD-10-CM

## 2021-04-30 LAB — LIPID PANEL

## 2021-04-30 MED ORDER — CITALOPRAM HYDROBROMIDE 40 MG PO TABS: 40.0000 mg | ORAL_TABLET | Freq: Every day | ORAL | 3 refills | Status: DC

## 2021-04-30 MED ORDER — ROSUVASTATIN CALCIUM 20 MG PO TABS: 20.0000 mg | ORAL_TABLET | Freq: Every day | ORAL | 3 refills | Status: DC

## 2021-04-30 MED ORDER — CYCLOBENZAPRINE HCL 10 MG PO TABS: 10.0000 mg | ORAL_TABLET | Freq: Three times a day (TID) | ORAL | 2 refills | Status: DC | PRN

## 2021-04-30 MED ORDER — ALLOPURINOL 100 MG PO TABS: 100.0000 mg | ORAL_TABLET | Freq: Two times a day (BID) | ORAL | 3 refills | Status: DC

## 2021-04-30 MED ORDER — LOSARTAN POTASSIUM 25 MG PO TABS: 25.0000 mg | ORAL_TABLET | Freq: Every day | ORAL | 3 refills | Status: DC

## 2021-04-30 NOTE — Progress Notes (Signed)
**Note De-Identified Negro Obfuscation** BP (!) 114/59    Pulse 61    Ht 5' 11"  (1.803 m)    Wt 238 lb (108 kg)    SpO2 98%    BMI 33.19 kg/m    Subjective:   Patient ID: Daniel Brock, male    DOB: 11-May-1945, 76 y.o.   MRN: [id number]  HPI: Daniel Brock is a 76 y.o. male presenting on 04/30/2021 for Medical Management of Chronic Issues (CPE)   HPI Physical exam Patient is coming today for physical exam and recheck of chronic medical issues.  He says he gets occasional chest pains but not necessarily with exertion or with shortness of breath but he has a family history of heart disease and wants to go see cardiology to get it checked out. Patient denies any chest pain, shortness of breath, headaches, abdominal complaints, diarrhea, nausea, vomiting, or joint issues.  He has a lot of vision issues because of macular degeneration and sees ophthalmology for this.  Hyperlipidemia and hypertriglyceridemia Patient is coming in for recheck of his hyperlipidemia. The patient is currently taking Zetia and Crestor. They deny any issues with myalgias or history of liver damage from it. They deny any focal numbness or weakness or chest pain.   Prediabetes Patient comes in today for recheck of his diabetes. Patient has been currently taking no medication currently with diet control. Patient is currently on an ACE inhibitor/ARB. Patient has not seen an ophthalmologist this year. Patient denies any issues with their feet. The symptom started onset as an adult hyperlipidemia and prediabetes and hypertension ARE RELATED TO DM   BPH Patient is coming in for recheck on BPH Symptoms: He does have some difficulty starting his stream Medication: He says he was started on a new medication and taken off the Flomax by urology just a couple weeks ago but he cannot recall what the medicine is and he will call us with the name of it. Last PSA: 2020  Relevant past medical, surgical, family and social history reviewed and updated as indicated. Interim  medical history since our last visit reviewed. Allergies and medications reviewed and updated.  Review of Systems  Constitutional:  Negative for chills and fever.  Eyes:  Positive for visual disturbance. Negative for discharge.  Respiratory:  Negative for shortness of breath and wheezing.   Cardiovascular:  Negative for chest pain and leg swelling.  Musculoskeletal:  Negative for back pain and gait problem.  Skin:  Negative for rash.  Neurological:  Negative for dizziness, weakness and light-headedness.  All other systems reviewed and are negative.  Per HPI unless specifically indicated above   Allergies as of 04/30/2021       Reactions   Ciprofloxacin Nausea And Vomiting   Ciprofloxacin Hcl    REACTION: NAUSIA AND VOMITING   Sulfamethoxazole-trimethoprim    REACTION: nausea and vomiting   Sulfonamide Derivatives    REACTION: itching   Sulphur [elemental Sulfur] Rash        Medication List        Accurate as of April 30, 2021 10:39 AM. If you have any questions, ask your nurse or doctor.          STOP taking these medications    allopurinol 100 MG tablet Commonly known as: ZYLOPRIM Stopped by: Worthy Rancher, MD   cyclobenzaprine 10 MG tablet Commonly known as: FLEXERIL Stopped by: Worthy Rancher, MD   HYDROcodone-ibuprofen 7.5-200 MG tablet Commonly known as: VICOPROFEN Stopped by: Worthy Rancher, MD **Note De-Identified [place]ester Obfuscation** tamsulosin 0.4 MG Caps capsule Commonly known as: FLOMAX Stopped by: [name], MD       TAKE these medications    citalopram 40 MG tablet Commonly known as: CELEXA Take 1 tablet (40 mg total) by mouth daily.   colchicine 0.6 MG tablet Take 1 tablet (0.6 mg total) by mouth daily as needed.   ezetimibe 10 MG tablet Commonly known as: Zetia Take 1 tablet (10 mg total) by mouth daily.   losartan 25 MG tablet Commonly known as: COZAAR Take 1 tablet (25 mg total) by mouth daily.   OVER THE COUNTER MEDICATION Eye  vitamin   rosuvastatin 20 MG tablet Commonly known as: CRESTOR Take 1 tablet (20 mg total) by mouth daily.         Objective:   BP (!) 114/59    Pulse 61    Ht 5' 11"  (1.803 m)    Wt 238 lb (108 kg)    SpO2 98%    BMI 33.19 kg/m   Wt Readings from Last 3 Encounters:  04/30/21 238 lb (108 kg)  12/09/20 245 lb (111.1 kg)  10/30/20 245 lb (111.1 kg)    Physical Exam Vitals and nursing note reviewed.  Constitutional:      General: He is not in acute distress.    Appearance: He is well-developed. He is not diaphoretic.  Eyes:     General: No scleral icterus.    Conjunctiva/sclera: Conjunctivae normal.  Neck:     Thyroid: No thyromegaly.  Cardiovascular:     Rate and Rhythm: Normal rate and regular rhythm.     Heart sounds: Normal heart sounds. No murmur heard. Pulmonary:     Effort: Pulmonary effort is normal. No respiratory distress.     Breath sounds: Normal breath sounds. No wheezing.  Musculoskeletal:        General: No swelling. Normal range of motion.     Cervical back: Neck supple.  Lymphadenopathy:     Cervical: No cervical adenopathy.  Skin:    General: Skin is warm and dry.     Findings: No rash.  Neurological:     Mental Status: He is alert and oriented to person, place, and time.     Coordination: Coordination normal.  Psychiatric:        Behavior: Behavior normal.      Assessment & Plan:   Problem List Items Addressed This Visit       Cardiovascular and Mediastinum   Essential hypertension   Relevant Medications   rosuvastatin (CRESTOR) 20 MG tablet   losartan (COZAAR) 25 MG tablet   Other Relevant Orders   CBC with Differential/Platelet     Genitourinary   BPH (benign prostatic hyperplasia)   Relevant Orders   PSA, total and free     Other   HLD (hyperlipidemia)   Relevant Medications   rosuvastatin (CRESTOR) 20 MG tablet   losartan (COZAAR) 25 MG tablet   Other Relevant Orders   CBC with Differential/Platelet   CMP14+EGFR    Lipid panel   Gout   Hypertriglyceridemia   Relevant Medications   rosuvastatin (CRESTOR) 20 MG tablet   losartan (COZAAR) 25 MG tablet   Other Relevant Orders   CMP14+EGFR   Lipid panel   Other Visit Diagnoses     Need for immunization against influenza    -  Primary   Relevant Orders   Flu Vaccine QUAD High Dose(Fluad) (Completed)   Well adult exam **Note De-Identified Hallett Obfuscation** Relevant Orders   CBC with Differential/Platelet   CMP14+EGFR   Lipid panel   Dieting       Relevant Orders   VITAMIN D 25 Hydroxy (Vit-D Deficiency, Fractures)   Vitamin C       Continue current medicine, he will call us and what ever medicines are given for BPH.  They recommended for him to stop his allopurinol so he will stop that.  Follow up plan: Return in about 6 months (around 10/28/2021), or if symptoms worsen or fail to improve, for Cholesterol recheck.  Counseling provided for all of the vaccine components Orders Placed This Encounter  Procedures   Flu Vaccine QUAD High Dose(Fluad)   CBC with Differential/Platelet   CMP14+EGFR   Lipid panel   PSA, total and free   VITAMIN D 25 Hydroxy (Vit-D Deficiency, Fractures)   Vitamin C    Caryl Pina, MD [facility] 04/30/2021, 10:39 AM

## 2021-05-04 LAB — CBC WITH DIFFERENTIAL/PLATELET
Basophils Absolute: 0 10*3/uL (ref 0.0–0.2)
Basos: 1 %
EOS (ABSOLUTE): 0.1 10*3/uL (ref 0.0–0.4)
Eos: 3 %
Hematocrit: 44.5 % (ref 37.5–51.0)
Hemoglobin: 14.5 g/dL (ref 13.0–17.7)
Immature Grans (Abs): 0 10*3/uL (ref 0.0–0.1)
Immature Granulocytes: 0 %
Lymphocytes Absolute: 1.2 10*3/uL (ref 0.7–3.1)
Lymphs: 27 %
MCH: 28.3 pg (ref 26.6–33.0)
MCHC: 32.6 g/dL (ref 31.5–35.7)
MCV: 87 fL (ref 79–97)
Monocytes Absolute: 0.4 10*3/uL (ref 0.1–0.9)
Monocytes: 8 %
Neutrophils Absolute: 2.9 10*3/uL (ref 1.4–7.0)
Neutrophils: 61 %
Platelets: 172 10*3/uL (ref 150–450)
RBC: 5.13 x10E6/uL (ref 4.14–5.80)
RDW: 13.6 % (ref 11.6–15.4)
WBC: 4.6 10*3/uL (ref 3.4–10.8)

## 2021-05-04 LAB — CMP14+EGFR
ALT: 13 IU/L (ref 0–44)
AST: 13 IU/L (ref 0–40)
Albumin/Globulin Ratio: 1.9 (ref 1.2–2.2)
Albumin: 4.1 g/dL (ref 3.7–4.7)
Alkaline Phosphatase: 96 IU/L (ref 44–121)
BUN/Creatinine Ratio: 15 (ref 10–24)
BUN: 18 mg/dL (ref 8–27)
Bilirubin Total: 0.5 mg/dL (ref 0.0–1.2)
CO2: 25 mmol/L (ref 20–29)
Calcium: 9.2 mg/dL (ref 8.6–10.2)
Chloride: 104 mmol/L (ref 96–106)
Creatinine, Ser: 1.22 mg/dL (ref 0.76–1.27)
Globulin, Total: 2.2 g/dL (ref 1.5–4.5)
Glucose: 112 mg/dL — ABNORMAL HIGH (ref 70–99)
Potassium: 4.3 mmol/L (ref 3.5–5.2)
Sodium: 142 mmol/L (ref 134–144)
Total Protein: 6.3 g/dL (ref 6.0–8.5)
eGFR: 61 mL/min/{1.73_m2} (ref >59)

## 2021-05-04 LAB — PSA, TOTAL AND FREE
PSA, Free Pct: 37.1 %
PSA, Free: 1.04 ng/mL
Prostate Specific Ag, Serum: 2.8 ng/mL (ref 0.0–4.0)

## 2021-05-04 LAB — VITAMIN C: Vitamin C: 0.6 mg/dL (ref 0.4–2.0)

## 2021-05-04 LAB — LIPID PANEL
Chol/HDL Ratio: 3.5 ratio (ref 0.0–5.0)
Cholesterol, Total: 130 mg/dL (ref 100–199)
HDL: 37 mg/dL — ABNORMAL LOW (ref >39)
LDL Chol Calc (NIH): 61 mg/dL (ref 0–99)
Triglycerides: 196 mg/dL — ABNORMAL HIGH (ref 0–149)
VLDL Cholesterol Cal: 32 mg/dL (ref 5–40)

## 2021-05-04 LAB — VITAMIN D 25 HYDROXY (VIT D DEFICIENCY, FRACTURES): Vit D, 25-Hydroxy: 18.2 ng/mL — ABNORMAL LOW (ref 30.0–100.0)

## 2021-05-11 ENCOUNTER — Ambulatory Visit (INDEPENDENT_AMBULATORY_CARE_PROVIDER_SITE_OTHER): Payer: PPO | Admitting: Ophthalmology

## 2021-05-11 ENCOUNTER — Other Ambulatory Visit: Payer: Self-pay

## 2021-05-11 ENCOUNTER — Encounter (INDEPENDENT_AMBULATORY_CARE_PROVIDER_SITE_OTHER): Payer: Self-pay | Admitting: Ophthalmology

## 2021-05-11 DIAGNOSIS — H353221 Exudative age-related macular degeneration, left eye, with active choroidal neovascularization: Secondary | ICD-10-CM | POA: Diagnosis not present

## 2021-05-11 DIAGNOSIS — H3530 Unspecified macular degeneration: Secondary | ICD-10-CM | POA: Diagnosis not present

## 2021-05-11 MED ORDER — BEVACIZUMAB 2.5 MG/0.1ML IZ SOSY
2.5000 mg | PREFILLED_SYRINGE | INTRAVITREAL | Status: AC | PRN
  Administered 2021-05-11: 2.5 mg via INTRAVITREAL

## 2021-05-11 NOTE — Progress Notes (Signed)
**Note De-Identified Petronio Obfuscation** 05/11/2021     CHIEF COMPLAINT Patient presents for  Chief Complaint  Patient presents with   Macular Degeneration      HISTORY OF PRESENT ILLNESS: Daniel Brock is a 76 y.o. male who presents to the clinic today for:   HPI   7 weeks dilate OS, Avastin OCT.,  With history of combined age-related and myopic macular degeneration.  Extensive geographic atrophy in the right eye stable for many decades.  OS with recurrent CME in the past, currently at 7-week follow-up Pt has ciprofloxacin allergy. Patient states vision is stable and unchanged since last visit. Denies any new floaters or FOL.   Last edited by Hurman Horn, MD on 05/11/2021  2:48 PM.      Referring physician: Dettinger, Fransisca Kaufmann, MD [place],  [place] [id number]  HISTORICAL INFORMATION:   Selected notes from the MEDICAL RECORD NUMBER    Lab Results  Component Value Date   HGBA1C 5.6 10/30/2020     CURRENT MEDICATIONS: No current outpatient medications on file. (Ophthalmic Drugs)   No current facility-administered medications for this visit. (Ophthalmic Drugs)   Current Outpatient Medications (Other)  Medication Sig   citalopram (CELEXA) 40 MG tablet Take 1 tablet (40 mg total) by mouth daily.   colchicine 0.6 MG tablet Take 1 tablet (0.6 mg total) by mouth daily as needed.   ezetimibe (ZETIA) 10 MG tablet Take 1 tablet (10 mg total) by mouth daily.   losartan (COZAAR) 25 MG tablet Take 1 tablet (25 mg total) by mouth daily.   OVER THE COUNTER MEDICATION Eye vitamin   rosuvastatin (CRESTOR) 20 MG tablet Take 1 tablet (20 mg total) by mouth daily.   No current facility-administered medications for this visit. (Other)      REVIEW OF SYSTEMS: ROS   Negative for: Constitutional, Gastrointestinal, Neurological, Skin, Genitourinary, Musculoskeletal, HENT, Endocrine, Cardiovascular, Eyes, Respiratory, Psychiatric, Allergic/Imm, Heme/Lymph Last edited by Hurman Horn, MD on 05/11/2021  2:45  PM.       ALLERGIES Allergies  Allergen Reactions   Ciprofloxacin Nausea And Vomiting   Ciprofloxacin Hcl     REACTION: NAUSIA AND VOMITING   Sulfamethoxazole-Trimethoprim     REACTION: nausea and vomiting   Sulfonamide Derivatives     REACTION: itching   Sulphur [Elemental Sulfur] Rash    PAST MEDICAL HISTORY Past Medical History:  Diagnosis Date   Allergy    Anxiety    Cataract    Chronic foot pain    Chronic kidney disease    Chronic leg pain    Depression 2016   Gout    Hiatal hernia    Hyperlipidemia    Hypertension    Renal insufficiency    Varicose veins    Past Surgical History:  Procedure Laterality Date   CARPAL TUNNEL RELEASE Left    CARPAL TUNNEL RELEASE Bilateral    CHOLECYSTECTOMY  2026   COLONOSCOPY     COLONOSCOPY W/ POLYPECTOMY     EYE SURGERY Bilateral 1958, 2004   cataracts   INGUINAL HERNIA REPAIR     JOINT REPLACEMENT  2002   TOTAL KNEE ARTHROPLASTY Right 2002   ulnar intrapment release  07/2008   VARICOSE VEIN SURGERY Left 1980's per patient   Dr. Maurene Capes    FAMILY HISTORY Family History  Problem Relation Age of Onset   Hypertension Mother    Hyperlipidemia Mother    Arthritis Mother    Heart disease Mother    Huntington's **Note De-Identified [place]ay Obfuscation** disease Mother    Cancer Father        pancreatic cancer   Arthritis Father    Diabetes Father    Hypertension Father    Hyperlipidemia Sister    Hypertension Sister    Hypertension Brother    Vision loss Brother    Huntington's disease Brother    Heart disease Maternal Grandmother    Early death Maternal Grandfather    Colon cancer Neg Hx    Esophageal cancer Neg Hx    Rectal cancer Neg Hx    Stomach cancer Neg Hx     SOCIAL HISTORY Social History   Tobacco Use   Smoking status: Never   Smokeless tobacco: Never  Vaping Use   Vaping Use: Never used  Substance Use Topics   Alcohol use: No   Drug use: No         OPHTHALMIC EXAM:  Base Eye Exam     Visual Acuity (ETDRS)       Right  Left   Dist [place] 20/125 -1 20/80   Dist cc 20/200 -1 20/50 -1   Dist ph [place] NI 20/60 -1         Tonometry (Tonopen, 2:10 PM)       Right Left   Pressure 16 12         Pupils       Pupils Dark Light APD   Right PERRL 4 3 None   Left PERRL 4 3 None         Visual Fields       Left Right    Full Full         Neuro/Psych     Oriented x3: Yes   Mood/Affect: Normal         Dilation     Left eye: 1.0% Mydriacyl, 2.5% Phenylephrine @ 2:10 PM           Slit Lamp and Fundus Exam     External Exam       Right Left   External Normal Normal         Slit Lamp Exam       Right Left   Lids/Lashes Normal Normal   Conjunctiva/Sclera White and quiet White and quiet   Cornea Clear Clear   Anterior Chamber Deep and quiet Deep and quiet   Iris Round and reactive Round and reactive   Lens  Centered posterior chamber intraocular lens   Anterior Vitreous Normal Normal         Fundus Exam       Right Left   Posterior Vitreous  Posterior vitreous detachment   Disc  Peripapillary atrophy,    C/D Ratio  0.55   Macula  Subretinal neovascular membrane pigmented, Subretinal fibrosis, no hemorrhage, Hard drusen, Retinal pigment epithelial atrophy - geographic, myopic type atrophy, no macular thickening   Vessels  Normal   Periphery  Normal            IMAGING AND PROCEDURES  Imaging and Procedures for 05/11/21  Intravitreal Injection, Pharmacologic Agent - OS - Left Eye       Time Out 05/11/2021. 2:48 PM. Confirmed correct patient, procedure, site, and patient consented.   Anesthesia Topical anesthesia was used. Anesthetic medications included Lidocaine 4%.   Procedure Preparation included Tobramycin 0.3%, 10% betadine to eyelids, 5% betadine to ocular surface. A 30 gauge needle was used.   Injection: 2.5 mg bevacizumab 2.5 MG/0.1ML   Route: Intravitreal, Site: Left Eye **Note De-Identified Brashier Obfuscation** NDC: 22979-892-11, Lot: [id number]   Post-op Post injection exam found  visual acuity of at least counting fingers. The patient tolerated the procedure well. There were no complications. The patient received written and verbal post procedure care education. Post injection medications were not given.      OCT, Retina - OU - Both Eyes       Right Eye Quality was borderline. Scan locations included subfoveal. Central Foveal Thickness: 272. Progression has been stable. Findings include central retinal atrophy, outer retinal atrophy, subretinal scarring.   Left Eye Quality was borderline. Scan locations included subfoveal. Progression has been stable. Findings include choroidal neovascular membrane, epiretinal membrane, intraretinal fluid, cystoid macular edema.   Notes OD with central scarring from dry ARMD, myopic macular degeneration, increase atrophy over the last 3 years from prior OCT evaluation  OS with slightly more active CNVM inferonasal and a second lesion superior to the fovea, incidental epiretinal membrane noted left eye, nonfoveal distorting, current currently at 7-week follow-up.  We will repeat injection today examination next in 7 weeks              ASSESSMENT/PLAN:  Exudative age-related macular degeneration of left eye with active choroidal neovascularization (HCC) OS, improved overall yet still active at 7-week interval.  We will repeat injection today and maintain 7-week interval follow-up and injection Avastin to maintain 20/50 best corrected acuity  Myopic macular degeneration of both eyes Geographic atrophy OD     ICD-10-CM   1. Exudative age-related macular degeneration of left eye with active choroidal neovascularization (HCC)  H35.3221 Intravitreal Injection, Pharmacologic Agent - OS - Left Eye    OCT, Retina - OU - Both Eyes    bevacizumab (AVASTIN) SOSY 2.5 mg    2. Myopic macular degeneration of both eyes  H35.30       1.  OS stable acuity and still slightly active intraretinal fluid from CNVM at 7-week interval post  Avastin.  We will repeat injection today to maintain  2.  3.  Ophthalmic Meds Ordered this visit:  Meds ordered this encounter  Medications   bevacizumab (AVASTIN) SOSY 2.5 mg       Return in about 7 weeks (around 06/29/2021) for DILATE OU, AVASTIN OCT, OS.  There are no Patient Instructions on file for this visit.   Explained the diagnoses, plan, and follow up with the patient and they expressed understanding.  Patient expressed understanding of the importance of proper follow up care.   Clent Demark Jancie Kercher M.D. Diseases & Surgery of the Retina and Vitreous Retina [facility] Diabetic [place] 05/11/21     Abbreviations: M myopia (nearsighted); A astigmatism; H hyperopia (farsighted); P presbyopia; Mrx spectacle prescription;  CTL contact lenses; OD right eye; OS left eye; OU both eyes  XT exotropia; ET esotropia; PEK punctate epithelial keratitis; PEE punctate epithelial erosions; DES dry eye syndrome; MGD meibomian gland dysfunction; ATs artificial tears; PFAT's preservative free artificial tears; [place] nuclear sclerotic cataract; PSC posterior subcapsular cataract; ERM epi-retinal membrane; PVD posterior vitreous detachment; RD retinal detachment; DM diabetes mellitus; DR diabetic retinopathy; NPDR non-proliferative diabetic retinopathy; PDR proliferative diabetic retinopathy; CSME clinically significant macular edema; DME diabetic macular edema; dbh dot blot hemorrhages; CWS cotton wool spot; POAG primary open angle glaucoma; C/D cup-to-disc ratio; HVF humphrey visual field; GVF goldmann visual field; OCT optical coherence tomography; IOP intraocular pressure; BRVO Branch retinal vein occlusion; CRVO central retinal vein occlusion; CRAO central retinal artery occlusion; BRAO branch retinal artery occlusion; RT retinal tear; SB scleral **Note De-Identified [place] Obfuscation** buckle; PPV pars plana vitrectomy; VH Vitreous hemorrhage; PRP panretinal laser photocoagulation; IVK intravitreal kenalog; VMT vitreomacular traction; MH Macular  hole;  NVD neovascularization of the disc; NVE neovascularization elsewhere; AREDS age related eye disease study; ARMD age related macular degeneration; POAG primary open angle glaucoma; EBMD epithelial/anterior basement membrane dystrophy; ACIOL anterior chamber intraocular lens; IOL intraocular lens; PCIOL posterior chamber intraocular lens; Phaco/IOL phacoemulsification with intraocular lens placement; [place] photorefractive keratectomy; LASIK laser assisted in situ keratomileusis; HTN hypertension; DM diabetes mellitus; COPD chronic obstructive pulmonary disease

## 2021-05-11 NOTE — Assessment & Plan Note (Signed)
**Note De-Identified Cast Obfuscation** Geographic atrophy OD

## 2021-05-11 NOTE — Assessment & Plan Note (Signed)
**Note De-Identified Richer Obfuscation** OS, improved overall yet still active at 7-week interval.  We will repeat injection today and maintain 7-week interval follow-up and injection Avastin to maintain 20/50 best corrected acuity

## 2021-05-12 ENCOUNTER — Other Ambulatory Visit: Payer: Self-pay

## 2021-05-12 DIAGNOSIS — N1832 Chronic kidney disease, stage 3b: Secondary | ICD-10-CM

## 2021-05-12 DIAGNOSIS — E782 Mixed hyperlipidemia: Secondary | ICD-10-CM

## 2021-05-12 DIAGNOSIS — Z0000 Encounter for general adult medical examination without abnormal findings: Secondary | ICD-10-CM

## 2021-05-12 DIAGNOSIS — E781 Pure hyperglyceridemia: Secondary | ICD-10-CM

## 2021-06-07 ENCOUNTER — Encounter: Payer: Self-pay | Admitting: Cardiology

## 2021-06-07 DIAGNOSIS — R0602 Shortness of breath: Secondary | ICD-10-CM | POA: Insufficient documentation

## 2021-06-07 NOTE — Progress Notes (Signed)
**Note De-Identified Marcin Obfuscation** Cardiology Office Note   Date:  06/09/2021   ID:  Daniel Brock, DOB Oct 17, 1945, MRN [id number]  PCP:  Dettinger, Elige Radon, MD  Cardiologist:   Prentice Docker, MD (Inactive) Referring:  Dettinger, Elige Radon, MD  Chief Complaint  Patient presents with   Shortness of Breath      History of Present Illness: Daniel Brock is a 76 y.o. male who presents for evaluation of shortness of breath.  He saw Dr. Purvis Sheffield last in 2020 for evaluation of SOB.   Perfusion study demonstrated a large defect of moderate severity in the mid inferoseptal, mid inferior inferolateral and apical, apical inferior and apical lateral locations..  He had an echo with NL LV function.   He was managed medically.  He has multiple complaints.  The first is shortness of breath.  He says this happens with activity such as walking 50 yards on level ground.  He does not notice it at rest and he is not describing PND or orthopnea.  He does not sound like he is overly active but he is not describing chest pressure.  However, he does have nausea.  The nausea seems to happen after foods.  He does have a hiatal hernia and wonders if it could be associated with this.  He is not describing associated diaphoresis, chest discomfort, neck or arm discomfort with his shortness of breath.  However, shortness of breath has gotten worse.  He has dizziness.  This seems to happen sometimes when he stands up but seems mostly to be episodic and he has been told that he might have vertigo.  He has not had any palpitations, presyncope or syncope.  He does have bendopathy.  He has a strong family history for early coronary artery disease.  He has multiple cardiovascular risk factors.  He has had an abnormal EKG with interventricular conduction delay and left axis deviation.  Past Medical History:  Diagnosis Date   Allergy    Anxiety    Cataract    Chronic kidney disease    Depression 2016   Gout    Hiatal hernia    Hyperlipidemia     Hypertension    Renal insufficiency    Varicose veins     Past Surgical History:  Procedure Laterality Date   CARPAL TUNNEL RELEASE Left    CARPAL TUNNEL RELEASE Bilateral    CHOLECYSTECTOMY  2026   COLONOSCOPY     COLONOSCOPY W/ POLYPECTOMY     EYE SURGERY Bilateral 1958, 2004   cataracts   INGUINAL HERNIA REPAIR     JOINT REPLACEMENT  2002   TOTAL KNEE ARTHROPLASTY Right 2002   ulnar intrapment release  07/2008   VARICOSE VEIN SURGERY Left 1980's per patient     Current Outpatient Medications  Medication Sig Dispense Refill   citalopram (CELEXA) 40 MG tablet Take 1 tablet (40 mg total) by mouth daily. 90 tablet 3   colchicine 0.6 MG tablet Take 1 tablet (0.6 mg total) by mouth daily as needed. 20 tablet 2   cyclobenzaprine (FLEXERIL) 10 MG tablet Take 10 mg by mouth daily as needed.     ezetimibe (ZETIA) 10 MG tablet Take 1 tablet (10 mg total) by mouth daily. 90 tablet 3   losartan (COZAAR) 25 MG tablet Take 1 tablet (25 mg total) by mouth daily. 90 tablet 3   metoprolol tartrate (LOPRESSOR) 50 MG tablet Take 1 tablet (50 mg total) by mouth as directed. Take 1 tablet 2 hours **Note De-Identified Valenza Obfuscation** before your CT scan 1 tablet 0   OVER THE COUNTER MEDICATION Eye vitamin     rosuvastatin (CRESTOR) 20 MG tablet Take 1 tablet (20 mg total) by mouth daily. 90 tablet 3   tamsulosin (FLOMAX) 0.4 MG CAPS capsule Take 0.4 mg by mouth. Take two tablets by mouth daily     No current facility-administered medications for this visit.    Allergies:   Ciprofloxacin, Ciprofloxacin hcl, Sulfamethoxazole-trimethoprim, Sulfonamide derivatives, and Sulphur [elemental sulfur]    Social History:  The patient  reports that he has never smoked. He has never used smokeless tobacco. He reports that he does not drink alcohol and does not use drugs.   Family History:  The patient's family history includes Arthritis in his father and mother; Cancer in his father; Diabetes in his father; Early death in his maternal  grandfather; Heart disease in his maternal grandmother; Heart disease (age of onset: 22) in his mother; Huntington's disease in his brother and mother; Hyperlipidemia in his mother and sister; Hypertension in his brother, father, mother, and sister; Vision loss in his brother.    ROS:  Please see the history of present illness.   Otherwise, review of systems are positive for none.   All other systems are reviewed and negative.    PHYSICAL EXAM: VS:  BP 118/80   Pulse (!) 57   Ht 5\' 11"  (1.803 m)   Wt 245 lb (111.1 kg)   BMI 34.17 kg/m  , BMI Body mass index is 34.17 kg/m. GENERAL:  Well appearing HEENT:  Pupils equal round and reactive, fundi not visualized, oral mucosa unremarkable NECK:  No jugular venous distention, waveform within normal limits, carotid upstroke brisk and symmetric, no bruits, no thyromegaly LYMPHATICS:  No cervical, inguinal adenopathy LUNGS:  Clear to auscultation bilaterally BACK:  No CVA tenderness CHEST:  Unremarkable HEART:  PMI not displaced or sustained,S1 and S2 within normal limits, no S3, no S4, no clicks, no rubs, no murmurs ABD:  Flat, positive bowel sounds normal in frequency in pitch, no bruits, no rebound, no guarding, no midline pulsatile mass, no hepatomegaly, no splenomegaly EXT:  2 plus pulses throughout, no edema, no cyanosis no clubbing SKIN:  No rashes no nodules NEURO:  Cranial nerves II through XII grossly intact, motor grossly intact throughout PSYCH:  Cognitively intact, oriented to person place and time    EKG:  EKG is ordered today. The ekg ordered today demonstrates sinus bradycardia, rate 57, interventricular conduction delay, left axis deviation, no change from previous.   Recent Labs: 04/30/2021: ALT 13; BUN 18; Creatinine, Ser 1.22; Hemoglobin 14.5; Platelets 172; Potassium 4.3; Sodium 142    Lipid Panel    Component Value Date/Time   CHOL 130 04/30/2021 1046   TRIG 196 (H) 04/30/2021 1046   HDL 37 (L) 04/30/2021 1046    CHOLHDL 3.5 04/30/2021 1046   CHOLHDL 6 03/01/2013 1139   VLDL 57.0 (H) 03/01/2013 1139   LDLCALC 61 04/30/2021 1046   LDLDIRECT 108 (H) 06/27/2016 1117   LDLDIRECT 160.9 03/01/2013 1139      Wt Readings from Last 3 Encounters:  06/09/21 245 lb (111.1 kg)  04/30/21 238 lb (108 kg)  12/09/20 245 lb (111.1 kg)      Other studies Reviewed: Additional studies/ records that were reviewed today include: Labs, previous cardiac work-up. Review of the above records demonstrates:  Please see elsewhere in the note.     ASSESSMENT AND PLAN:  SOB: This certainly could be an anginal **Note De-Identified Guard Obfuscation** equivalent.  He has had a previous abnormal perfusion study.  EKG would be uninterpretable for treadmill.  Therefore, he will need a coronary CTA.  I will also check an echocardiogram and a BNP level.  Of note he does have some renal insufficiency with his last creatinine was 1.25.  NAUSEA: He will have the work-up as above.  However, this may be related to his hiatal hernia and be GI and I will defer to his primary provider.  CKD IIIa: He likely will need an i-STAT creatinine at the time of his procedure but I will defer to our nurse navigator.  HTN: His blood pressure is at target.  No change in therapy.  DYSLIPIDEMIA: Goals of therapy will depend on findings on the CT.  DIZZINESS: I will see him back after the above studies and have a low threshold for a monitor although I do not suspect that this is arrhythmic.  He does have conduction disturbance.  He was mildly orthostatic in the office today but this is I think an incidental finding.  BILATERAL CARPAL TUNNEL:   I am going to have a low threshold to consider amyloidosis given the clinical scenario.  Current medicines are reviewed at length with the patient today.  The patient does not have concerns regarding medicines.  The following changes have been made:  no change  Labs/ tests ordered today include:   Orders Placed This Encounter  Procedures   CT  CORONARY MORPH W/CTA COR W/SCORE W/CA W/CM &/OR WO/CM   Basic metabolic panel   EKG 12-Lead   ECHOCARDIOGRAM COMPLETE     Disposition:   FU with me after the CTA and echo   Signed, Rollene Rotunda, MD  06/09/2021 3:46 PM     [facility]

## 2021-06-09 ENCOUNTER — Encounter: Payer: Self-pay | Admitting: Cardiology

## 2021-06-09 ENCOUNTER — Other Ambulatory Visit: Payer: Self-pay

## 2021-06-09 ENCOUNTER — Ambulatory Visit (INDEPENDENT_AMBULATORY_CARE_PROVIDER_SITE_OTHER): Payer: PPO | Admitting: Cardiology

## 2021-06-09 ENCOUNTER — Other Ambulatory Visit: Payer: PPO

## 2021-06-09 ENCOUNTER — Other Ambulatory Visit: Payer: Self-pay | Admitting: *Deleted

## 2021-06-09 VITALS — BP 118/80 | HR 57 | Ht 71.0 in | Wt 245.0 lb

## 2021-06-09 DIAGNOSIS — Z01812 Encounter for preprocedural laboratory examination: Secondary | ICD-10-CM

## 2021-06-09 DIAGNOSIS — E785 Hyperlipidemia, unspecified: Secondary | ICD-10-CM

## 2021-06-09 DIAGNOSIS — I10 Essential (primary) hypertension: Secondary | ICD-10-CM | POA: Diagnosis not present

## 2021-06-09 DIAGNOSIS — R0602 Shortness of breath: Secondary | ICD-10-CM

## 2021-06-09 MED ORDER — METOPROLOL TARTRATE 50 MG PO TABS: 50.0000 mg | ORAL_TABLET | ORAL | 0 refills | Status: DC

## 2021-06-09 NOTE — Patient Instructions (Signed)
**Note De-Identified [place] Obfuscation** Medication Instructions:  ?The current medical regimen is effective;  continue present plan and medications. ? ?*If you need a refill on your cardiac medications before your next appointment, please call your pharmacy* ? ? ?Lab Work: ?Please have blood work today. (BMP) ? ?If you have labs (blood work) drawn today and your tests are completely normal, you will receive your results only by: ?MyChart Message (if you have MyChart) OR ?A paper copy in the mail ?If you have any lab test that is abnormal or we need to change your treatment, we will call you to review the results. ? ? ?Testing/Procedures: ?Your physician has requested that you have an echocardiogram. Echocardiography is a painless test that uses sound waves to create images of your heart. It provides your doctor with information about the size and shape of your heart and how well your heart?s chambers and valves are working. This procedure takes approximately one hour. There are no restrictions for this procedure. ? ? ? ?Your cardiac CT will be scheduled at:  ? ?[facility] ?[address] ?[place], [place] [address] ?[phone] ? ?Please arrive at the [facility] and Children's Entrance (Entrance C2) of [facility] 30 minutes prior to test start time. ?You can use the FREE valet parking offered at entrance C (encouraged to control the heart rate for the test)  ?Proceed to the [facility] Radiology Department (first floor) to check-in and test prep. ? ?All radiology patients and guests should use entrance C2 at [facility], accessed from [facility], even though the hospital's physical address listed is [address]. ? ? ? ? ?Please follow these instructions carefully (unless otherwise directed): ? ?Hold all erectile dysfunction medications at least 3 days (72 hrs) prior to test. ? ?On the Night Before the Test: ?Be sure to Drink plenty of water. ?Do not consume any caffeinated/decaffeinated beverages or  chocolate 12 hours prior to your test. ?Do not take any antihistamines 12 hours prior to your test. ? ?On the Day of the Test: ?Drink plenty of water until 1 hour prior to the test. ?Do not eat any food 4 hours prior to the test. ?You may take your regular medications prior to the test.  ?Take metoprolol (Lopressor) two hours prior to test. ?HOLD Furosemide/Hydrochlorothiazide morning of the test. ?     ?After the Test: ?Drink plenty of water. ?After receiving IV contrast, you may experience a mild flushed feeling. This is normal. ?On occasion, you may experience a mild rash up to 24 hours after the test. This is not dangerous. If this occurs, you can take Benadryl 25 mg and increase your fluid intake. ?If you experience trouble breathing, this can be serious. If it is severe call 911 IMMEDIATELY. If it is mild, please call our office. ?If you take any of these medications: Glipizide/Metformin, Avandament, Glucavance, please do not take 48 hours after completing test unless otherwise instructed. ? ?We will call to schedule your test 2-4 weeks out understanding that some insurance companies will need an authorization prior to the service being performed.  ? ?For non-scheduling related questions, please contact the cardiac imaging nurse navigator should you have any questions/concerns: ?Marchia Bond, Cardiac Imaging Nurse Navigator ?Gordy Clement, Cardiac Imaging Nurse Navigator ?[facility] ?Direct Office Dial: [phone]  ? ?For scheduling needs, including cancellations and rescheduling, please call [place], [phone]. ? ?Follow-Up: ?At [facility], you and your health needs are our priority.  As part of **Note De-Identified Dilling Obfuscation** our continuing mission to provide you with exceptional heart care, we have created designated Provider Care Teams.  These Care Teams include your primary Cardiologist (physician) and Advanced Practice Providers (APPs -  Physician Assistants and Nurse Practitioners) who all work  together to provide you with the care you need, when you need it. ? ?We recommend signing up for the patient portal called "MyChart".  Sign up information is provided on this After Visit Summary.  MyChart is used to connect with patients for Virtual Visits (Telemedicine).  Patients are able to view lab/test results, encounter notes, upcoming appointments, etc.  Non-urgent messages can be sent to your provider as well.   ?To learn more about what you can do with MyChart, go to [email or web address].   ? ?Your next appointment:   ?Follow up to be determined after the above testing. ? ?Thank you for choosing [facility]!! ? ? ? ?

## 2021-06-10 LAB — BASIC METABOLIC PANEL
BUN/Creatinine Ratio: 14 (ref 10–24)
BUN: 17 mg/dL (ref 8–27)
CO2: 26 mmol/L (ref 20–29)
Calcium: 9.2 mg/dL (ref 8.6–10.2)
Chloride: 106 mmol/L (ref 96–106)
Creatinine, Ser: 1.2 mg/dL (ref 0.76–1.27)
Glucose: 77 mg/dL (ref 70–99)
Potassium: 4.5 mmol/L (ref 3.5–5.2)
Sodium: 144 mmol/L (ref 134–144)
eGFR: 62 mL/min/{1.73_m2} (ref >59)

## 2021-06-14 ENCOUNTER — Encounter: Payer: Self-pay | Admitting: *Deleted

## 2021-06-23 ENCOUNTER — Telehealth (HOSPITAL_COMMUNITY): Payer: Self-pay | Admitting: *Deleted

## 2021-06-23 NOTE — Telephone Encounter (Signed)
**Note De-identified Backstrom Obfuscation** Attempted to call patient regarding upcoming cardiac CT appointment. °Left message on voicemail with name and callback number ° °Georgenia Salim RN Navigator Cardiac Imaging °[facility] °[phone] Office °[phone] Cell ° °

## 2021-06-23 NOTE — Telephone Encounter (Signed)
**Note De-Identified Virgen Obfuscation** Patient returning call regarding upcoming cardiac imaging study; pt verbalizes understanding of appt date/time, parking situation and where to check in, pre-test NPO status, and verified current allergies; name and call back number provided for further questions should they arise ? ?Daniel Clement RN Navigator Cardiac Imaging ?[facility] ?[phone] office ?[phone] cell ? ?Patient advised to not take metoprolol for test due to low HR from most recent OV.  He is aware to arrive at 2:45pm for his test. ?

## 2021-06-24 ENCOUNTER — Ambulatory Visit (HOSPITAL_COMMUNITY)
Admission: RE | Admit: 2021-06-24 | Discharge: 2021-06-24 | Disposition: A | Discharge status: Home / Self Care | Payer: PPO | Source: Ambulatory Visit | Attending: Cardiology | Admitting: Cardiology

## 2021-06-24 ENCOUNTER — Ambulatory Visit (HOSPITAL_BASED_OUTPATIENT_CLINIC_OR_DEPARTMENT_OTHER): Payer: PPO

## 2021-06-24 DIAGNOSIS — R0602 Shortness of breath: Secondary | ICD-10-CM

## 2021-06-24 DIAGNOSIS — I10 Essential (primary) hypertension: Secondary | ICD-10-CM

## 2021-06-24 DIAGNOSIS — I2510 Atherosclerotic heart disease of native coronary artery without angina pectoris: Secondary | ICD-10-CM | POA: Insufficient documentation

## 2021-06-24 DIAGNOSIS — R9431 Abnormal electrocardiogram [ECG] [EKG]: Secondary | ICD-10-CM | POA: Diagnosis not present

## 2021-06-24 DIAGNOSIS — E785 Hyperlipidemia, unspecified: Secondary | ICD-10-CM | POA: Diagnosis not present

## 2021-06-24 LAB — ECHOCARDIOGRAM COMPLETE
Area-P 1/2: 3.27 cm2
S' Lateral: 3.4 cm

## 2021-06-24 MED ORDER — NITROGLYCERIN 0.4 MG SL SUBL
SUBLINGUAL_TABLET | SUBLINGUAL | Status: AC
  Filled 2021-06-24: qty 2

## 2021-06-24 MED ORDER — NITROGLYCERIN 0.4 MG SL SUBL
0.8000 mg | SUBLINGUAL_TABLET | Freq: Once | SUBLINGUAL | Status: AC
Start: 2021-06-24 — End: 2021-06-24
  Administered 2021-06-24: 0.8 mg via SUBLINGUAL

## 2021-06-24 MED ORDER — IOHEXOL 350 MG/ML SOLN
95.0000 mL | Freq: Once | INTRAVENOUS | Status: AC | PRN
  Administered 2021-06-24: 95 mL via INTRAVENOUS

## 2021-06-29 ENCOUNTER — Encounter (INDEPENDENT_AMBULATORY_CARE_PROVIDER_SITE_OTHER): Payer: PPO | Admitting: Ophthalmology

## 2021-06-29 ENCOUNTER — Ambulatory Visit (INDEPENDENT_AMBULATORY_CARE_PROVIDER_SITE_OTHER): Payer: PPO | Admitting: Ophthalmology

## 2021-06-29 ENCOUNTER — Encounter (INDEPENDENT_AMBULATORY_CARE_PROVIDER_SITE_OTHER): Payer: Self-pay | Admitting: Ophthalmology

## 2021-06-29 DIAGNOSIS — H353221 Exudative age-related macular degeneration, left eye, with active choroidal neovascularization: Secondary | ICD-10-CM | POA: Diagnosis not present

## 2021-06-29 DIAGNOSIS — H3530 Unspecified macular degeneration: Secondary | ICD-10-CM | POA: Diagnosis not present

## 2021-06-29 MED ORDER — BEVACIZUMAB 2.5 MG/0.1ML IZ SOSY
2.5000 mg | PREFILLED_SYRINGE | INTRAVITREAL | Status: AC | PRN
  Administered 2021-06-29: 2.5 mg via INTRAVITREAL

## 2021-06-29 NOTE — Assessment & Plan Note (Signed)
**Note De-Identified Aicher Obfuscation** Peripapillary and and macular atrophy accounting for some vision in the right eye in the left ?

## 2021-06-29 NOTE — Progress Notes (Signed)
**Note De-Identified Nickless Obfuscation** ? ? ?06/29/2021 ? ?  ? ?CHIEF COMPLAINT ?Patient presents for  ?Chief Complaint  ?Patient presents with  ? Macular Degeneration  ? ? ? ? ?HISTORY OF PRESENT ILLNESS: ?Daniel Brock is a 76 y.o. male who presents to the clinic today for:  ? ?HPI   ?7 week dilate OU, Avastin OCT OS. ?Allergy to ciprofloxacin. ?Patient states vision is stable and unchanged since last visit. Denies any new floaters or FOL. ? ?Last edited by Laurin Coder on 06/29/2021  3:00 PM.  ?  ? ? ?Referring physician: ?Dettinger, Fransisca Kaufmann, MD ?[address] ?[place],  [place] [address] ? ?HISTORICAL INFORMATION:  ? ?Selected notes from the [facility] ?  ? ?Lab Results  ?Component Value Date  ? HGBA1C 5.6 10/30/2020  ?  ? ?CURRENT MEDICATIONS: ?No current outpatient medications on file. (Ophthalmic Drugs)  ? ?No current facility-administered medications for this visit. (Ophthalmic Drugs)  ? ?Current Outpatient Medications (Other)  ?Medication Sig  ? citalopram (CELEXA) 40 MG tablet Take 1 tablet (40 mg total) by mouth daily.  ? colchicine 0.6 MG tablet Take 1 tablet (0.6 mg total) by mouth daily as needed.  ? cyclobenzaprine (FLEXERIL) 10 MG tablet Take 10 mg by mouth daily as needed.  ? ezetimibe (ZETIA) 10 MG tablet Take 1 tablet (10 mg total) by mouth daily.  ? losartan (COZAAR) 25 MG tablet Take 1 tablet (25 mg total) by mouth daily.  ? metoprolol tartrate (LOPRESSOR) 50 MG tablet Take 1 tablet (50 mg total) by mouth as directed. Take 1 tablet 2 hours before your CT scan  ? OVER THE COUNTER MEDICATION Eye vitamin  ? rosuvastatin (CRESTOR) 20 MG tablet Take 1 tablet (20 mg total) by mouth daily.  ? tamsulosin (FLOMAX) 0.4 MG CAPS capsule Take 0.4 mg by mouth. Take two tablets by mouth daily  ? ?No current facility-administered medications for this visit. (Other)  ? ? ? ? ?REVIEW OF SYSTEMS: ?ROS   ?Negative for: Constitutional, Gastrointestinal, Neurological, Skin, Genitourinary, Musculoskeletal, HENT, Endocrine, Cardiovascular, Eyes,  Respiratory, Psychiatric, Allergic/Imm, Heme/Lymph ?Last edited by Hurman Horn, MD on 06/29/2021  3:48 PM.  ?  ? ? ? ?ALLERGIES ?Allergies  ?Allergen Reactions  ? Ciprofloxacin Nausea And Vomiting  ? Ciprofloxacin Hcl   ?  REACTION: NAUSIA AND VOMITING  ? Sulfamethoxazole-Trimethoprim   ?  REACTION: nausea and vomiting  ? Sulfonamide Derivatives   ?  REACTION: itching  ? Sulphur [Elemental Sulfur] Rash  ? ? ?PAST MEDICAL HISTORY ?Past Medical History:  ?Diagnosis Date  ? Allergy   ? Anxiety   ? Cataract   ? Chronic kidney disease   ? Depression 2016  ? Gout   ? Hiatal hernia   ? Hyperlipidemia   ? Hypertension   ? Renal insufficiency   ? Varicose veins   ? ?Past Surgical History:  ?Procedure Laterality Date  ? CARPAL TUNNEL RELEASE Left   ? CARPAL TUNNEL RELEASE Bilateral   ? CHOLECYSTECTOMY  2026  ? COLONOSCOPY    ? COLONOSCOPY W/ POLYPECTOMY    ? EYE SURGERY Bilateral 1958, 2004  ? cataracts  ? INGUINAL HERNIA REPAIR    ? JOINT REPLACEMENT  2002  ? TOTAL KNEE ARTHROPLASTY Right 2002  ? ulnar intrapment release  07/2008  ? VARICOSE VEIN SURGERY Left 1980's per patient  ? ? ?FAMILY HISTORY ?Family History  ?Problem Relation Age of Onset  ? Hypertension Mother   ? Hyperlipidemia Mother   ? Arthritis Mother   ? **Note De-Identified Gowens Obfuscation** Heart disease Mother 51  ?     CABG  ? Huntington's disease Mother   ? Cancer Father   ?     pancreatic cancer  ? Arthritis Father   ? Diabetes Father   ? Hypertension Father   ? Hyperlipidemia Sister   ? Hypertension Sister   ? Hypertension Brother   ? Vision loss Brother   ? Huntington's disease Brother   ? Heart disease Maternal Grandmother   ? Early death Maternal Grandfather   ? Colon cancer Neg Hx   ? Esophageal cancer Neg Hx   ? Rectal cancer Neg Hx   ? Stomach cancer Neg Hx   ? ? ?SOCIAL HISTORY ?Social History  ? ?Tobacco Use  ? Smoking status: Never  ? Smokeless tobacco: Never  ?Vaping Use  ? Vaping Use: Never used  ?Substance Use Topics  ? Alcohol use: No  ? Drug use: No  ? ?  ? ?  ? ?OPHTHALMIC  EXAM: ? ?Base Eye Exam   ? ? Visual Acuity (ETDRS)   ? ?   Right Left  ? Dist Ogle 20/125 -1 20/80 -2  ? Dist ph [place] NI 20/50 -2  ? ?  ?  ? ? Tonometry (Tonopen, 3:04 PM)   ? ?   Right Left  ? Pressure 13 10  ? ?  ?  ? ? Pupils   ? ?   Pupils Dark Light APD  ? Right PERRL 4 3 None  ? Left PERRL 4 3 None  ? ?  ?  ? ? Visual Fields   ? ?   Left Right  ? Restrictions Partial inner superior temporal, inferior temporal, superior nasal, inferior nasal deficiencies Partial inner superior temporal, inferior temporal, superior nasal, inferior nasal deficiencies  ? ?  ?  ? ? Extraocular Movement   ? ?   Right Left  ?  Full, Ortho Full, Ortho  ? ?  ?  ? ? Neuro/Psych   ? ? Oriented x3: Yes  ? Mood/Affect: Normal  ? ?  ?  ? ? Dilation   ? ? Both eyes: 1.0% Mydriacyl, 2.5% Phenylephrine @ 3:04 PM  ? ?  ?  ? ?  ? ?Slit Lamp and Fundus Exam   ? ? External Exam   ? ?   Right Left  ? External Normal Normal  ? ?  ?  ? ? Slit Lamp Exam   ? ?   Right Left  ? Lids/Lashes Normal Normal  ? Conjunctiva/Sclera White and quiet White and quiet  ? Cornea Clear Clear  ? Anterior Chamber Deep and quiet Deep and quiet  ? Iris Round and reactive Round and reactive  ? Lens  Centered posterior chamber intraocular lens  ? Anterior Vitreous Normal Normal  ? ?  ?  ? ? Fundus Exam   ? ?   Right Left  ? Posterior Vitreous Posterior vitreous detachment Posterior vitreous detachment  ? Disc Peripapillary atrophy,  Peripapillary atrophy,   ? C/D Ratio 0.55 0.55  ? Macula Geographic atrophy, into the faz Subretinal neovascular membrane pigmented, Subretinal fibrosis, no hemorrhage, Hard drusen, Retinal pigment epithelial atrophy - geographic, myopic type atrophy, no macular thickening  ? Vessels Normal Normal  ? Periphery Normal Normal  ? ?  ?  ? ?  ? ? ?IMAGING AND PROCEDURES  ?Imaging and Procedures for 06/29/21 ? ?Intravitreal Injection, Pharmacologic Agent - OS - Left Eye   ? ?   ?Time Out ?06/29/2021. **Note De-Identified Loeper Obfuscation** 3:50 PM. Confirmed correct patient, procedure, site, and  patient consented.  ? ?Anesthesia ?Topical anesthesia was used. Anesthetic medications included Lidocaine 4%.  ? ?Procedure ?Preparation included Tobramycin 0.3%, 10% betadine to eyelids, 5% betadine to ocular surface. A 30 gauge needle was used.  ? ?Injection: ?2.5 mg bevacizumab 2.5 MG/0.1ML ?  Route: Intravitreal, Site: Left Eye ?  [place]: [id number], Lot: [id number]  ? ?Post-op ?Post injection exam found visual acuity of at least counting fingers. The patient tolerated the procedure well. There were no complications. The patient received written and verbal post procedure care education. Post injection medications were not given.  ? ?  ? ?OCT, Retina - OU - Both Eyes   ? ?   ?Right Eye ?Quality was borderline. Scan locations included subfoveal. Central Foveal Thickness: 272. Progression has been stable. Findings include central retinal atrophy, outer retinal atrophy, subretinal scarring.  ? ?Left Eye ?Quality was borderline. Scan locations included subfoveal. Progression has been stable. Findings include choroidal neovascular membrane, epiretinal membrane, intraretinal fluid, cystoid macular edema.  ? ?Notes ?OD with central scarring from dry ARMD, myopic macular degeneration, increase atrophy over the last 3 years from prior OCT evaluation ? ?OS with slightly more active CNVM inferonasal and a second lesion superior to the fovea, incidental epiretinal membrane noted left eye, nonfoveal distorting, current currently at 7-week follow-up.  We will repeat injection today examination next in 7 weeks ? ? ?  ? ? ?  ?  ? ?  ?ASSESSMENT/PLAN: ? ?Exudative age-related macular degeneration of left eye with active choroidal neovascularization ([facility]) ?Chronic active OS, with increased intraretinal fluid, will maintain shorter interval of examination today currently at 7-week exam.  Pete Avastin to maintain visual functioning ? ?Myopic macular degeneration of both eyes ?Peripapillary and and macular atrophy accounting for some  vision in the right eye in the left  ? ?  ICD-10-CM   ?1. Exudative age-related macular degeneration of left eye with active choroidal neovascularization (HCC)  H35.3221 Intravitreal Injection, Pharmacologic A

## 2021-06-29 NOTE — Assessment & Plan Note (Signed)
**Note De-Identified Ullmer Obfuscation** Chronic active OS, with increased intraretinal fluid, will maintain shorter interval of examination today currently at 7-week exam.  Pete Avastin to maintain visual functioning ?

## 2021-07-26 NOTE — Progress Notes (Signed)
**Note De-Identified Dusing Obfuscation** ?  ?Cardiology Office Note ? ? ?Date:  07/28/2021  ? ?ID:  Daniel Brock, DOB Jun 05, 1945, MRN [id number] ? ?PCP:  Dettinger, Fransisca Kaufmann, MD  ?Cardiologist:   Kate Sable, MD (Inactive) ?Referring:  Dettinger, Fransisca Kaufmann, MD ? ?Chief Complaint  ?Patient presents with  ? Shortness of Breath  ? ? ?  ?History of Present Illness: ?Daniel Brock is a 76 y.o. male who presents for evaluation of shortness of breath.  He saw Dr. Bronson Ing last in 2020 for evaluation of SOB.   Perfusion study demonstrated a large defect of moderate severity in the mid inferoseptal, mid inferior inferolateral and apical, apical inferior and apical lateral locations..  He had an echo with NL LV function.   He was managed medically.   ? ?He had SOB and had an EF 50 - 55%.    There was moderate AV thickening.  He had very mild coronary plaque on a coronary CTA.  He said that since getting this information he has been out doing much more walking.  He thinks his breathing is getting better.  He can now walk a couple of miles without being short of breath.  He has not lost any weight but he really does not modify his diet.  However, his breathing is improved.  He is not describing any resting shortness of breath, PND or orthopnea.  He has not had any palpitations, presyncope or syncope.  He denies any chest pressure, neck or arm discomfort. ? ? ?Past Medical History:  ?Diagnosis Date  ? Allergy   ? Anxiety   ? Cataract   ? Chronic kidney disease   ? Depression 2016  ? Gout   ? Hiatal hernia   ? Hyperlipidemia   ? Hypertension   ? Renal insufficiency   ? Varicose veins   ? ? ?Past Surgical History:  ?Procedure Laterality Date  ? CARPAL TUNNEL RELEASE Left   ? CARPAL TUNNEL RELEASE Bilateral   ? CHOLECYSTECTOMY  2026  ? COLONOSCOPY    ? COLONOSCOPY W/ POLYPECTOMY    ? EYE SURGERY Bilateral 1958, 2004  ? cataracts  ? INGUINAL HERNIA REPAIR    ? JOINT REPLACEMENT  2002  ? TOTAL KNEE ARTHROPLASTY Right 2002  ? ulnar intrapment release  07/2008  ?  VARICOSE VEIN SURGERY Left 1980's per patient  ? ? ? ?Current Outpatient Medications  ?Medication Sig Dispense Refill  ? citalopram (CELEXA) 40 MG tablet Take 1 tablet (40 mg total) by mouth daily. 90 tablet 3  ? colchicine 0.6 MG tablet Take 1 tablet (0.6 mg total) by mouth daily as needed. 20 tablet 2  ? cyclobenzaprine (FLEXERIL) 10 MG tablet Take 10 mg by mouth daily as needed.    ? ezetimibe (ZETIA) 10 MG tablet Take 1 tablet (10 mg total) by mouth daily. 90 tablet 3  ? losartan (COZAAR) 25 MG tablet Take 1 tablet (25 mg total) by mouth daily. 90 tablet 3  ? rosuvastatin (CRESTOR) 20 MG tablet Take 1 tablet (20 mg total) by mouth daily. 90 tablet 3  ? tamsulosin (FLOMAX) 0.4 MG CAPS capsule Take 0.4 mg by mouth. Take two tablets by mouth daily    ? metoprolol tartrate (LOPRESSOR) 50 MG tablet Take 1 tablet (50 mg total) by mouth as directed. Take 1 tablet 2 hours before your CT scan (Patient not taking: Reported on 07/28/2021) 1 tablet 0  ? OVER THE COUNTER MEDICATION Eye vitamin (Patient not taking: Reported on 07/28/2021)    ? ? **Note De-Identified Kestner Obfuscation** No current facility-administered medications for this visit.  ? ? ?Allergies:   Ciprofloxacin, Ciprofloxacin hcl, Sulfamethoxazole-trimethoprim, Sulfonamide derivatives, and Sulphur [elemental sulfur]  ? ? ?ROS:  Please see the history of present illness.   Otherwise, review of systems are positive for none.   All other systems are reviewed and negative.  ? ? ?PHYSICAL EXAM: ?VS:  BP 116/72   Pulse 67   Ht '5\' 11"'$  (1.803 m)   Wt 247 lb 6.4 oz (112.2 kg)   SpO2 97%   BMI 34.51 kg/m?  , BMI Body mass index is 34.51 kg/m?. ?GENERAL:  Well appearing ?NECK:  No jugular venous distention, waveform within normal limits, carotid upstroke brisk and symmetric, no bruits, no thyromegaly ?LUNGS:  Clear to auscultation bilaterally ?CHEST:  Unremarkable ?HEART:  PMI not displaced or sustained,S1 and S2 within normal limits, no S3, no S4, no clicks, no rubs, no murmurs ?ABD:  Flat, positive bowel  sounds normal in frequency in pitch, no bruits, no rebound, no guarding, no midline pulsatile mass, no hepatomegaly, no splenomegaly ?EXT:  2 plus pulses throughout, no edema, no cyanosis no clubbing ? ? ?EKG:  EKG is not ordered today. ? ? ? ?Recent Labs: ?04/30/2021: ALT 13; Hemoglobin 14.5; Platelets 172 ?06/09/2021: BUN 17; Creatinine, Ser 1.20; Potassium 4.5; Sodium 144  ? ? ?Lipid Panel ?   ?Component Value Date/Time  ? CHOL 130 04/30/2021 1046  ? TRIG 196 (H) 04/30/2021 1046  ? HDL 37 (L) 04/30/2021 1046  ? CHOLHDL 3.5 04/30/2021 1046  ? CHOLHDL 6 03/01/2013 1139  ? VLDL 57.0 (H) 03/01/2013 1139  ? Gratis 61 04/30/2021 1046  ? LDLDIRECT 108 (H) 06/27/2016 1117  ? LDLDIRECT 160.9 03/01/2013 1139  ? ?  ? ?Wt Readings from Last 3 Encounters:  ?07/28/21 247 lb 6.4 oz (112.2 kg)  ?06/09/21 245 lb (111.1 kg)  ?04/30/21 238 lb (108 kg)  ?  ? ? ?Other studies Reviewed: ?Additional studies/ records that were reviewed today include: None ?Review of the above records demonstrates:  Please see elsewhere in the note.   ? ? ?ASSESSMENT AND PLAN: ? ?SOB:   This is improved.  I think with exercise and some weight loss if he continues to have increased shortness of breath I will do further evaluation.  However, I do not have a strong suspicion for cardiac etiology to this complaint at this point.  ? ?CKD IIIa:   Creatinine was 1.2.  No change in therapy. ? ?HTN: His blood pressure is dropping occasionally.  He is adjusting downward his dose of Flomax.  If he still has low blood pressures with this probably have to go down on his beta-blocker and he will let me know.  ? ?DYSLIPIDEMIA:    LDL 61 with an HDL of 37.  No change in therapy. ? ?DIZZINESS:    He does have some low blood pressures as mentioned but is going to manage it as above. ? ? ? ?Current medicines are reviewed at length with the patient today.  The patient does not have concerns regarding medicines. ? ?The following changes have been made:  None ? ?Labs/ tests  ordered today include:  None ? ?No orders of the defined types were placed in this encounter. ? ? ? ?Disposition:   FU with me in 12 months.  ? ? ?Signed, ?Minus Breeding, MD  ?07/28/2021 12:41 PM    ?[place] ? ? ? ?

## 2021-07-28 ENCOUNTER — Encounter: Payer: Self-pay | Admitting: Cardiology

## 2021-07-28 ENCOUNTER — Ambulatory Visit: Payer: PPO | Admitting: Cardiology

## 2021-07-28 VITALS — BP 116/72 | HR 67 | Ht 71.0 in | Wt 247.4 lb

## 2021-07-28 DIAGNOSIS — I10 Essential (primary) hypertension: Secondary | ICD-10-CM | POA: Diagnosis not present

## 2021-07-28 DIAGNOSIS — N1831 Chronic kidney disease, stage 3a: Secondary | ICD-10-CM

## 2021-07-28 DIAGNOSIS — R0602 Shortness of breath: Secondary | ICD-10-CM

## 2021-07-28 DIAGNOSIS — E785 Hyperlipidemia, unspecified: Secondary | ICD-10-CM

## 2021-07-28 NOTE — Patient Instructions (Signed)
**Note De-identified Klausner Obfuscation** Medication Instructions:  The current medical regimen is effective;  continue present plan and medications.  *If you need a refill on your cardiac medications before your next appointment, please call your pharmacy*  Follow-Up: At [facility], you and your health needs are our priority.  As part of our continuing mission to provide you with exceptional heart care, we have created designated Provider Care Teams.  These Care Teams include your primary Cardiologist (physician) and Advanced Practice Providers (APPs -  Physician Assistants and Nurse Practitioners) who all work together to provide you with the care you need, when you need it.  We recommend signing up for the patient portal called "MyChart".  Sign up information is provided on this After Visit Summary.  MyChart is used to connect with patients for Virtual Visits (Telemedicine).  Patients are able to view lab/test results, encounter notes, upcoming appointments, etc.  Non-urgent messages can be sent to your provider as well.   To learn more about what you can do with MyChart, go to [email or web address].    Your next appointment:   1 year(s)  The format for your next appointment:   In Person  Provider:   James Hochrein, MD{   Important Information About Sugar       

## 2021-08-11 ENCOUNTER — Other Ambulatory Visit: Payer: PPO

## 2021-08-11 DIAGNOSIS — E559 Vitamin D deficiency, unspecified: Secondary | ICD-10-CM | POA: Diagnosis not present

## 2021-08-11 DIAGNOSIS — N200 Calculus of kidney: Secondary | ICD-10-CM | POA: Diagnosis not present

## 2021-08-11 DIAGNOSIS — I129 Hypertensive chronic kidney disease with stage 1 through stage 4 chronic kidney disease, or unspecified chronic kidney disease: Secondary | ICD-10-CM | POA: Diagnosis not present

## 2021-08-11 DIAGNOSIS — E6609 Other obesity due to excess calories: Secondary | ICD-10-CM | POA: Diagnosis not present

## 2021-08-18 DIAGNOSIS — I129 Hypertensive chronic kidney disease with stage 1 through stage 4 chronic kidney disease, or unspecified chronic kidney disease: Secondary | ICD-10-CM | POA: Diagnosis not present

## 2021-08-18 DIAGNOSIS — D696 Thrombocytopenia, unspecified: Secondary | ICD-10-CM | POA: Diagnosis not present

## 2021-08-18 DIAGNOSIS — Z6834 Body mass index (BMI) 34.0-34.9, adult: Secondary | ICD-10-CM | POA: Diagnosis not present

## 2021-08-18 DIAGNOSIS — E559 Vitamin D deficiency, unspecified: Secondary | ICD-10-CM | POA: Diagnosis not present

## 2021-08-18 DIAGNOSIS — E870 Hyperosmolality and hypernatremia: Secondary | ICD-10-CM | POA: Diagnosis not present

## 2021-08-18 DIAGNOSIS — N200 Calculus of kidney: Secondary | ICD-10-CM | POA: Diagnosis not present

## 2021-08-24 ENCOUNTER — Encounter (INDEPENDENT_AMBULATORY_CARE_PROVIDER_SITE_OTHER): Payer: PPO | Admitting: Ophthalmology

## 2021-08-25 ENCOUNTER — Ambulatory Visit (INDEPENDENT_AMBULATORY_CARE_PROVIDER_SITE_OTHER): Payer: PPO | Admitting: Ophthalmology

## 2021-08-25 ENCOUNTER — Encounter (INDEPENDENT_AMBULATORY_CARE_PROVIDER_SITE_OTHER): Payer: Self-pay | Admitting: Ophthalmology

## 2021-08-25 DIAGNOSIS — H353221 Exudative age-related macular degeneration, left eye, with active choroidal neovascularization: Secondary | ICD-10-CM

## 2021-08-25 DIAGNOSIS — H3530 Unspecified macular degeneration: Secondary | ICD-10-CM

## 2021-08-25 DIAGNOSIS — H353114 Nonexudative age-related macular degeneration, right eye, advanced atrophic with subfoveal involvement: Secondary | ICD-10-CM | POA: Diagnosis not present

## 2021-08-25 MED ORDER — BEVACIZUMAB 2.5 MG/0.1ML IZ SOSY
2.5000 mg | PREFILLED_SYRINGE | INTRAVITREAL | Status: AC | PRN
  Administered 2021-08-25: 2.5 mg via INTRAVITREAL

## 2021-08-25 NOTE — Assessment & Plan Note (Signed)
**Note De-Identified Cerrone Obfuscation** Dilate OU next

## 2021-08-25 NOTE — Progress Notes (Signed)
**Note De-Identified Neuser Obfuscation** 08/25/2021     CHIEF COMPLAINT Patient presents for  Chief Complaint  Patient presents with   Macular Degeneration      HISTORY OF PRESENT ILLNESS: Daniel Brock is a 76 y.o. male who presents to the clinic today for:   HPI   8-week follow-up OS with a history of ARMD with a centrally a monocular patient.  Possible injection of Avastin OS today   Last edited by Hurman Horn, MD on 08/25/2021  9:22 AM.      Referring physician: Dettinger, Fransisca Kaufmann, MD [place],  [place] [id number]  HISTORICAL INFORMATION:   Selected notes from the MEDICAL RECORD NUMBER    Lab Results  Component Value Date   HGBA1C 5.6 10/30/2020     CURRENT MEDICATIONS: No current outpatient medications on file. (Ophthalmic Drugs)   No current facility-administered medications for this visit. (Ophthalmic Drugs)   Current Outpatient Medications (Other)  Medication Sig   citalopram (CELEXA) 40 MG tablet Take 1 tablet (40 mg total) by mouth daily.   colchicine 0.6 MG tablet Take 1 tablet (0.6 mg total) by mouth daily as needed.   cyclobenzaprine (FLEXERIL) 10 MG tablet Take 10 mg by mouth daily as needed.   ezetimibe (ZETIA) 10 MG tablet Take 1 tablet (10 mg total) by mouth daily.   losartan (COZAAR) 25 MG tablet Take 1 tablet (25 mg total) by mouth daily.   metoprolol tartrate (LOPRESSOR) 50 MG tablet Take 1 tablet (50 mg total) by mouth as directed. Take 1 tablet 2 hours before your CT scan (Patient not taking: Reported on 07/28/2021)   [place] vitamin (Patient not taking: Reported on 07/28/2021)   rosuvastatin (CRESTOR) 20 MG tablet Take 1 tablet (20 mg total) by mouth daily.   tamsulosin (FLOMAX) 0.4 MG CAPS capsule Take 0.4 mg by mouth. Take two tablets by mouth daily   No current facility-administered medications for this visit. (Other)      REVIEW OF SYSTEMS: ROS   Negative for: Constitutional, Gastrointestinal, Neurological, Skin, Genitourinary,  Musculoskeletal, HENT, Endocrine, Cardiovascular, Eyes, Respiratory, Psychiatric, Allergic/Imm, Heme/Lymph Last edited by Hurman Horn, MD on 08/25/2021  9:17 AM.       ALLERGIES Allergies  Allergen Reactions   Ciprofloxacin Nausea And Vomiting   Ciprofloxacin Hcl     REACTION: NAUSIA AND VOMITING   Sulfamethoxazole-Trimethoprim     REACTION: nausea and vomiting   Sulfonamide Derivatives     REACTION: itching   Sulphur [Elemental Sulfur] Rash    PAST MEDICAL HISTORY Past Medical History:  Diagnosis Date   Allergy    Anxiety    Cataract    Chronic kidney disease    Depression 2016   Gout    Hiatal hernia    Hyperlipidemia    Hypertension    Renal insufficiency    Varicose veins    Past Surgical History:  Procedure Laterality Date   CARPAL TUNNEL RELEASE Left    CARPAL TUNNEL RELEASE Bilateral    CHOLECYSTECTOMY  2026   COLONOSCOPY     COLONOSCOPY W/ POLYPECTOMY     EYE SURGERY Bilateral 1958, 2004   cataracts   INGUINAL HERNIA REPAIR     JOINT REPLACEMENT  2002   TOTAL KNEE ARTHROPLASTY Right 2002   ulnar intrapment release  07/2008   VARICOSE VEIN SURGERY Left 1980's per patient    FAMILY HISTORY Family History  Problem Relation Age of Onset   Hypertension Mother **Note De-Identified Perdomo Obfuscation** Hyperlipidemia Mother    Arthritis Mother    Heart disease Mother 22       CABG   Huntington's disease Mother    Cancer Father        pancreatic cancer   Arthritis Father    Diabetes Father    Hypertension Father    Hyperlipidemia Sister    Hypertension Sister    Hypertension Brother    Vision loss Brother    Huntington's disease Brother    Heart disease Maternal Grandmother    Early death Maternal Grandfather    Colon cancer Neg Hx    Esophageal cancer Neg Hx    Rectal cancer Neg Hx    Stomach cancer Neg Hx     SOCIAL HISTORY Social History   Tobacco Use   Smoking status: Never   Smokeless tobacco: Never  Vaping Use   Vaping Use: Never used  Substance Use Topics    Alcohol use: No   Drug use: No         OPHTHALMIC EXAM:  Base Eye Exam     Visual Acuity (ETDRS)       Right Left   Dist [place] 20/150 20/70   Dist ph   20/60 -1         Tonometry (Tonopen, 9:21 AM)       Right Left   Pressure 17 21         Pupils       Pupils   Right PERRL   Left PERRL         Extraocular Movement       Right Left    Full, Ortho Full, Ortho         Neuro/Psych     Oriented x3: Yes   Mood/Affect: Normal         Dilation     Left eye: 1.0% Mydriacyl, 2.5% Phenylephrine @ 9:21 AM           Slit Lamp and Fundus Exam     External Exam       Right Left   External Normal Normal         Slit Lamp Exam       Right Left   Lids/Lashes Normal Normal   Conjunctiva/Sclera White and quiet White and quiet   Cornea Clear Clear   Anterior Chamber Deep and quiet Deep and quiet   Iris Round and reactive Round and reactive   Lens  Centered posterior chamber intraocular lens   Anterior Vitreous Normal Normal         Fundus Exam       Right Left   Posterior Vitreous  Posterior vitreous detachment   Disc  Peripapillary atrophy,    C/D Ratio  0.55   Macula  Subretinal neovascular membrane pigmented, Subretinal fibrosis, no hemorrhage, Hard drusen, Retinal pigment epithelial atrophy - geographic, myopic type atrophy, no macular thickening   Vessels  Normal   Periphery  Normal            IMAGING AND PROCEDURES  Imaging and Procedures for 08/25/21  OCT, Retina - OU - Both Eyes       Right Eye Quality was borderline. Scan locations included subfoveal. Progression has been stable. Findings include central retinal atrophy, outer retinal atrophy, subretinal scarring.   Left Eye Quality was borderline. Scan locations included subfoveal. Progression has been stable. Findings include choroidal neovascular membrane, epiretinal membrane, intraretinal fluid, cystoid macular edema.   Notes OD with central scarring **Note De-Identified Jablonski Obfuscation** from dry  ARMD, myopic macular degeneration, increase atrophy over the last 3 years from prior OCT evaluation  OS with slightly more active CNVM inferonasal and a second lesion superior to the fovea, incidental epiretinal membrane noted left eye, nonfoveal distorting, current currently at 8-week follow-up.  We will repeat injection today examination next in 8 weeks      Intravitreal Injection, Pharmacologic Agent - OS - Left Eye       Time Out 08/25/2021. 9:22 AM. Confirmed correct patient, procedure, site, and patient consented.   Anesthesia Topical anesthesia was used. Anesthetic medications included Lidocaine 4%.   Procedure Preparation included Tobramycin 0.3%, 10% betadine to eyelids, 5% betadine to ocular surface. A 30 gauge needle was used.   Injection: 2.5 mg bevacizumab 2.5 MG/0.1ML   Route: Intravitreal, Site: Left Eye   NDC: [phone], Lot: [id number], Expiration date: 10/17/2021   Post-op Post injection exam found visual acuity of at least counting fingers. The patient tolerated the procedure well. There were no complications. The patient received written and verbal post procedure care education. Post injection medications included gentamicin.              ASSESSMENT/PLAN:  Exudative age-related macular degeneration of left eye with active choroidal neovascularization (HCC) OS today controlled at 8-week interval post Avastin.  With improved and stable acuity.  Recent recurrences at extended interval.  Thus we will repeat Avastin today and maintain 8-week follow-up interval  Myopic macular degeneration of both eyes Dilate OU next  Advanced nonexudative age-related macular degeneration of right eye with subfoveal involvement Atrophy accounts for acuity     ICD-10-CM   1. Exudative age-related macular degeneration of left eye with active choroidal neovascularization (HCC)  H35.3221 OCT, Retina - OU - Both Eyes    Intravitreal Injection, Pharmacologic Agent - OS - Left Eye     bevacizumab (AVASTIN) SOSY 2.5 mg    2. Myopic macular degeneration of both eyes  H35.30     3. Advanced nonexudative age-related macular degeneration of right eye with subfoveal involvement  H35.3114       OS, CNVM much less active disease now post Avastin at 8-week interval (as a compared to recurrence July 2022 at 12-week recurrence)  Repeat Avastin OS today and maintain 8-week evaluation interval  2.  Dilate OU next.  Likely Avastin OS  3.  Ophthalmic Meds Ordered this visit:  Meds ordered this encounter  Medications   bevacizumab (AVASTIN) SOSY 2.5 mg       Return in about 8 weeks (around 10/20/2021) for DILATE OU, AVASTIN OCT, OS.  There are no Patient Instructions on file for this visit.   Explained the diagnoses, plan, and follow up with the patient and they expressed understanding.  Patient expressed understanding of the importance of proper follow up care.   Clent Demark Shanna Un M.D. Diseases & Surgery of the Retina and Vitreous Retina & Diabetic [place] 08/25/21     Abbreviations: M myopia (nearsighted); A astigmatism; H hyperopia (farsighted); P presbyopia; Mrx spectacle prescription;  CTL contact lenses; OD right eye; OS left eye; OU both eyes  XT exotropia; ET esotropia; PEK punctate epithelial keratitis; PEE punctate epithelial erosions; DES dry eye syndrome; MGD meibomian gland dysfunction; ATs artificial tears; PFAT's preservative free artificial tears; [place] nuclear sclerotic cataract; PSC posterior subcapsular cataract; ERM epi-retinal membrane; PVD posterior vitreous detachment; RD retinal detachment; DM diabetes mellitus; DR diabetic retinopathy; NPDR non-proliferative diabetic retinopathy; PDR proliferative diabetic retinopathy; CSME clinically significant macular edema; DME diabetic **Note De-Identified Yetman Obfuscation** macular edema; dbh dot blot hemorrhages; CWS cotton wool spot; POAG primary open angle glaucoma; C/D cup-to-disc ratio; HVF humphrey visual field; GVF goldmann visual field; OCT  optical coherence tomography; IOP intraocular pressure; BRVO Branch retinal vein occlusion; CRVO central retinal vein occlusion; CRAO central retinal artery occlusion; BRAO branch retinal artery occlusion; RT retinal tear; SB scleral buckle; PPV pars plana vitrectomy; VH Vitreous hemorrhage; PRP panretinal laser photocoagulation; IVK intravitreal kenalog; VMT vitreomacular traction; MH Macular hole;  NVD neovascularization of the disc; NVE neovascularization elsewhere; AREDS age related eye disease study; ARMD age related macular degeneration; POAG primary open angle glaucoma; EBMD epithelial/anterior basement membrane dystrophy; ACIOL anterior chamber intraocular lens; IOL intraocular lens; PCIOL posterior chamber intraocular lens; Phaco/IOL phacoemulsification with intraocular lens placement; [place] photorefractive keratectomy; LASIK laser assisted in situ keratomileusis; HTN hypertension; DM diabetes mellitus; COPD chronic obstructive pulmonary disease

## 2021-08-25 NOTE — Assessment & Plan Note (Signed)
**Note De-identified Herber Obfuscation** Atrophy accounts for acuity 

## 2021-08-25 NOTE — Assessment & Plan Note (Signed)
**Note De-Identified Skillman Obfuscation** OS today controlled at 8-week interval post Avastin.  With improved and stable acuity.  Recent recurrences at extended interval.  Thus we will repeat Avastin today and maintain 8-week follow-up interval

## 2021-09-15 ENCOUNTER — Ambulatory Visit (INDEPENDENT_AMBULATORY_CARE_PROVIDER_SITE_OTHER): Payer: PPO | Admitting: Family Medicine

## 2021-09-15 ENCOUNTER — Encounter: Payer: Self-pay | Admitting: Family Medicine

## 2021-09-15 ENCOUNTER — Ambulatory Visit (INDEPENDENT_AMBULATORY_CARE_PROVIDER_SITE_OTHER): Payer: PPO

## 2021-09-15 VITALS — BP 121/74 | HR 91 | Temp 98.1°F | Ht 71.0 in | Wt 245.0 lb

## 2021-09-15 DIAGNOSIS — R350 Frequency of micturition: Secondary | ICD-10-CM | POA: Diagnosis not present

## 2021-09-15 DIAGNOSIS — M5136 Other intervertebral disc degeneration, lumbar region: Secondary | ICD-10-CM | POA: Diagnosis not present

## 2021-09-15 DIAGNOSIS — M545 Low back pain, unspecified: Secondary | ICD-10-CM | POA: Diagnosis not present

## 2021-09-15 LAB — URINALYSIS, ROUTINE W REFLEX MICROSCOPIC
Bilirubin, UA: NEGATIVE
Glucose, UA: NEGATIVE
Ketones, UA: NEGATIVE
Leukocytes,UA: NEGATIVE
Nitrite, UA: NEGATIVE
Protein,UA: NEGATIVE
RBC, UA: NEGATIVE
Specific Gravity, UA: 1.02 (ref 1.005–1.030)
Urobilinogen, Ur: 0.2 mg/dL (ref 0.2–1.0)
pH, UA: 5 (ref 5.0–7.5)

## 2021-09-15 MED ORDER — HYDROCODONE-ACETAMINOPHEN 5-325 MG PO TABS: 1.0000 | ORAL_TABLET | Freq: Four times a day (QID) | ORAL | 0 refills | Status: AC | PRN

## 2021-09-15 MED ORDER — METHYLPREDNISOLONE ACETATE 80 MG/ML IJ SUSP
80.0000 mg | Freq: Once | INTRAMUSCULAR | Status: AC
  Administered 2021-09-15: 80 mg via INTRAMUSCULAR

## 2021-09-15 NOTE — Progress Notes (Unsigned)
**Note De-Identified Kratz Obfuscation** Established Patient Office Visit  Subjective   Patient ID: Daniel Brock, male    DOB: 02/18/1946  Age: 76 y.o. MRN: [id number]  Chief Complaint  Patient presents with   Back Pain    Patient presents to the clinic today with complaints of lower back pain for the past 6 weeks. States pain shoots down both legs intermittently. States pain when sitting is 8/10, when moving and bending pain is 9/10. States has tried heating pad, flexeril, and tylenol with Daniel relief. He has a history of chronic back pain. Hydrocodone has worked well for him in the past for short periods when the pain intensifies. He is not established with ortho but is interested in epidural injections.   He reports increased urinary frequency for the last few weeks. Denies dysuria, incontinence, urinary odor, fever, saddle anesthesia, or numbness/tingling. He has a history of BPH and takes flomax for this.   Back Pain Pertinent negatives include no abdominal pain, bladder incontinence, chest pain, dysuria, fever, headaches, leg pain, numbness, paresis, paresthesias, perianal numbness, tingling, weakness or weight loss.    Past Medical History:  Diagnosis Date   Allergy    Anxiety    Cataract    Chronic kidney disease    Depression 2016   Gout    Hiatal hernia    Hyperlipidemia    Hypertension    Renal insufficiency    Varicose veins       Review of Systems  Constitutional:  Negative for chills, fever and weight loss.  HENT:  Negative for ear discharge, ear pain and hearing loss.   Eyes:  Negative for blurred vision and double vision.  Respiratory:  Negative for cough and shortness of breath.   Cardiovascular:  Negative for chest pain and palpitations.  Gastrointestinal:  Negative for abdominal pain, diarrhea, heartburn and vomiting.  Genitourinary:  Negative for bladder incontinence, dysuria, frequency and urgency.  Musculoskeletal:  Positive for back pain.  Skin:  Negative for itching and rash.   Neurological:  Negative for dizziness, tingling, tremors, weakness, numbness, headaches and paresthesias.  Endo/Heme/Allergies:  Negative for polydipsia. Does not bruise/bleed easily.  Psychiatric/Behavioral:  Negative for depression. The patient is not nervous/anxious.       Objective:     BP 121/74   Pulse 91   Temp 98.1 F (36.7 C) (Temporal)   Ht '5\' 11"'$  (1.803 m)   Wt 111.1 kg   SpO2 95%   BMI 34.17 kg/m  BP Readings from Last 3 Encounters:  09/15/21 121/74  07/28/21 116/72  06/24/21 124/76      Physical Exam Constitutional:      Appearance: Normal appearance. He is obese.  HENT:     Head: Normocephalic and atraumatic.     Right Ear: Tympanic membrane, ear canal and external ear normal.     Left Ear: Tympanic membrane, ear canal and external ear normal.     Nose: Nose normal.     Mouth/Throat:     Mouth: Mucous membranes are moist.  Eyes:     Pupils: Pupils are equal, round, and reactive to light.  Cardiovascular:     Rate and Rhythm: Normal rate and regular rhythm.     Pulses: Normal pulses.     Heart sounds: Normal heart sounds.  Pulmonary:     Effort: Pulmonary effort is normal.     Breath sounds: Normal breath sounds.  Abdominal:     Palpations: Abdomen is soft.  Musculoskeletal: **Note De-Identified Buell Obfuscation** General: Normal range of motion.     Cervical back: Normal range of motion and neck supple.     Lumbar back: No swelling, edema, deformity, signs of trauma, spasms, tenderness or bony tenderness. Normal range of motion. Negative right straight leg raise test and negative left straight leg raise test.  Skin:    General: Skin is warm and dry.     Capillary Refill: Capillary refill takes less than 2 seconds.  Neurological:     General: No focal deficit present.     Mental Status: He is alert and oriented to person, place, and time.     Sensory: No sensory deficit.     Motor: No weakness.     Gait: Gait normal.  Psychiatric:        Mood and Affect: Mood normal.         Behavior: Behavior normal.        Thought Content: Thought content normal.        Judgment: Judgment normal.      No results found for any visits on 09/15/21.    The 10-year ASCVD risk score (Arnett DK, et al., 2019) is: 28.5%    Assessment & Plan:   Daniel Brock was seen today for back pain.  Diagnoses and all orders for this visit:  DDD (degenerative disc disease), lumbar Chronic with increased pain for 6 weeks. Xray negative for acute finding, DDD stable. PDMP reviewed, no red flags. Norco ordered for some short term relief, patient is aware that this will not be refilled. Will refer to ortho for epidural injections.  -     methylPREDNISolone acetate (DEPO-MEDROL) injection 80 mg -     HYDROcodone-acetaminophen (NORCO) 5-325 MG tablet; Take 1 tablet by mouth every 6 (six) hours as needed for up to 5 days for moderate pain. -     DG Lumbar Spine 2-3 Views; Future  Urinary frequency Hx of BPH, on flomax. Negative UA today.  -     Urinalysis, Routine w reflex microscopic  Kathlen Mody, RN, FNP student  I personally was present during the history, physical exam, and medical decision-making activities of this service and have verified that the service and findings are accurately documented in the nurse practitioner student's note.  Marjorie Smolder, FNP

## 2021-09-15 NOTE — Patient Instructions (Signed)
**Note De-identified [place] [employer]** Chronic Back Pain When back pain lasts longer than 3 months, it is called chronic back pain. The cause of your back pain may not be known. Some common causes include: Wear and tear (degenerative disease) of the bones, ligaments, or disks in your back. Inflammation and stiffness in your back (arthritis). People who have chronic back pain often go through certain periods in which the pain is more intense (flare-ups). Many people can learn to manage the pain with home care. Follow these instructions at home: Pay attention to any changes in your symptoms. Take these actions to help with your pain: Managing pain and stiffness     If directed, apply ice to the painful area. Your health care provider may recommend applying ice during the first 24-48 hours after a flare-up begins. To do this: Put ice in a plastic bag. Place a towel between your skin and the bag. Leave the ice on for 20 minutes, 2-3 times per day. If directed, apply heat to the affected area as often as told by your health care provider. Use the heat source that your health care provider recommends, such as a moist heat pack or a heating pad. Place a towel between your skin and the heat source. Leave the heat on for 20-30 minutes. Remove the heat if your skin turns bright red. This is especially important if you are unable to feel pain, heat, or cold. You may have a greater risk of getting burned. Try soaking in a warm tub. Activity  Avoid bending and other activities that make the problem worse. Maintain a proper position when standing or sitting: When standing, keep your upper back and neck straight, with your shoulders pulled back. Avoid slouching. When sitting, keep your back straight and relax your shoulders. Do not round your shoulders or pull them backward. Do not sit or stand in one place for long periods of time. Take brief periods of rest throughout the day. This will reduce your pain. Resting in a lying or standing  position is usually better than sitting to rest. When you are resting for longer periods, mix in some mild activity or stretching between periods of rest. This will help to prevent stiffness and pain. Get regular exercise. Ask your health care provider what activities are safe for you. Do not lift anything that is heavier than 10 lb (4.5 kg), or the limit that you are told, until your health care provider says that it is safe. Always use proper lifting technique, which includes: Bending your knees. Keeping the load close to your body. Avoiding twisting. Sleep on a firm mattress in a comfortable position. Try lying on your side with your knees slightly bent. If you lie on your back, put a pillow under your knees. Medicines Treatment may include medicines for pain and inflammation taken by mouth or applied to the skin, prescription pain medicine, or muscle relaxants. Take over-the-counter and prescription medicines only as told by your health care provider. Ask your health care provider if the medicine prescribed to you: Requires you to avoid driving or using machinery. Can cause constipation. You may need to take these actions to prevent or treat constipation: Drink enough fluid to keep your urine pale yellow. Take over-the-counter or prescription medicines. Eat foods that are high in fiber, such as beans, whole grains, and fresh fruits and vegetables. Limit foods that are high in fat and processed sugars, such as fried or sweet foods. General instructions Do not use any products that contain nicotine or  **Note De-identified [place] [employer]ation** tobacco, such as cigarettes, e-cigarettes, and chewing tobacco. If you need help quitting, ask your health care provider. Keep all follow-up visits as told by your health care provider. This is important. Contact a health care provider if: You have pain that is not relieved with rest or medicine. Your pain gets worse, or you have new pain. You have a high fever. You have rapid weight  loss. You have trouble doing your normal activities. Get help right away if: You have weakness or numbness in one or both of your legs or feet. You have trouble controlling your bladder or your bowels. You have severe back pain and have any of the following: Nausea or vomiting. Pain in your abdomen. Shortness of breath or you faint. Summary Chronic back pain is back pain that lasts longer than 3 months. When a flare-up begins, apply ice to the painful area for the first 24-48 hours. Apply a moist heat pad or use a heating pad on the painful area as directed by your health care provider. When you are resting for longer periods, mix in some mild activity or stretching between periods of rest. This will help to prevent stiffness and pain. This information is not intended to replace advice given to you by your health care provider. Make sure you discuss any questions you have with your health care provider. Document Revised: 04/17/2019 Document Reviewed: 04/17/2019 [employer] Patient Education  2023 [employer]  

## 2021-09-16 ENCOUNTER — Encounter: Payer: Self-pay | Admitting: Family Medicine

## 2021-09-16 ENCOUNTER — Other Ambulatory Visit: Payer: Self-pay | Admitting: Family Medicine

## 2021-09-16 DIAGNOSIS — M5136 Other intervertebral disc degeneration, lumbar region: Secondary | ICD-10-CM

## 2021-09-23 ENCOUNTER — Telehealth: Payer: Self-pay | Admitting: Family Medicine

## 2021-09-23 NOTE — Telephone Encounter (Signed)
**Note De-identified [place] [employer]**  **Note De-Identified [place] [employer]** Left message for patient to call back and schedule Medicare Annual Wellness Visit (AWV) to be completed by video or phone.  No hx of AWV eligible for AWVI per [facility] as of 08/19/2021  Please schedule at anytime with [facility]   45  Minutes appointment   Any questions, please call me at [phone]

## 2021-09-24 ENCOUNTER — Ambulatory Visit (INDEPENDENT_AMBULATORY_CARE_PROVIDER_SITE_OTHER): Payer: PPO

## 2021-09-24 VITALS — Wt 245.0 lb

## 2021-09-24 DIAGNOSIS — Z0000 Encounter for general adult medical examination without abnormal findings: Secondary | ICD-10-CM | POA: Diagnosis not present

## 2021-09-24 NOTE — Patient Instructions (Addendum)
**Note De-Identified Febles Obfuscation** Mr. Daniel Brock , Thank you for taking time to come for your Medicare Wellness Visit. I appreciate your ongoing commitment to your health goals. Please review the following plan we discussed and let me know if I can assist you in the future.   Screening recommendations/referrals: Colonoscopy: Done 01/04/2017 - Repeat in 5 years *this year - consider Recommended yearly ophthalmology/optometry visit for glaucoma screening and checkup Recommended yearly dental visit for hygiene and checkup  Vaccinations: Influenza vaccine: Done 04/30/2021 - repeat in fall Pneumococcal vaccine: Done 08/30/2013 & 03/07/2016 Tdap vaccine: Done 03/04/2009 - Repeat in 10 years *due Shingles vaccine: Zostavax done 2016 - due for Shingri   Covid-19: Done 05/19/2019, 06/18/2019, & 02/05/2020  Advanced directives: Advance directive discussed with you today. Even though you declined this today, please call our office should you change your mind, and we can give you the proper paperwork for you to fill out.   Conditions/risks identified: Aim for 6-8 glasses of water daily, increase lean protein and non-starchy vegetables in your diet and try to get up and walk/ stretch every hour for 5-10 minutes at a time. Hopefully your back will get better and you can start walking/ exercising 30 minutes each day  Next appointment: Follow up in one year for your annual wellness visit.   Preventive Care 77 Years and Older, Male  Preventive care refers to lifestyle choices and visits with your health care provider that can promote health and wellness. What does preventive care include? A yearly physical exam. This is also called an annual well check. Dental exams once or twice a year. Routine eye exams. Ask your health care provider how often you should have your eyes checked. Personal lifestyle choices, including: Daily care of your teeth and gums. Regular physical activity. Eating a healthy diet. Avoiding tobacco and drug use. Limiting  alcohol use. Practicing safe sex. Taking low doses of aspirin every day. Taking vitamin and mineral supplements as recommended by your health care provider. What happens during an annual well check? The services and screenings done by your health care provider during your annual well check will depend on your age, overall health, lifestyle risk factors, and family history of disease. Counseling  Your health care provider may ask you questions about your: Alcohol use. Tobacco use. Drug use. Emotional well-being. Home and relationship well-being. Sexual activity. Eating habits. History of falls. Memory and ability to understand (cognition). Work and work Statistician. Screening  You may have the following tests or measurements: Height, weight, and BMI. Blood pressure. Lipid and cholesterol levels. These may be checked every 5 years, or more frequently if you are over 95 years old. Skin check. Lung cancer screening. You may have this screening every year starting at age 81 if you have a 30-pack-year history of smoking and currently smoke or have quit within the past 15 years. Fecal occult blood test (FOBT) of the stool. You may have this test every year starting at age 39. Flexible sigmoidoscopy or colonoscopy. You may have a sigmoidoscopy every 5 years or a colonoscopy every 10 years starting at age 34. Prostate cancer screening. Recommendations will vary depending on your family history and other risks. Hepatitis C blood test. Hepatitis B blood test. Sexually transmitted disease (STD) testing. Diabetes screening. This is done by checking your blood sugar (glucose) after you have not eaten for a while (fasting). You may have this done every 1-3 years. Abdominal aortic aneurysm (AAA) screening. You may need this if you are a current or **Note De-Identified Zeitler Obfuscation** former smoker. Osteoporosis. You may be screened starting at age 87 if you are at high risk. Talk with your health care provider about your test results,  treatment options, and if necessary, the need for more tests. Vaccines  Your health care provider may recommend certain vaccines, such as: Influenza vaccine. This is recommended every year. Tetanus, diphtheria, and acellular pertussis (Tdap, Td) vaccine. You may need a Td booster every 10 years. Zoster vaccine. You may need this after age 93. Pneumococcal 13-valent conjugate (PCV13) vaccine. One dose is recommended after age 51. Pneumococcal polysaccharide (PPSV23) vaccine. One dose is recommended after age 24. Talk to your health care provider about which screenings and vaccines you need and how often you need them. This information is not intended to replace advice given to you by your health care provider. Make sure you discuss any questions you have with your health care provider. Document Released: 04/03/2015 Document Revised: 11/25/2015 Document Reviewed: 01/06/2015 Elsevier Interactive Patient Education  2017 Hurt Prevention in the Home Falls can cause injuries. They can happen to people of all ages. There are many things you can do to make your home safe and to help prevent falls. What can I do on the outside of my home? Regularly fix the edges of walkways and driveways and fix any cracks. Remove anything that might make you trip as you walk through a door, such as a raised step or threshold. Trim any bushes or trees on the path to your home. Use bright outdoor lighting. Clear any walking paths of anything that might make someone trip, such as rocks or tools. Regularly check to see if handrails are loose or broken. Make sure that both sides of any steps have handrails. Any raised decks and porches should have guardrails on the edges. Have any leaves, snow, or ice cleared regularly. Use sand or salt on walking paths during winter. Clean up any spills in your garage right away. This includes oil or grease spills. What can I do in the bathroom? Use night  lights. Install grab bars by the toilet and in the tub and shower. Do not use towel bars as grab bars. Use non-skid mats or decals in the tub or shower. If you need to sit down in the shower, use a plastic, non-slip stool. Keep the floor dry. Clean up any water that spills on the floor as soon as it happens. Remove soap buildup in the tub or shower regularly. Attach bath mats securely with double-sided non-slip rug tape. Do not have throw rugs and other things on the floor that can make you trip. What can I do in the bedroom? Use night lights. Make sure that you have a light by your bed that is easy to reach. Do not use any sheets or blankets that are too big for your bed. They should not hang down onto the floor. Have a firm chair that has side arms. You can use this for support while you get dressed. Do not have throw rugs and other things on the floor that can make you trip. What can I do in the kitchen? Clean up any spills right away. Avoid walking on wet floors. Keep items that you use a lot in easy-to-reach places. If you need to reach something above you, use a strong step stool that has a grab bar. Keep electrical cords out of the way. Do not use floor polish or wax that makes floors slippery. If you must use wax, use **Note De-Identified [place] Obfuscation** non-skid floor wax. Do not have throw rugs and other things on the floor that can make you trip. What can I do with my stairs? Do not leave any items on the stairs. Make sure that there are handrails on both sides of the stairs and use them. Fix handrails that are broken or loose. Make sure that handrails are as long as the stairways. Check any carpeting to make sure that it is firmly attached to the stairs. Fix any carpet that is loose or worn. Avoid having throw rugs at the top or bottom of the stairs. If you do have throw rugs, attach them to the floor with carpet tape. Make sure that you have a light switch at the top of the stairs and the bottom of the stairs. If  you do not have them, ask someone to add them for you. What else can I do to help prevent falls? Wear shoes that: Do not have high heels. Have rubber bottoms. Are comfortable and fit you well. Are closed at the toe. Do not wear sandals. If you use a stepladder: Make sure that it is fully opened. Do not climb a closed stepladder. Make sure that both sides of the stepladder are locked into place. Ask someone to hold it for you, if possible. Clearly mark and make sure that you can see: Any grab bars or handrails. First and last steps. Where the edge of each step is. Use tools that help you move around (mobility aids) if they are needed. These include: Canes. Walkers. Scooters. Crutches. Turn on the lights when you go into a dark area. Replace any light bulbs as soon as they burn out. Set up your furniture so you have a clear path. Avoid moving your furniture around. If any of your floors are uneven, fix them. If there are any pets around you, be aware of where they are. Review your medicines with your doctor. Some medicines can make you feel dizzy. This can increase your chance of falling. Ask your doctor what other things that you can do to help prevent falls. This information is not intended to replace advice given to you by your health care provider. Make sure you discuss any questions you have with your health care provider. Document Released: 01/01/2009 Document Revised: 08/13/2015 Document Reviewed: 04/11/2014 Elsevier Interactive Patient Education  2017 [employer]   Degenerative Disk Disease  Degenerative disk disease is a condition caused by changes that occur in the spinal disks as a person ages. Spinal disks are soft and compressible disks located between the bones of your spine (vertebrae). These disks act like shock absorbers. Degenerative disk disease can affect the whole spine. However, the neck and lower back are most often affected. Many changes can occur in the  spinal disks with aging, such as: The spinal disks may dry and shrink. Small tears may occur in the tough, outer covering of the disk (annulus). The disk space may become smaller due to loss of water. Abnormal growths in the bone (spurs) may occur. This can put pressure on the nerve roots exiting the spinal canal, causing pain. The spinal canal may become narrowed. What are the causes? This condition may be caused by: Normal degeneration with age. Injuries. Certain activities and sports that cause damage. What increases the risk? The following factors may make you more likely to develop this condition: Being overweight. Having a family history of degenerative disk disease. Smoking and use of products that contain nicotine and tobacco. Sudden injury. **Note De-Identified Parker Obfuscation** Doing work that requires heavy lifting. What are the signs or symptoms? Symptoms of this condition include: Pain that varies in intensity. Some people have no pain, while others have severe pain. The location of the pain depends on the part of your backbone that is affected. You may have: Pain in your neck or arm if a disk in your neck area is affected. Pain in your back, buttocks, or legs if a disk in your lower back is affected. Pain that becomes worse while bending or reaching up, or with twisting movements. Pain that may start gradually and worsen as time passes. It may also start after a major or minor injury. Numbness or tingling in the arms or legs. How is this diagnosed? This condition may be diagnosed based on: Your symptoms and medical history. A physical exam. Imaging tests, including: X-ray of the spine. CT scan. MRI. How is this treated? This condition may be treated with: Medicines. Injection of steroids into the back. Rehabilitation exercises. These activities aim to strengthen muscles in your back and abdomen to better support your spine. If treatments do not help to relieve your symptoms or you have severe pain, you  may need surgery. Follow these instructions at home: Medicines Take over-the-counter and prescription medicines only as told by your health care provider. Ask your health care provider if the medicine prescribed to you: Requires you to avoid driving or using machinery. Can cause constipation. You may need to take these actions to prevent or treat constipation: Drink enough fluid to keep your urine pale yellow. Take over-the-counter or prescription medicines. Eat foods that are high in fiber, such as beans, whole grains, and fresh fruits and vegetables. Limit foods that are high in fat and processed sugars, such as fried or sweet foods. Activity Rest as told by your health care provider. Avoid sitting for a long time without moving. Get up to take short walks every 1-2 hours. This is important to improve blood flow and breathing. Ask for help if you feel weak or unsteady. Return to your normal activities as told by your health care provider. Ask your health care provider what activities are safe for you. Perform relaxation exercises as told by your health care provider. Maintain good posture. Do not lift anything that is heavier than 10 lb (4.5 kg), or the limit that you are told, until your health care provider says that it is safe. Follow proper lifting and walking techniques as told by your health care provider. Managing pain, stiffness, and swelling     If directed, put ice on the painful area. Icing can help to relieve pain. To do this: Put ice in a plastic bag. Place a towel between your skin and the bag. Leave the ice on for 20 minutes, 2-3 times a day. Remove the ice if your skin turns bright red. This is very important. If you cannot feel pain, heat, or cold, you have a greater risk of damage to the area. If directed, apply heat to the painful area as often as told by your health care provider. Heat can reduce the stiffness of your muscles. Use the heat source that your health  care provider recommends, such as a moist heat pack or a heating pad. Place a towel between your skin and the heat source. Leave the heat on for 20-30 minutes. Remove the heat if your skin turns bright red. This is especially important if you are unable to feel pain, heat, or cold. You may have a **Note De-Identified Keisling Obfuscation** greater risk of getting burned. General instructions Change your sitting, standing, and sleeping habits as told by your health care provider. Avoid sitting in the same position for long periods of time. Change positions frequently. Lose weight or maintain a healthy weight as told by your health care provider. Do not use any products that contain nicotine or tobacco, such as cigarettes, e-cigarettes, and chewing tobacco. If you need help quitting, ask your health care provider. Wear supportive footwear. Keep all follow-up visits. This is important. This may include visits for physical therapy. Contact a health care provider if you: Have pain that does not go away within 1-4 weeks. Lose your appetite. Lose weight without trying. Get help right away if you: Have severe pain. Notice weakness in your arms, hands, or legs. Begin to lose control of your bladder or bowel movements. Have fevers or night sweats. Summary Degenerative disk disease is a condition caused by changes that occur in the spinal disks as a person ages. This condition can affect the whole spine. However, the neck and lower back are most often affected. Take over-the-counter and prescription medicines only as told by your health care provider. This information is not intended to replace advice given to you by your health care provider. Make sure you discuss any questions you have with your health care provider. Document Revised: 06/20/2019 Document Reviewed: 06/20/2019 Elsevier Patient Education  [place].  Managing Pain Without Opioids Opioids are strong medicines used to treat moderate to severe pain. For some people,  especially those who have long-term (chronic) pain, opioids may not be the best choice for pain management due to: Side effects like nausea, constipation, and sleepiness. The risk of addiction (opioid use disorder). The longer you take opioids, the greater your risk of addiction. Pain that lasts for more than 3 months is called chronic pain. Managing chronic pain usually requires more than one approach and is often provided by a team of health care providers working together (multidisciplinary approach). Pain management may be done at a pain management center or pain clinic. How to manage pain without the use of opioids Use non-opioid medicines Non-opioid medicines for pain may include: Over-the-counter or prescription non-steroidal anti-inflammatory drugs (NSAIDs). These may be the first medicines used for pain. They work well for muscle and bone pain, and they reduce swelling. Acetaminophen. This over-the-counter medicine may work well for milder pain but not swelling. Antidepressants. These may be used to treat chronic pain. A certain type of antidepressant (tricyclics) is often used. These medicines are given in lower doses for pain than when used for depression. Anticonvulsants. These are usually used to treat seizures but may also reduce nerve (neuropathic) pain. Muscle relaxants. These relieve pain caused by sudden muscle tightening (spasms). You may also use a pain medicine that is applied to the skin as a patch, cream, or gel (topical analgesic), such as a numbing medicine. These may cause fewer side effects than medicines taken by mouth. Do certain therapies as directed Some therapies can help with pain management. They include: Physical therapy. You will do exercises to gain strength and flexibility. A physical therapist may teach you exercises to move and stretch parts of your body that are weak, stiff, or painful. You can learn these exercises at physical therapy visits and practice them  at home. Physical therapy may also involve: Massage. Heat wraps or applying heat or cold to affected areas. Electrical signals that interrupt pain signals (transcutaneous electrical nerve stimulation, TENS). Weak lasers that reduce **Note De-Identified Faller Obfuscation** pain and swelling (low-level laser therapy). Signals from your body that help you learn to regulate pain (biofeedback). Occupational therapy. This helps you to learn ways to function at home and work with less pain. Recreational therapy. This involves trying new activities or hobbies, such as a physical activity or drawing. Mental health therapy, including: Cognitive behavioral therapy (CBT). This helps you learn coping skills for dealing with pain. Acceptance and commitment therapy (ACT) to change the way you think and react to pain. Relaxation therapies, including muscle relaxation exercises and mindfulness-based stress reduction. Pain management counseling. This may be individual, family, or group counseling.  Receive medical treatments Medical treatments for pain management include: Nerve block injections. These may include a pain blocker and anti-inflammatory medicines. You may have injections: Near the spine to relieve chronic back or neck pain. Into joints to relieve back or joint pain. Into nerve areas that supply a painful area to relieve body pain. Into muscles (trigger point injections) to relieve some painful muscle conditions. A medical device placed near your spine to help block pain signals and relieve nerve pain or chronic back pain (spinal cord stimulation device). Acupuncture. Follow these instructions at home Medicines Take over-the-counter and prescription medicines only as told by your health care provider. If you are taking pain medicine, ask your health care providers about possible side effects to watch out for. Do not drive or use heavy machinery while taking prescription opioid pain medicine. Lifestyle  Do not use drugs or alcohol to  reduce pain. If you drink alcohol, limit how much you have to: 0-1 drink a day for women who are not pregnant. 0-2 drinks a day for men. Know how much alcohol is in a drink. In the [place] one drink equals one 12 oz bottle of beer (355 mL), one 5 oz glass of wine (148 mL), or one 1 oz glass of hard liquor (44 mL). Do not use any products that contain nicotine or tobacco. These products include cigarettes, chewing tobacco, and vaping devices, such as e-cigarettes. If you need help quitting, ask your health care provider. Eat a healthy diet and maintain a healthy weight. Poor diet and excess weight may make pain worse. Eat foods that are high in fiber. These include fresh fruits and vegetables, whole grains, and beans. Limit foods that are high in fat and processed sugars, such as fried and sweet foods. Exercise regularly. Exercise lowers stress and may help relieve pain. Ask your health care provider what activities and exercises are safe for you. If your health care provider approves, join an exercise class that combines movement and stress reduction. Examples include yoga and tai chi. Get enough sleep. Lack of sleep may make pain worse. Lower stress as much as possible. Practice stress reduction techniques as told by your therapist. General instructions Work with all your pain management providers to find the treatments that work best for you. You are an important member of your pain management team. There are many things you can do to reduce pain on your own. Consider joining an online or in-person support group for people who have chronic pain. Keep all follow-up visits. This is important. Where to find more information You can find more information about managing pain without opioids from: American Academy of Pain Medicine: painmed.[place] for Chronic Pain: instituteforchronicpain.org American Chronic Pain Association: theacpa.org Contact a health care provider if: You have side  effects from pain medicine. Your pain gets worse or does not get better with treatments or home **Note De-Identified Garlock Obfuscation** therapy. You are struggling with anxiety or depression. Summary Many types of pain can be managed without opioids. Chronic pain may respond better to pain management without opioids. Pain is best managed when you and a team of health care providers work together. Pain management without opioids may include non-opioid medicines, medical treatments, physical therapy, mental health therapy, and lifestyle changes. Tell your health care providers if your pain gets worse or is not being managed well enough. This information is not intended to replace advice given to you by your health care provider. Make sure you discuss any questions you have with your health care provider. Document Revised: 06/17/2020 Document Reviewed: 06/17/2020 Elsevier Patient Education  [place].

## 2021-09-24 NOTE — Progress Notes (Signed)
**Note De-Identified Schmuhl Obfuscation** Subjective:   Daniel Brock is a 76 y.o. male who presents for an Initial Medicare Annual Wellness Visit.  Virtual Visit [facility] Telephone Note  I connected with  Daniel Brock on 09/24/21 at 12:00 PM EDT by telephone and verified that I am speaking with the correct person using two identifiers.  Location: Patient: Home Provider: [facility] Persons participating in the virtual visit: patient/Nurse Health Advisor   I discussed the limitations, risks, security and privacy concerns of performing an evaluation and management service by telephone and the availability of in person appointments. The patient expressed understanding and agreed to proceed.  Interactive audio and video telecommunications were attempted between this nurse and patient, however failed, due to patient having technical difficulties OR patient did not have access to video capability.  We continued and completed visit with audio only.  Some vital signs may be absent or patient reported.   Maizey Menendez E Terasa Orsini, LPN   Review of Systems     Cardiac Risk Factors include: advanced age (>19mn, >>88women);dyslipidemia;hypertension;male gender;obesity (BMI >30kg/m2);sedentary lifestyle     Objective:    Today's Vitals   09/24/21 1204  Weight: 245 lb (111.1 kg)  PainSc: 8    Body mass index is 34.17 kg/m.     09/24/2021   12:20 PM 10/11/2017    1:25 PM 01/04/2017    9:01 AM 12/21/2016    1:07 PM 11/18/2015    1:18 PM  Advanced Directives  Does Patient Have a Medical Advance Directive? No No No No No  Would patient like information on creating a medical advance directive? No - Patient declined        Current Medications (verified) Outpatient Encounter Medications as of 09/24/2021  Medication Sig   Cholecalciferol (VITAMIN D3 PO) Take 2,000 Units by mouth daily.   citalopram (CELEXA) 40 MG tablet Take 1 tablet (40 mg total) by mouth daily.   cyclobenzaprine (FLEXERIL) 10 MG tablet Take 10 mg by mouth daily as needed.    ezetimibe (ZETIA) 10 MG tablet Take 1 tablet (10 mg total) by mouth daily.   HYDROcodone-acetaminophen (NORCO/VICODIN) 5-325 MG tablet Take 1 tablet by mouth every 6 (six) hours as needed for moderate pain.   KRILL OIL PO Take by mouth.   losartan (COZAAR) 25 MG tablet Take 1 tablet (25 mg total) by mouth daily.   metoprolol tartrate (LOPRESSOR) 50 MG tablet Take 1 tablet (50 mg total) by mouth as directed. Take 1 tablet 2 hours before your CT scan   rosuvastatin (CRESTOR) 20 MG tablet Take 1 tablet (20 mg total) by mouth daily.   tamsulosin (FLOMAX) 0.4 MG CAPS capsule Take 0.4 mg by mouth. Take two tablets by mouth daily   colchicine 0.6 MG tablet Take 1 tablet (0.6 mg total) by mouth daily as needed. (Patient not taking: Reported on 09/24/2021)   No facility-administered encounter medications on file as of 09/24/2021.    Allergies (verified) Ciprofloxacin, Ciprofloxacin hcl, Sulfamethoxazole-trimethoprim, Sulfonamide derivatives, and Sulphur [elemental sulfur]   History: Past Medical History:  Diagnosis Date   Allergy    Anxiety    Cataract    Chronic kidney disease    Depression 2016   Gout    Hiatal hernia    Hyperlipidemia    Hypertension    Renal insufficiency    Varicose veins    Past Surgical History:  Procedure Laterality Date   CARPAL TUNNEL RELEASE Left    CARPAL TUNNEL RELEASE Bilateral    CHOLECYSTECTOMY  2026 **Note De-Identified Tomer Obfuscation** COLONOSCOPY     COLONOSCOPY W/ POLYPECTOMY     EYE SURGERY Bilateral 1958, 2004   cataracts   INGUINAL HERNIA REPAIR     JOINT REPLACEMENT  2002   TOTAL KNEE ARTHROPLASTY Right 2002   ulnar intrapment release  07/2008   VARICOSE VEIN SURGERY Left 1980's per patient   Family History  Problem Relation Age of Onset   Hypertension Mother    Hyperlipidemia Mother    Arthritis Mother    Heart disease Mother 31       CABG   Huntington's disease Mother    Cancer Father        pancreatic cancer   Arthritis Father    Diabetes Father    Hypertension  Father    Hyperlipidemia Sister    Hypertension Sister    Hypertension Brother    Vision loss Brother    Huntington's disease Brother    Heart disease Maternal Grandmother    Early death Maternal Grandfather    Colon cancer Neg Hx    Esophageal cancer Neg Hx    Rectal cancer Neg Hx    Stomach cancer Neg Hx    Social History   Socioeconomic History   Marital status: Married    Spouse name: Izora Gala   Number of children: Not on file   Years of education: Not on file   Highest education level: Not on file  Occupational History   Occupation: retired  Tobacco Use   Smoking status: Never   Smokeless tobacco: Never  Vaping Use   Vaping Use: Never used  Substance and Sexual Activity   Alcohol use: No   Drug use: No   Sexual activity: Never  Other Topics Concern   Not on file  Social History Narrative   Not on file   Social Determinants of Health   Financial Resource Strain: Low Risk  (09/24/2021)   Overall Financial Resource Strain (CARDIA)    Difficulty of Paying Living Expenses: Not hard at all  Food Insecurity: No Food Insecurity (09/24/2021)   Hunger Vital Sign    Worried About Running Out of Food in the Last Year: Never true    Ran Out of Food in the Last Year: Never true  Transportation Needs: No Transportation Needs (09/24/2021)   PRAPARE - Hydrologist (Medical): No    Lack of Transportation (Non-Medical): No  Physical Activity: Inactive (09/24/2021)   Exercise Vital Sign    Days of Exercise per Week: 0 days    Minutes of Exercise per Session: 0 min  Stress: No Stress Concern Present (09/24/2021)   [place]    Feeling of Stress : Not at all  Social Connections: Moderately Isolated (09/24/2021)   Social Connection and Isolation Panel [NHANES]    Frequency of Communication with Friends and Family: More than three times a week    Frequency of Social Gatherings with Friends and  Family: Once a week    Attends Religious Services: Never    Marine scientist or Organizations: No    Attends Music therapist: Never    Marital Status: Married    Tobacco Counseling Counseling given: Not Answered   Clinical Intake:  Pre-visit preparation completed: Yes  Pain : 0-10 Pain Score: 8  Pain Type: Chronic pain Pain Location: Back Pain Orientation: Mid, Lower Pain Descriptors / Indicators: Aching, Sharp, Discomfort Pain Onset: More than a month ago Pain Frequency: Intermittent **Note De-Identified Yepez Obfuscation** BMI - recorded: 34.17 Nutritional Status: BMI > 30  Obese Nutritional Risks: None Diabetes: No  How often do you need to have someone help you when you read instructions, pamphlets, or other written materials from your doctor or pharmacy?: 1 - Never  Diabetic? no  Interpreter Needed?: No  Information entered by :: Emmary Culbreath, LPN   Activities of Daily Living    09/24/2021   12:12 PM  In your present state of health, do you have any difficulty performing the following activities:  Hearing? 0  Vision? 0  Difficulty concentrating or making decisions? 0  Walking or climbing stairs? 0  Dressing or bathing? 0  Doing errands, shopping? 1  Comment macular degeneration - doesn't drive  Preparing Food and eating ? N  Using the Toilet? N  In the past six months, have you accidently leaked urine? Y  Comment urge incontinence  Do you have problems with loss of bowel control? N  Managing your Medications? N  Managing your Finances? N  Housekeeping or managing your Housekeeping? N    Patient Care Team: Dettinger, Fransisca Kaufmann, MD as PCP - General (Family Medicine) Herminio Commons, MD (Inactive) as PCP - Cardiology (Cardiology) Sandie Ano, PA (Orthopedic Surgery) Liana Gerold, MD as Consulting Physician (Nephrology) Zadie Rhine Clent Demark, MD as Consulting Physician (Ophthalmology)  Indicate any recent Medical Services you may have received from other  than [facility] providers in the past year (date may be approximate).     Assessment:   This is a routine wellness examination for Lennon.  Hearing/Vision screen Hearing Screening - Comments:: Denies hearing difficulties   Vision Screening - Comments:: Wears rx glasses - up to date with routine eye exams with [facility]  Dietary issues and exercise activities discussed: Current Exercise Habits: Home exercise routine, Type of exercise: walking, Time (Minutes): 10, Frequency (Times/Week): 7, Weekly Exercise (Minutes/Week): 70, Intensity: Mild, Exercise limited by: orthopedic condition(s)   Goals Addressed             This Visit's Progress    Patient Stated       09/24/2021 - Wants to stay healthy and active, get back feeling better, maybe get more active       Depression Screen    09/24/2021   12:10 PM 09/15/2021   10:57 AM 04/30/2021   10:14 AM 12/09/2020    9:37 AM 10/30/2020   10:47 AM 05/04/2020    1:01 PM 10/03/2019    1:31 PM  PHQ 2/9 Scores  PHQ - 2 Score 0 0 '2 3 3 2 6  '$ PHQ- 9 Score   '5 13 8  10    '$ Fall Risk    09/24/2021   12:05 PM 09/15/2021   10:57 AM 04/30/2021   10:14 AM 12/09/2020    9:37 AM 10/30/2020   10:47 AM  Fall Risk   Falls in the past year? 1 0 0 0 0  Number falls in past yr: 0      Injury with Fall? 0      Risk for fall due to : Impaired balance/gait;Orthopedic patient      Follow up Education provided;Falls prevention discussed        FALL RISK PREVENTION PERTAINING TO THE HOME:  Any stairs in or around the home? Yes  If so, are there any without handrails? No  Home free of loose throw rugs in walkways, pet beds, electrical cords, etc? Yes  Adequate lighting in your home to reduce risk of **Note De-Identified [place] Obfuscation** falls? Yes   ASSISTIVE DEVICES UTILIZED TO PREVENT FALLS:  Life alert? No  Use of a cane, walker or w/c? No  Grab bars in the bathroom? Yes  Shower chair or bench in shower? No  Elevated toilet seat or a handicapped toilet? No   TIMED UP AND GO:  Was the test  performed? No . Telephonic visit  Cognitive Function:        09/24/2021   12:16 PM  6CIT Screen  What Year? 0 points  What month? 0 points  What time? 0 points  Count back from 20 0 points  Months in reverse 0 points  Repeat phrase 2 points  Total Score 2 points    Immunizations Immunization History  Administered Date(s) Administered   Fluad Quad(high Dose 65+) 01/03/2019, 04/30/2021   Hepatitis B 03/21/1980   Influenza, High Dose Seasonal PF 03/22/2017, 02/21/2018   Influenza-Unspecified 01/22/2020   PFIZER(Purple Top)SARS-COV-2 Vaccination 05/19/2019, 06/18/2019, 02/05/2020   Pneumococcal Conjugate-13 03/07/2016   Pneumococcal Polysaccharide-23 02/18/2013, 08/30/2013   Td 02/18/2005, 03/04/2009   Zoster, Live 03/10/2015    TDAP status: Due, Education has been provided regarding the importance of this vaccine. Advised may receive this vaccine at local pharmacy or Health Dept. Aware to provide a copy of the vaccination record if obtained from local pharmacy or Health Dept. Verbalized acceptance and understanding.  Flu Vaccine status: Up to date  Pneumococcal vaccine status: Up to date  Covid-19 vaccine status: Completed vaccines  Qualifies for Shingles Vaccine? Yes   Zostavax completed Yes   Shingrix Completed?: No.    Education has been provided regarding the importance of this vaccine. Patient has been advised to call insurance company to determine out of pocket expense if they have not yet received this vaccine. Advised may also receive vaccine at local pharmacy or Health Dept. Verbalized acceptance and understanding.  Screening Tests Health Maintenance  Topic Date Due   Zoster Vaccines- Shingrix (1 of 2) Never done   COVID-19 Vaccine (4 - Booster for Pfizer series) 04/01/2020   TETANUS/TDAP  10/30/2021 (Originally 03/05/2019)   INFLUENZA VACCINE  10/19/2021   COLONOSCOPY (Pts 45-56yr Insurance coverage will need to be confirmed)  01/04/2022   Pneumonia Vaccine  76 Years old  Completed   Hepatitis C Screening  Completed   HPV VACCINES  Aged Out    Health Maintenance  Health Maintenance Due  Topic Date Due   Zoster Vaccines- Shingrix (1 of 2) Never done   COVID-19 Vaccine (4 - Booster for Pfizer series) 04/01/2020    Colorectal cancer screening: Type of screening: Colonoscopy. Completed 01/04/2017. Repeat every 5 years  Lung Cancer Screening: (Low Dose CT Chest recommended if Age 77-80years, 30 pack-year currently smoking OR have quit w/in 15years.) does not qualify.   Additional Screening:  Hepatitis C Screening: does qualify; Completed 03/07/2016  Vision Screening: Recommended annual ophthalmology exams for early detection of glaucoma and other disorders of the eye. Is the patient up to date with their annual eye exam?  Yes  Who is the provider or what is the name of the office in which the patient attends annual eye exams? [facility] If pt is not established with a provider, would they like to be referred to a provider to establish care? No .   Dental Screening: Recommended annual dental exams for proper oral hygiene  Community Resource Referral / Chronic Care Management: CRR required this visit?  No   CCM required this visit?  No **Note De-Identified [place] Obfuscation** Plan:     I have personally reviewed and noted the following in the patient's chart:   Medical and social history Use of alcohol, tobacco or illicit drugs  Current medications and supplements including opioid prescriptions. Patient is currently taking opioid prescriptions. Information provided to patient regarding non-opioid alternatives. Patient advised to discuss non-opioid treatment plan with their provider. Functional ability and status Nutritional status Physical activity Advanced directives List of other physicians Hospitalizations, surgeries, and ER visits in previous 12 months Vitals Screenings to include cognitive, depression, and falls Referrals and appointments  In addition, I  have reviewed and discussed with patient certain preventive protocols, quality metrics, and best practice recommendations. A written personalized care plan for preventive services as well as general preventive health recommendations were provided to patient.     Sandrea Hammond, LPN   06/23/4582   Nurse Notes: None

## 2021-10-20 ENCOUNTER — Encounter (INDEPENDENT_AMBULATORY_CARE_PROVIDER_SITE_OTHER): Payer: Self-pay | Admitting: Ophthalmology

## 2021-10-20 ENCOUNTER — Ambulatory Visit (INDEPENDENT_AMBULATORY_CARE_PROVIDER_SITE_OTHER): Payer: PPO | Admitting: Ophthalmology

## 2021-10-20 DIAGNOSIS — H353221 Exudative age-related macular degeneration, left eye, with active choroidal neovascularization: Secondary | ICD-10-CM | POA: Diagnosis not present

## 2021-10-20 DIAGNOSIS — H3530 Unspecified macular degeneration: Secondary | ICD-10-CM

## 2021-10-20 DIAGNOSIS — H353123 Nonexudative age-related macular degeneration, left eye, advanced atrophic without subfoveal involvement: Secondary | ICD-10-CM

## 2021-10-20 DIAGNOSIS — H353114 Nonexudative age-related macular degeneration, right eye, advanced atrophic with subfoveal involvement: Secondary | ICD-10-CM

## 2021-10-20 MED ORDER — FARICIMAB-SVOA 6 MG/0.05ML IZ SOLN
6.0000 mg | INTRAVITREAL | Status: AC | PRN
  Administered 2021-12-08: 6 mg via INTRAVITREAL

## 2021-10-20 NOTE — Assessment & Plan Note (Signed)
**Note De-Identified Karrer Obfuscation** OS doing very well at 8-week interval today.  Repeat Avastin today to maintain visual functioning of the history recurrence multiple recurrences with intraretinal fluid and subfoveal disciform scar.  Follow-up in 7 weeks

## 2021-10-20 NOTE — Assessment & Plan Note (Signed)
**Note De-identified [place]on Obfuscation** The nature of wet macular degeneration was discussed with the patient.  Forms of therapy reviewed include the use of Anti-VEGF medications injected painlessly into the eye, as well as other possible treatment modalities, including thermal laser therapy. Fellow eye involvement and risks were discussed with the patient. Upon the finding of wet age related macular degeneration, treatment will be offered. The treatment regimen is on a treat as needed basis with the intent to treat if necessary and extend interval of exams when possible. On average 1 out of 6 patients do not need lifetime therapy. However, the risk of recurrent disease is high for a lifetime.  Initially monthly, then periodic, examinations and evaluations will determine whether the next treatment is required on the day of the examination. 

## 2021-10-20 NOTE — Assessment & Plan Note (Signed)
**Note De-Identified Demelo Obfuscation** Some progression as well

## 2021-10-20 NOTE — Assessment & Plan Note (Signed)
**Note De-Identified Gellerman Obfuscation** Accounts for acuity OD

## 2021-10-20 NOTE — Progress Notes (Addendum)
**Note De-Identified Neumeier Obfuscation** 10/20/2021     CHIEF COMPLAINT Patient presents for  Chief Complaint  Patient presents with   Macular Degeneration      HISTORY OF PRESENT ILLNESS: Daniel Brock is a 76 y.o. male who presents to the clinic today for:     Referring physician: Dettinger, Daniel Kaufmann, MD [place],  [place] [id number]  HISTORICAL INFORMATION:   Selected notes from the [facility]    Lab Results  Component Value Date   HGBA1C 5.6 10/30/2020     CURRENT MEDICATIONS: No current outpatient medications on file. (Ophthalmic Drugs)   No current facility-administered medications for this visit. (Ophthalmic Drugs)   Current Outpatient Medications (Other)  Medication Sig   Cholecalciferol (VITAMIN D3 PO) Take 2,000 Units by mouth daily.   citalopram (CELEXA) 40 MG tablet Take 1 tablet (40 mg total) by mouth daily.   colchicine 0.6 MG tablet Take 1 tablet (0.6 mg total) by mouth daily as needed. (Patient not taking: Reported on 09/24/2021)   cyclobenzaprine (FLEXERIL) 10 MG tablet Take 10 mg by mouth daily as needed.   ezetimibe (ZETIA) 10 MG tablet Take 1 tablet (10 mg total) by mouth daily.   HYDROcodone-acetaminophen (NORCO/VICODIN) 5-325 MG tablet Take 1 tablet by mouth every 6 (six) hours as needed for moderate pain.   KRILL OIL PO Take by mouth.   losartan (COZAAR) 25 MG tablet Take 1 tablet (25 mg total) by mouth daily.   metoprolol tartrate (LOPRESSOR) 50 MG tablet Take 1 tablet (50 mg total) by mouth as directed. Take 1 tablet 2 hours before your CT scan   rosuvastatin (CRESTOR) 20 MG tablet Take 1 tablet (20 mg total) by mouth daily.   tamsulosin (FLOMAX) 0.4 MG CAPS capsule Take 0.4 mg by mouth. Take two tablets by mouth daily   No current facility-administered medications for this visit. (Other)      REVIEW OF SYSTEMS: ROS   Negative for: Constitutional, Gastrointestinal, Neurological, Skin, Genitourinary, Musculoskeletal, HENT, Endocrine, Cardiovascular, Eyes,  Respiratory, Psychiatric, Allergic/Imm, Heme/Lymph Last edited by Daniel Horn, MD on 10/20/2021 10:51 AM.       ALLERGIES Allergies  Allergen Reactions   Ciprofloxacin Nausea And Vomiting   Ciprofloxacin Hcl     REACTION: NAUSIA AND VOMITING   Sulfamethoxazole-Trimethoprim     REACTION: nausea and vomiting   Sulfonamide Derivatives     REACTION: itching   Sulphur [Elemental Sulfur] Rash    PAST MEDICAL HISTORY Past Medical History:  Diagnosis Date   Allergy    Anxiety    Cataract    Chronic kidney disease    Depression 2016   Gout    Hiatal hernia    Hyperlipidemia    Hypertension    Renal insufficiency    Varicose veins    Past Surgical History:  Procedure Laterality Date   CARPAL TUNNEL RELEASE Left    CARPAL TUNNEL RELEASE Bilateral    CHOLECYSTECTOMY  2026   COLONOSCOPY     COLONOSCOPY W/ POLYPECTOMY     EYE SURGERY Bilateral 1958, 2004   cataracts   INGUINAL HERNIA REPAIR     JOINT REPLACEMENT  2002   TOTAL KNEE ARTHROPLASTY Right 2002   ulnar intrapment release  07/2008   VARICOSE VEIN SURGERY Left 1980's per patient    FAMILY HISTORY Family History  Problem Relation Age of Onset   Hypertension Mother    Hyperlipidemia Mother    Arthritis Mother    Heart disease Mother 76 **Note De-Identified Daniel Brock Obfuscation** CABG   Huntington's disease Mother    Cancer Father        pancreatic cancer   Arthritis Father    Diabetes Father    Hypertension Father    Hyperlipidemia Sister    Hypertension Sister    Hypertension Brother    Vision loss Brother    Huntington's disease Brother    Heart disease Maternal Grandmother    Early death Maternal Grandfather    Colon cancer Neg Hx    Esophageal cancer Neg Hx    Rectal cancer Neg Hx    Stomach cancer Neg Hx     SOCIAL HISTORY Social History   Tobacco Use   Smoking status: Never   Smokeless tobacco: Never  Vaping Use   Vaping Use: Never used  Substance Use Topics   Alcohol use: No   Drug use: No         OPHTHALMIC  EXAM:  Base Eye Exam     Visual Acuity (ETDRS)       Right Left   Dist [place] 20/100 20/70         Tonometry (Tonopen, 10:16 AM)       Right Left   Pressure 20 21         Pupils       Pupils APD   Right PERRL None   Left PERRL None         Visual Fields       Left Right    Full Full         Extraocular Movement       Right Left    Full, Ortho Full, Ortho         Neuro/Psych     Oriented x3: Yes   Mood/Affect: Normal         Dilation     Both eyes: 1.0% Mydriacyl, 2.5% Phenylephrine @ 10:16 AM           Slit Lamp and Fundus Exam     External Exam       Right Left   External Normal Normal         Slit Lamp Exam       Right Left   Lids/Lashes Normal Normal   Conjunctiva/Sclera White and quiet White and quiet   Cornea Clear Clear   Anterior Chamber Deep and quiet Deep and quiet   Iris Round and reactive Round and reactive   Lens  Centered posterior chamber intraocular lens   Anterior Vitreous Normal Normal         Fundus Exam       Right Left   Posterior Vitreous  Posterior vitreous detachment   Disc  Peripapillary atrophy,    C/D Ratio  0.55   Macula  Subretinal neovascular membrane pigmented, Subretinal fibrosis, no hemorrhage, Hard drusen, Retinal pigment epithelial atrophy - geographic, myopic type atrophy, no macular thickening   Vessels  Normal   Periphery  Normal            IMAGING AND PROCEDURES  Imaging and Procedures for 10/20/21  OCT, Retina - OU - Both Eyes       Right Eye Quality was borderline. Scan locations included subfoveal. Progression has been stable. Findings include subretinal scarring, central retinal atrophy, outer retinal atrophy.   Left Eye Quality was borderline. Scan locations included subfoveal. Progression has been stable. Findings include choroidal neovascular membrane, cystoid macular edema, epiretinal membrane, intraretinal fluid.   Notes OD with central scarring from dry **Note De-Identified Daniel Brock Obfuscation** ARMD,  myopic macular degeneration, increase atrophy over the last 4 years from prior OCT evaluation  OS with slightly more active CNVM inferonasal and a second lesion superior to the fovea, incidental epiretinal membrane noted left eye, nonfoveal distorting, current currently at 8-week follow-up.  We will repeat injection today examination next in 8 weeks       Intravitreal Injection, Pharmacologic Agent - OS - Left Eye       Time Out 10/20/2021. 10:52 AM. Confirmed correct patient, procedure, site, and patient consented.   Anesthesia Topical anesthesia was used. Anesthetic medications included Lidocaine 4%.   Procedure Preparation included 5% betadine to ocular surface, 10% betadine to eyelids, Tobramycin 0.3%. A 30 gauge needle was used.   Injection: 6 mg faricimab-svoa 6 MG/0.05ML   Route: Intravitreal, Site: Left Eye   NDC: [phone], Waste: 0 mL   Post-op Post injection exam found visual acuity of at least counting fingers. The patient tolerated the procedure well. There were no complications. The patient received written and verbal post procedure care education. Post injection medications included gentamicin.           Listed above for risk of Mab was not delivered.  Indeed it was Avastin.  This will be corrected with current billing as well   ASSESSMENT/PLAN:  Exudative age-related macular degeneration of left eye with active choroidal neovascularization ([place]) OS doing very well at 8-week interval today.  Repeat Avastin today to maintain visual functioning of the history recurrence multiple recurrences with intraretinal fluid and subfoveal disciform scar.  Follow-up in 7 weeks  Myopic macular degeneration of both eyes The nature of wet macular degeneration was discussed with the patient.  Forms of therapy reviewed include the use of Anti-VEGF medications injected painlessly into the eye, as well as other possible treatment modalities, including thermal laser therapy.  Fellow eye involvement and risks were discussed with the patient. Upon the finding of wet age related macular degeneration, treatment will be offered. The treatment regimen is on a treat as needed basis with the intent to treat if necessary and extend interval of exams when possible. On average 1 out of 6 patients do not need lifetime therapy. However, the risk of recurrent disease is high for a lifetime.  Initially monthly, then periodic, examinations and evaluations will determine whether the next treatment is required on the day of the examination.  Advanced nonexudative age-related macular degeneration of right eye with subfoveal involvement Accounts for acuity OD  Advanced nonexudative age-related macular degeneration of left eye without subfoveal involvement Some progression as well     ICD-10-CM   1. Exudative age-related macular degeneration of left eye with active choroidal neovascularization (HCC)  H35.3221 OCT, Retina - OU - Both Eyes    Intravitreal Injection, Pharmacologic Agent - OS - Left Eye    faricimab-svoa (VABYSMO) '6mg'$ /0.67m intravitreal injection    2. Myopic macular degeneration of both eyes  H35.30     3. Advanced nonexudative age-related macular degeneration of right eye with subfoveal involvement  H35.3114     4. Advanced nonexudative age-related macular degeneration of left eye without subfoveal involvement  H35.3123       1.  OS doing well at 8 weeks today post Avastin.  We will repeat injection again because of the history of recurrences monocular status and persistent intraretinal fluid perifoveal around the disciform scar.  2.  OU with extensive RPE atrophy from myopic macular degeneration and aging.  Stable overall  3.  Ophthalmic Meds Ordered this **Note De-Identified [place] Obfuscation** visit:  Meds ordered this encounter  Medications   faricimab-svoa (VABYSMO) '6mg'$ /0.75m intravitreal injection       Return in about 7 weeks (around 12/08/2021) for dilate, OS, AVASTIN OCT.  There are no  Patient Instructions on file for this visit.   Explained the diagnoses, plan, and follow up with the patient and they expressed understanding.  Patient expressed understanding of the importance of proper follow up care.   GClent DemarkRankin M.D. Diseases & Surgery of the Retina and Vitreous Retina & Diabetic ECrystal Lawns08/02/23     Abbreviations: M myopia (nearsighted); A astigmatism; H hyperopia (farsighted); P presbyopia; Mrx spectacle prescription;  CTL contact lenses; OD right eye; OS left eye; OU both eyes  XT exotropia; ET esotropia; PEK punctate epithelial keratitis; PEE punctate epithelial erosions; DES dry eye syndrome; MGD meibomian gland dysfunction; ATs artificial tears; PFAT's preservative free artificial tears; [place]nuclear sclerotic cataract; PSC posterior subcapsular cataract; ERM epi-retinal membrane; PVD posterior vitreous detachment; RD retinal detachment; DM diabetes mellitus; DR diabetic retinopathy; NPDR non-proliferative diabetic retinopathy; PDR proliferative diabetic retinopathy; CSME clinically significant macular edema; DME diabetic macular edema; dbh dot blot hemorrhages; CWS cotton wool spot; POAG primary open angle glaucoma; C/D cup-to-disc ratio; HVF humphrey visual field; GVF goldmann visual field; OCT optical coherence tomography; IOP intraocular pressure; BRVO Branch retinal vein occlusion; CRVO central retinal vein occlusion; CRAO central retinal artery occlusion; BRAO branch retinal artery occlusion; RT retinal tear; SB scleral buckle; PPV pars plana vitrectomy; VH Vitreous hemorrhage; PRP panretinal laser photocoagulation; IVK intravitreal kenalog; VMT vitreomacular traction; MH Macular hole;  NVD neovascularization of the disc; NVE neovascularization elsewhere; AREDS age related eye disease study; ARMD age related macular degeneration; POAG primary open angle glaucoma; EBMD epithelial/anterior basement membrane dystrophy; ACIOL anterior chamber intraocular lens; IOL  intraocular lens; PCIOL posterior chamber intraocular lens; Phaco/IOL phacoemulsification with intraocular lens placement; PCoinjockphotorefractive keratectomy; LASIK laser assisted in situ keratomileusis; HTN hypertension; DM diabetes mellitus; COPD chronic obstructive pulmonary disease

## 2021-10-28 ENCOUNTER — Ambulatory Visit: Payer: PPO | Admitting: Family Medicine

## 2021-11-02 ENCOUNTER — Other Ambulatory Visit: Payer: Self-pay | Admitting: Family Medicine

## 2021-11-04 ENCOUNTER — Ambulatory Visit (INDEPENDENT_AMBULATORY_CARE_PROVIDER_SITE_OTHER): Payer: PPO | Admitting: Family Medicine

## 2021-11-04 ENCOUNTER — Encounter: Payer: Self-pay | Admitting: Family Medicine

## 2021-11-04 VITALS — BP 117/82 | HR 86 | Temp 98.0°F | Ht 71.0 in | Wt 242.0 lb

## 2021-11-04 DIAGNOSIS — E782 Mixed hyperlipidemia: Secondary | ICD-10-CM | POA: Diagnosis not present

## 2021-11-04 DIAGNOSIS — E781 Pure hyperglyceridemia: Secondary | ICD-10-CM

## 2021-11-04 DIAGNOSIS — N1832 Chronic kidney disease, stage 3b: Secondary | ICD-10-CM | POA: Diagnosis not present

## 2021-11-04 DIAGNOSIS — N400 Enlarged prostate without lower urinary tract symptoms: Secondary | ICD-10-CM

## 2021-11-04 DIAGNOSIS — R7303 Prediabetes: Secondary | ICD-10-CM | POA: Diagnosis not present

## 2021-11-04 DIAGNOSIS — I10 Essential (primary) hypertension: Secondary | ICD-10-CM | POA: Diagnosis not present

## 2021-11-04 DIAGNOSIS — M109 Gout, unspecified: Secondary | ICD-10-CM

## 2021-11-04 MED ORDER — TAMSULOSIN HCL 0.4 MG PO CAPS: ORAL_CAPSULE | ORAL | 3 refills | Status: DC

## 2021-11-04 MED ORDER — COLCHICINE 0.6 MG PO TABS: 0.6000 mg | ORAL_TABLET | Freq: Every day | ORAL | 2 refills | Status: DC | PRN

## 2021-11-04 MED ORDER — EZETIMIBE 10 MG PO TABS: 10.0000 mg | ORAL_TABLET | Freq: Every day | ORAL | 3 refills | Status: DC

## 2021-11-04 NOTE — Progress Notes (Signed)
**Note De-Identified Demetriou Obfuscation** BP 117/82   Pulse 86   Temp 98 F (36.7 C)   Ht 5' 11" (1.803 m)   Wt 242 lb (109.8 kg)   SpO2 95%   BMI 33.75 kg/m    Subjective:   Patient ID: Daniel Brock, male    DOB: 19-May-1945, 76 y.o.   MRN: [id number]  HPI: Daniel Brock is a 76 y.o. male presenting on 11/04/2021 for Medical Management of Chronic Issues, Hypertension, and Gout   HPI Hypertension CKD 3 Patient is currently on losartan and metoprolol, and their blood pressure today is 117/82. Patient denies any lightheadedness or dizziness. Patient denies headaches, blurred vision, chest pains, shortness of breath, or weakness. Denies any side effects from medication and is content with current medication.   Hyperlipidemia and hypertriglyceridemia Patient is coming in for recheck of his hyperlipidemia. The patient is currently taking Zetia and Crestor and Krill oil. They deny any issues with myalgias or history of liver damage from it. They deny any focal numbness or weakness or chest pain.   BPH Patient is coming in for recheck on BPH Symptoms: Nocturia and frequency Medication: Flomax Last PSA: More than a year ago  Prediabetes  patient comes in today for recheck of his diabetes. Patient has been currently taking no medication, diet control. Patient is currently on an ACE inhibitor/ARB. Patient has not seen an ophthalmologist this year. Patient denies any issues with their feet. The symptom started onset as an adult CKD and hypertension and hyperlipidemia ARE RELATED TO DM   Relevant past medical, surgical, family and social history reviewed and updated as indicated. Interim medical history since our last visit reviewed. Allergies and medications reviewed and updated.  Review of Systems  Constitutional:  Negative for chills and fever.  Eyes:  Negative for visual disturbance.  Respiratory:  Negative for shortness of breath and wheezing.   Cardiovascular:  Negative for chest pain and leg swelling.   Musculoskeletal:  Negative for back pain and gait problem.  Skin:  Negative for rash.  Neurological:  Negative for dizziness, weakness and light-headedness.  All other systems reviewed and are negative.   Per HPI unless specifically indicated above   Allergies as of 11/04/2021       Reactions   Ciprofloxacin Nausea And Vomiting   Ciprofloxacin Hcl    REACTION: NAUSIA AND VOMITING   Sulfamethoxazole-trimethoprim    REACTION: nausea and vomiting   Sulfonamide Derivatives    REACTION: itching   Sulphur [elemental Sulfur] Rash        Medication List        Accurate as of November 04, 2021  1:17 PM. If you have any questions, ask your nurse or doctor.          citalopram 40 MG tablet Commonly known as: CELEXA Take 1 tablet (40 mg total) by mouth daily.   colchicine 0.6 MG tablet Take 1 tablet (0.6 mg total) by mouth daily as needed.   cyclobenzaprine 10 MG tablet Commonly known as: FLEXERIL Take 10 mg by mouth daily as needed.   ezetimibe 10 MG tablet Commonly known as: ZETIA Take 1 tablet (10 mg total) by mouth daily.   HYDROcodone-acetaminophen 5-325 MG tablet Commonly known as: NORCO/VICODIN Take 1 tablet by mouth every 6 (six) hours as needed for moderate pain.   KRILL OIL PO Take by mouth.   losartan 25 MG tablet Commonly known as: COZAAR Take 1 tablet (25 mg total) by mouth daily.   metoprolol tartrate 50 **Note De-Identified [place] Obfuscation** MG tablet Commonly known as: LOPRESSOR Take 1 tablet (50 mg total) by mouth as directed. Take 1 tablet 2 hours before your CT scan   rosuvastatin 20 MG tablet Commonly known as: CRESTOR Take 1 tablet (20 mg total) by mouth daily.   tamsulosin 0.4 MG Caps capsule Commonly known as: FLOMAX Take 2 capsules (0.8 mg total) by mouth daily.   VITAMIN D3 PO Take 2,000 Units by mouth daily.         Objective:   BP 117/82   Pulse 86   Temp 98 F (36.7 C)   Ht 5' 11" (1.803 m)   Wt 242 lb (109.8 kg)   SpO2 95%   BMI 33.75 kg/m   Wt  Readings from Last 3 Encounters:  11/04/21 242 lb (109.8 kg)  09/24/21 245 lb (111.1 kg)  09/15/21 245 lb (111.1 kg)    Physical Exam Vitals and nursing note reviewed.  Constitutional:      General: He is not in acute distress.    Appearance: He is well-developed. He is not diaphoretic.  Eyes:     General: No scleral icterus.    Conjunctiva/sclera: Conjunctivae normal.  Neck:     Thyroid: No thyromegaly.  Cardiovascular:     Rate and Rhythm: Normal rate and regular rhythm.     Heart sounds: Normal heart sounds. No murmur heard. Pulmonary:     Effort: Pulmonary effort is normal. No respiratory distress.     Breath sounds: Normal breath sounds. No wheezing.  Musculoskeletal:        General: Normal range of motion.     Cervical back: Neck supple.  Lymphadenopathy:     Cervical: No cervical adenopathy.  Skin:    General: Skin is warm and dry.     Findings: No rash.  Neurological:     Mental Status: He is alert and oriented to person, place, and time.     Coordination: Coordination normal.  Psychiatric:        Behavior: Behavior normal.       Assessment & Plan:   Problem List Items Addressed This Visit       Cardiovascular and Mediastinum   Essential hypertension   Relevant Medications   ezetimibe (ZETIA) 10 MG tablet   Other Relevant Orders   CMP14+EGFR   VITAMIN D 25 Hydroxy (Vit-D Deficiency, Fractures)     Genitourinary   BPH (benign prostatic hyperplasia)   Relevant Medications   tamsulosin (FLOMAX) 0.4 MG CAPS capsule   Other Relevant Orders   VITAMIN D 25 Hydroxy (Vit-D Deficiency, Fractures)   PSA, total and free   CKD (chronic kidney disease), stage III (HCC)   Relevant Orders   VITAMIN D 25 Hydroxy (Vit-D Deficiency, Fractures)     Other   HLD (hyperlipidemia) - Primary   Relevant Medications   ezetimibe (ZETIA) 10 MG tablet   Other Relevant Orders   CBC with Differential/Platelet   CMP14+EGFR   Lipid panel   VITAMIN D 25 Hydroxy (Vit-D  Deficiency, Fractures)   Gout   Relevant Medications   colchicine 0.6 MG tablet   Other Relevant Orders   VITAMIN D 25 Hydroxy (Vit-D Deficiency, Fractures)   Pre-diabetes   Relevant Orders   VITAMIN D 25 Hydroxy (Vit-D Deficiency, Fractures)   Bayer DCA Hb A1c Waived   Hypertriglyceridemia   Relevant Medications   ezetimibe (ZETIA) 10 MG tablet   Other Relevant Orders   VITAMIN D 25 Hydroxy (Vit-D Deficiency, Fractures)    Continue **Note De-Identified Wieber Obfuscation** current medication, no changes.  Discussed going back to urology and talking about nocturia and BPH but he declined.  Discussed possibly adding Wellbutrin for motivation and depression but he declined today as well.  He says his energy is down but he denies sleep apnea symptoms.  He thinks it is because he urinates multiple times at night and is tired Follow up plan: Return in about 6 months (around 05/07/2022), or if symptoms worsen or fail to improve, for Hypertension and hyperlipidemia.  Counseling provided for all of the vaccine components Orders Placed This Encounter  Procedures   CBC with Differential/Platelet   CMP14+EGFR   Lipid panel   VITAMIN D 25 Hydroxy (Vit-D Deficiency, Fractures)   Bayer DCA Hb A1c Waived   PSA, total and free    Caryl Pina, MD [facility] 11/04/2021, 1:17 PM

## 2021-11-05 ENCOUNTER — Other Ambulatory Visit: Payer: PPO

## 2021-11-05 DIAGNOSIS — E782 Mixed hyperlipidemia: Secondary | ICD-10-CM | POA: Diagnosis not present

## 2021-11-05 DIAGNOSIS — N400 Enlarged prostate without lower urinary tract symptoms: Secondary | ICD-10-CM | POA: Diagnosis not present

## 2021-11-05 DIAGNOSIS — N1832 Chronic kidney disease, stage 3b: Secondary | ICD-10-CM | POA: Diagnosis not present

## 2021-11-05 DIAGNOSIS — E781 Pure hyperglyceridemia: Secondary | ICD-10-CM | POA: Diagnosis not present

## 2021-11-05 DIAGNOSIS — R7303 Prediabetes: Secondary | ICD-10-CM | POA: Diagnosis not present

## 2021-11-05 DIAGNOSIS — I10 Essential (primary) hypertension: Secondary | ICD-10-CM

## 2021-11-05 DIAGNOSIS — M109 Gout, unspecified: Secondary | ICD-10-CM

## 2021-11-05 LAB — BAYER DCA HB A1C WAIVED: HB A1C (BAYER DCA - WAIVED): 5.6 % (ref 4.8–5.6)

## 2021-11-06 LAB — LIPID PANEL
Chol/HDL Ratio: 3 ratio (ref 0.0–5.0)
Cholesterol, Total: 118 mg/dL (ref 100–199)
HDL: 40 mg/dL (ref >39)
LDL Chol Calc (NIH): 53 mg/dL (ref 0–99)
Triglycerides: 148 mg/dL (ref 0–149)
VLDL Cholesterol Cal: 25 mg/dL (ref 5–40)

## 2021-11-06 LAB — CMP14+EGFR
ALT: 18 IU/L (ref 0–44)
AST: 13 IU/L (ref 0–40)
Albumin/Globulin Ratio: 2.3 — ABNORMAL HIGH (ref 1.2–2.2)
Albumin: 4.3 g/dL (ref 3.8–4.8)
Alkaline Phosphatase: 97 IU/L (ref 44–121)
BUN/Creatinine Ratio: 16 (ref 10–24)
BUN: 21 mg/dL (ref 8–27)
Bilirubin Total: 0.4 mg/dL (ref 0.0–1.2)
CO2: 24 mmol/L (ref 20–29)
Calcium: 8.8 mg/dL (ref 8.6–10.2)
Chloride: 107 mmol/L — ABNORMAL HIGH (ref 96–106)
Creatinine, Ser: 1.31 mg/dL — ABNORMAL HIGH (ref 0.76–1.27)
Globulin, Total: 1.9 g/dL (ref 1.5–4.5)
Glucose: 112 mg/dL — ABNORMAL HIGH (ref 70–99)
Potassium: 4.4 mmol/L (ref 3.5–5.2)
Sodium: 144 mmol/L (ref 134–144)
Total Protein: 6.2 g/dL (ref 6.0–8.5)
eGFR: 56 mL/min/{1.73_m2} — ABNORMAL LOW (ref >59)

## 2021-11-06 LAB — CBC WITH DIFFERENTIAL/PLATELET
Basophils Absolute: 0 10*3/uL (ref 0.0–0.2)
Basos: 0 %
EOS (ABSOLUTE): 0.1 10*3/uL (ref 0.0–0.4)
Eos: 2 %
Hematocrit: 43.1 % (ref 37.5–51.0)
Hemoglobin: 13.8 g/dL (ref 13.0–17.7)
Immature Grans (Abs): 0 10*3/uL (ref 0.0–0.1)
Immature Granulocytes: 0 %
Lymphocytes Absolute: 1.4 10*3/uL (ref 0.7–3.1)
Lymphs: 27 %
MCH: 27.9 pg (ref 26.6–33.0)
MCHC: 32 g/dL (ref 31.5–35.7)
MCV: 87 fL (ref 79–97)
Monocytes Absolute: 0.4 10*3/uL (ref 0.1–0.9)
Monocytes: 8 %
Neutrophils Absolute: 3.2 10*3/uL (ref 1.4–7.0)
Neutrophils: 63 %
Platelets: 110 10*3/uL — ABNORMAL LOW (ref 150–450)
RBC: 4.94 x10E6/uL (ref 4.14–5.80)
RDW: 14 % (ref 11.6–15.4)
WBC: 5.1 10*3/uL (ref 3.4–10.8)

## 2021-11-06 LAB — PSA, TOTAL AND FREE
PSA, Free Pct: 43.6 %
PSA, Free: 1.09 ng/mL
Prostate Specific Ag, Serum: 2.5 ng/mL (ref 0.0–4.0)

## 2021-11-06 LAB — VITAMIN D 25 HYDROXY (VIT D DEFICIENCY, FRACTURES): Vit D, 25-Hydroxy: 30.8 ng/mL (ref 30.0–100.0)

## 2021-12-08 ENCOUNTER — Ambulatory Visit (INDEPENDENT_AMBULATORY_CARE_PROVIDER_SITE_OTHER): Payer: PPO | Admitting: Ophthalmology

## 2021-12-08 ENCOUNTER — Encounter (INDEPENDENT_AMBULATORY_CARE_PROVIDER_SITE_OTHER): Payer: Self-pay | Admitting: Ophthalmology

## 2021-12-08 DIAGNOSIS — H353221 Exudative age-related macular degeneration, left eye, with active choroidal neovascularization: Secondary | ICD-10-CM

## 2021-12-08 DIAGNOSIS — H353114 Nonexudative age-related macular degeneration, right eye, advanced atrophic with subfoveal involvement: Secondary | ICD-10-CM | POA: Diagnosis not present

## 2021-12-08 MED ORDER — BEVACIZUMAB CHEMO INJECTION 1.25MG/0.05ML SYRINGE FOR KALEIDOSCOPE
1.2500 mg | INTRAVITREAL | Status: AC | PRN
  Administered 2021-12-08: 1.25 mg via INTRAVITREAL

## 2021-12-08 MED ORDER — BEVACIZUMAB CHEMO INJECTION 1.25MG/0.05ML SYRINGE FOR KALEIDOSCOPE
2.5000 mg | INTRAVITREAL | Status: AC | PRN
  Administered 2021-12-08: 2.5 mg via INTRAVITREAL

## 2021-12-08 NOTE — Assessment & Plan Note (Signed)
**Note De-Identified Pangelinan Obfuscation** No sign of wet AMD OD

## 2021-12-08 NOTE — Assessment & Plan Note (Signed)
**Note De-Identified Harting Obfuscation** OS STABLE TODAY AT 7 WEEK INTERVAL post Avastin.  Patient noted in the he received a bill for Vabysmo,.  And indeed this must of been accidentally checked as a box for delivery on his last visit of 10-20-2021.  Thus I explained the patient that we will we did confirm on the drug log that he was given the history and delivered Avastin as well as my note documenting Avastin. Accidental noted use Vabysmo must have been done

## 2021-12-08 NOTE — Progress Notes (Signed)
**Note De-Identified Ildefonso Obfuscation** 12/08/2021     CHIEF COMPLAINT Patient presents for  Chief Complaint  Patient presents with   Macular Degeneration      HISTORY OF PRESENT ILLNESS: Daniel Brock is a 76 y.o. male who presents to the clinic today for:   HPI   7 weeks for DILATE OS, AVASTIN OCT. Pt stated no changes in vision. Pt has allergy to CIPRO.  Last edited by Silvestre Moment on 12/08/2021 10:05 AM.      Referring physician: Dettinger, Fransisca Kaufmann, MD [place],  [place] [id number]  HISTORICAL INFORMATION:   Selected notes from the MEDICAL RECORD NUMBER    Lab Results  Component Value Date   HGBA1C 5.6 11/05/2021     CURRENT MEDICATIONS: No current outpatient medications on file. (Ophthalmic Drugs)   No current facility-administered medications for this visit. (Ophthalmic Drugs)   Current Outpatient Medications (Other)  Medication Sig   Cholecalciferol (VITAMIN D3 PO) Take 2,000 Units by mouth daily.   citalopram (CELEXA) 40 MG tablet Take 1 tablet (40 mg total) by mouth daily.   colchicine 0.6 MG tablet Take 1 tablet (0.6 mg total) by mouth daily as needed.   cyclobenzaprine (FLEXERIL) 10 MG tablet Take 10 mg by mouth daily as needed.   ezetimibe (ZETIA) 10 MG tablet Take 1 tablet (10 mg total) by mouth daily.   HYDROcodone-acetaminophen (NORCO/VICODIN) 5-325 MG tablet Take 1 tablet by mouth every 6 (six) hours as needed for moderate pain.   KRILL OIL PO Take by mouth.   losartan (COZAAR) 25 MG tablet Take 1 tablet (25 mg total) by mouth daily.   metoprolol tartrate (LOPRESSOR) 50 MG tablet Take 1 tablet (50 mg total) by mouth as directed. Take 1 tablet 2 hours before your CT scan   rosuvastatin (CRESTOR) 20 MG tablet Take 1 tablet (20 mg total) by mouth daily.   tamsulosin (FLOMAX) 0.4 MG CAPS capsule Take 2 capsules (0.8 mg total) by mouth daily.   No current facility-administered medications for this visit. (Other)      REVIEW OF SYSTEMS: ROS   Negative for: Constitutional,  Gastrointestinal, Neurological, Skin, Genitourinary, Musculoskeletal, HENT, Endocrine, Cardiovascular, Eyes, Respiratory, Psychiatric, Allergic/Imm, Heme/Lymph Last edited by Silvestre Moment on 12/08/2021 10:05 AM.       ALLERGIES Allergies  Allergen Reactions   Ciprofloxacin Nausea And Vomiting   Ciprofloxacin Hcl     REACTION: NAUSIA AND VOMITING   Sulfamethoxazole-Trimethoprim     REACTION: nausea and vomiting   Sulfonamide Derivatives     REACTION: itching   Sulphur [Elemental Sulfur] Rash    PAST MEDICAL HISTORY Past Medical History:  Diagnosis Date   Allergy    Anxiety    Cataract    Chronic kidney disease    Depression 2016   Gout    Hiatal hernia    Hyperlipidemia    Hypertension    Renal insufficiency    Varicose veins    Past Surgical History:  Procedure Laterality Date   CARPAL TUNNEL RELEASE Left    CARPAL TUNNEL RELEASE Bilateral    CHOLECYSTECTOMY  2026   COLONOSCOPY     COLONOSCOPY W/ POLYPECTOMY     EYE SURGERY Bilateral 1958, 2004   cataracts   INGUINAL HERNIA REPAIR     JOINT REPLACEMENT  2002   TOTAL KNEE ARTHROPLASTY Right 2002   ulnar intrapment release  07/2008   VARICOSE VEIN SURGERY Left 1980's per patient    FAMILY HISTORY Family History  Problem Relation **Note De-Identified Naim Obfuscation** Age of Onset   Hypertension Mother    Hyperlipidemia Mother    Arthritis Mother    Heart disease Mother 77       CABG   Huntington's disease Mother    Cancer Father        pancreatic cancer   Arthritis Father    Diabetes Father    Hypertension Father    Hyperlipidemia Sister    Hypertension Sister    Hypertension Brother    Vision loss Brother    Huntington's disease Brother    Heart disease Maternal Grandmother    Early death Maternal Grandfather    Colon cancer Neg Hx    Esophageal cancer Neg Hx    Rectal cancer Neg Hx    Stomach cancer Neg Hx     SOCIAL HISTORY Social History   Tobacco Use   Smoking status: Never   Smokeless tobacco: Never  Vaping Use   Vaping  Use: Never used  Substance Use Topics   Alcohol use: No   Drug use: No         OPHTHALMIC EXAM:  Base Eye Exam     Visual Acuity (ETDRS)       Right Left   Dist Colbert 20/80 -2    Dist ph Raymond NI          Tonometry (Tonopen, 10:15 AM)       Right Left   Pressure 15 17         Pupils       Pupils APD   Right PERRL None   Left PERRL None         Visual Fields       Left Right   Restrictions Partial inner superior temporal, inferior temporal, superior nasal, inferior nasal deficiencies Partial inner superior temporal, inferior temporal, superior nasal, inferior nasal deficiencies         Extraocular Movement       Right Left    Full, Ortho Full, Ortho         Neuro/Psych     Oriented x3: Yes   Mood/Affect: Normal         Dilation     Left eye: 2.5% Phenylephrine, 1.0% Mydriacyl @ 10:15 AM           Slit Lamp and Fundus Exam     External Exam       Right Left   External Normal Normal         Slit Lamp Exam       Right Left   Lids/Lashes Normal Normal   Conjunctiva/Sclera White and quiet White and quiet   Cornea Clear Clear   Anterior Chamber Deep and quiet Deep and quiet   Iris Round and reactive Round and reactive   Lens  Centered posterior chamber intraocular lens   Anterior Vitreous Normal Normal         Fundus Exam       Right Left   Posterior Vitreous  Posterior vitreous detachment   Disc  Peripapillary atrophy,    C/D Ratio  0.55   Macula  Subretinal neovascular membrane pigmented, Subretinal fibrosis, no hemorrhage, Hard drusen, Retinal pigment epithelial atrophy - geographic, myopic type atrophy, no macular thickening   Vessels  Normal   Periphery  Normal            IMAGING AND PROCEDURES  Imaging and Procedures for 12/08/21  OCT, Retina - OU - Both Eyes       Right **Note De-Identified Kostick Obfuscation** Eye Quality was borderline. Scan locations included subfoveal. Progression has been stable. Findings include subretinal scarring, central  retinal atrophy, outer retinal atrophy.   Left Eye Quality was borderline. Scan locations included subfoveal. Central Foveal Thickness: 389. Progression has been stable. Findings include choroidal neovascular membrane, cystoid macular edema, epiretinal membrane, intraretinal fluid.   Notes OD with central scarring from dry ARMD, myopic macular degeneration, increase atrophy over the last 4 years from prior OCT evaluation  OS with slightly more active CNVM inferonasal and a second lesion superior to the fovea, incidental epiretinal membrane noted left eye, nonfoveal distorting, current currently at 7-week follow-up.  We will repeat injection today examination next in 7 weeks       Intravitreal Injection, Pharmacologic Agent - OS - Left Eye       Time Out 12/08/2021. 11:22 AM. Confirmed correct patient, procedure, site, and patient consented.   Anesthesia Topical anesthesia was used. Anesthetic medications included Lidocaine 4%.   Procedure Preparation included 5% betadine to ocular surface, 10% betadine to eyelids, Tobramycin 0.3%. A 30 gauge needle was used.   Injection: 2.5 mg Bevacizumab 1.'25mg'$ /0.3m   Route: Intravitreal, Site: Left Eye   NDC: [id number] Lot: [id number]  Post-op Post injection exam found visual acuity of at least counting fingers. The patient tolerated the procedure well. There were no complications. The patient received written and verbal post procedure care education. Post injection medications included gentamicin.              ASSESSMENT/PLAN:  Exudative age-related macular degeneration of left eye with active choroidal neovascularization (HCC) OS STABLE TODAY AT 7 WEEK INTERVAL post Avastin.  Patient noted in the he received a bill for Vabysmo,.  And indeed this must of been accidentally checked as a box for delivery on his last visit of 10-20-2021.  Thus I explained the patient that we will we did confirm on the drug log that he was given the  history and delivered Avastin as well as my note documenting Avastin. Accidental noted use Vabysmo must have been done    Advanced nonexudative age-related macular degeneration of right eye with subfoveal involvement No sign of wet AMD OD     ICD-10-CM   1. Exudative age-related macular degeneration of left eye with active choroidal neovascularization (HCC)  H35.3221 OCT, Retina - OU - Both Eyes    Intravitreal Injection, Pharmacologic Agent - OS - Left Eye    Bevacizumab (AVASTIN) SOLN 2.5 mg    2. Advanced nonexudative age-related macular degeneration of right eye with subfoveal involvement  H35.3114       1.  Follow-up we will repeat injection intravitreal Avastin OS today to maintain good acuity and stabilize Situation.  2.  The patient that I will audit and and change the last visit of 8-260 that shows a reflex he received Avastin.  I explained notify billing so that corrected billing statement can be developed for creatinine Vabysmo intraocular mass no gross visit  3.  Ophthalmic Meds Ordered this visit:  Meds ordered this encounter  Medications   Bevacizumab (AVASTIN) SOLN 2.5 mg       Return in about 8 weeks (around 02/02/2022) for dilate, OS, AVASTIN OCT.  There are no Patient Instructions on file for this visit.   Explained the diagnoses, plan, and follow up with the patient and they expressed understanding.  Patient expressed understanding of the importance of proper follow up care.   GClent DemarkRankin M.D. Diseases & Surgery of the Retina and **Note De-Identified Vigo Obfuscation** Vitreous Retina & Diabetic Lucas 12/08/21     Abbreviations: M myopia (nearsighted); A astigmatism; H hyperopia (farsighted); P presbyopia; Mrx spectacle prescription;  CTL contact lenses; OD right eye; OS left eye; OU both eyes  XT exotropia; ET esotropia; PEK punctate epithelial keratitis; PEE punctate epithelial erosions; DES dry eye syndrome; MGD meibomian gland dysfunction; ATs artificial tears; PFAT's  preservative free artificial tears; Vidor nuclear sclerotic cataract; PSC posterior subcapsular cataract; ERM epi-retinal membrane; PVD posterior vitreous detachment; RD retinal detachment; DM diabetes mellitus; DR diabetic retinopathy; NPDR non-proliferative diabetic retinopathy; PDR proliferative diabetic retinopathy; CSME clinically significant macular edema; DME diabetic macular edema; dbh dot blot hemorrhages; CWS cotton wool spot; POAG primary open angle glaucoma; C/D cup-to-disc ratio; HVF humphrey visual field; GVF goldmann visual field; OCT optical coherence tomography; IOP intraocular pressure; BRVO Branch retinal vein occlusion; CRVO central retinal vein occlusion; CRAO central retinal artery occlusion; BRAO branch retinal artery occlusion; RT retinal tear; SB scleral buckle; PPV pars plana vitrectomy; VH Vitreous hemorrhage; PRP panretinal laser photocoagulation; IVK intravitreal kenalog; VMT vitreomacular traction; MH Macular hole;  NVD neovascularization of the disc; NVE neovascularization elsewhere; AREDS age related eye disease study; ARMD age related macular degeneration; POAG primary open angle glaucoma; EBMD epithelial/anterior basement membrane dystrophy; ACIOL anterior chamber intraocular lens; IOL intraocular lens; PCIOL posterior chamber intraocular lens; Phaco/IOL phacoemulsification with intraocular lens placement; Mitchell photorefractive keratectomy; LASIK laser assisted in situ keratomileusis; HTN hypertension; DM diabetes mellitus; COPD chronic obstructive pulmonary disease

## 2022-01-17 DIAGNOSIS — M47896 Other spondylosis, lumbar region: Secondary | ICD-10-CM | POA: Diagnosis not present

## 2022-01-17 DIAGNOSIS — M5459 Other low back pain: Secondary | ICD-10-CM | POA: Diagnosis not present

## 2022-01-17 DIAGNOSIS — M5136 Other intervertebral disc degeneration, lumbar region: Secondary | ICD-10-CM | POA: Diagnosis not present

## 2022-01-21 ENCOUNTER — Encounter: Payer: Self-pay | Admitting: Internal Medicine

## 2022-02-01 DIAGNOSIS — M47816 Spondylosis without myelopathy or radiculopathy, lumbar region: Secondary | ICD-10-CM | POA: Diagnosis not present

## 2022-02-02 ENCOUNTER — Encounter (INDEPENDENT_AMBULATORY_CARE_PROVIDER_SITE_OTHER): Payer: PPO | Admitting: Ophthalmology

## 2022-02-02 DIAGNOSIS — H353114 Nonexudative age-related macular degeneration, right eye, advanced atrophic with subfoveal involvement: Secondary | ICD-10-CM | POA: Diagnosis not present

## 2022-02-02 DIAGNOSIS — H353221 Exudative age-related macular degeneration, left eye, with active choroidal neovascularization: Secondary | ICD-10-CM | POA: Diagnosis not present

## 2022-02-02 DIAGNOSIS — H35371 Puckering of macula, right eye: Secondary | ICD-10-CM | POA: Diagnosis not present

## 2022-02-15 ENCOUNTER — Other Ambulatory Visit: Payer: PPO

## 2022-02-15 DIAGNOSIS — N200 Calculus of kidney: Secondary | ICD-10-CM | POA: Diagnosis not present

## 2022-02-15 DIAGNOSIS — I129 Hypertensive chronic kidney disease with stage 1 through stage 4 chronic kidney disease, or unspecified chronic kidney disease: Secondary | ICD-10-CM | POA: Diagnosis not present

## 2022-02-15 DIAGNOSIS — D696 Thrombocytopenia, unspecified: Secondary | ICD-10-CM | POA: Diagnosis not present

## 2022-02-15 DIAGNOSIS — E559 Vitamin D deficiency, unspecified: Secondary | ICD-10-CM | POA: Diagnosis not present

## 2022-02-22 DIAGNOSIS — M47896 Other spondylosis, lumbar region: Secondary | ICD-10-CM | POA: Diagnosis not present

## 2022-03-16 DIAGNOSIS — D696 Thrombocytopenia, unspecified: Secondary | ICD-10-CM | POA: Diagnosis not present

## 2022-03-16 DIAGNOSIS — N200 Calculus of kidney: Secondary | ICD-10-CM | POA: Diagnosis not present

## 2022-03-16 DIAGNOSIS — E870 Hyperosmolality and hypernatremia: Secondary | ICD-10-CM | POA: Diagnosis not present

## 2022-03-16 DIAGNOSIS — I129 Hypertensive chronic kidney disease with stage 1 through stage 4 chronic kidney disease, or unspecified chronic kidney disease: Secondary | ICD-10-CM | POA: Diagnosis not present

## 2022-03-16 DIAGNOSIS — Z6833 Body mass index (BMI) 33.0-33.9, adult: Secondary | ICD-10-CM | POA: Diagnosis not present

## 2022-03-16 DIAGNOSIS — I9589 Other hypotension: Secondary | ICD-10-CM | POA: Diagnosis not present

## 2022-03-17 DIAGNOSIS — H35371 Puckering of macula, right eye: Secondary | ICD-10-CM | POA: Diagnosis not present

## 2022-03-17 DIAGNOSIS — H353221 Exudative age-related macular degeneration, left eye, with active choroidal neovascularization: Secondary | ICD-10-CM | POA: Diagnosis not present

## 2022-03-17 DIAGNOSIS — H353114 Nonexudative age-related macular degeneration, right eye, advanced atrophic with subfoveal involvement: Secondary | ICD-10-CM | POA: Diagnosis not present

## 2022-04-01 DIAGNOSIS — M47816 Spondylosis without myelopathy or radiculopathy, lumbar region: Secondary | ICD-10-CM | POA: Diagnosis not present

## 2022-04-28 DIAGNOSIS — H353221 Exudative age-related macular degeneration, left eye, with active choroidal neovascularization: Secondary | ICD-10-CM | POA: Diagnosis not present

## 2022-04-28 DIAGNOSIS — H353114 Nonexudative age-related macular degeneration, right eye, advanced atrophic with subfoveal involvement: Secondary | ICD-10-CM | POA: Diagnosis not present

## 2022-04-28 DIAGNOSIS — H35371 Puckering of macula, right eye: Secondary | ICD-10-CM | POA: Diagnosis not present

## 2022-05-09 ENCOUNTER — Encounter: Payer: Self-pay | Admitting: Family Medicine

## 2022-05-09 ENCOUNTER — Ambulatory Visit (INDEPENDENT_AMBULATORY_CARE_PROVIDER_SITE_OTHER): Payer: PPO | Admitting: Family Medicine

## 2022-05-09 VITALS — BP 125/75 | HR 80 | Ht 71.0 in | Wt 243.0 lb

## 2022-05-09 DIAGNOSIS — N1832 Chronic kidney disease, stage 3b: Secondary | ICD-10-CM

## 2022-05-09 DIAGNOSIS — Z23 Encounter for immunization: Secondary | ICD-10-CM | POA: Diagnosis not present

## 2022-05-09 DIAGNOSIS — I129 Hypertensive chronic kidney disease with stage 1 through stage 4 chronic kidney disease, or unspecified chronic kidney disease: Secondary | ICD-10-CM | POA: Diagnosis not present

## 2022-05-09 DIAGNOSIS — I10 Essential (primary) hypertension: Secondary | ICD-10-CM | POA: Diagnosis not present

## 2022-05-09 DIAGNOSIS — E781 Pure hyperglyceridemia: Secondary | ICD-10-CM | POA: Diagnosis not present

## 2022-05-09 DIAGNOSIS — R7303 Prediabetes: Secondary | ICD-10-CM | POA: Diagnosis not present

## 2022-05-09 DIAGNOSIS — E782 Mixed hyperlipidemia: Secondary | ICD-10-CM | POA: Diagnosis not present

## 2022-05-09 DIAGNOSIS — N400 Enlarged prostate without lower urinary tract symptoms: Secondary | ICD-10-CM | POA: Diagnosis not present

## 2022-05-09 LAB — BAYER DCA HB A1C WAIVED: HB A1C (BAYER DCA - WAIVED): 6.3 % — ABNORMAL HIGH (ref 4.8–5.6)

## 2022-05-09 MED ORDER — LOSARTAN POTASSIUM 25 MG PO TABS: 25.0000 mg | ORAL_TABLET | Freq: Every day | ORAL | 3 refills | Status: DC

## 2022-05-09 MED ORDER — CITALOPRAM HYDROBROMIDE 40 MG PO TABS: 40.0000 mg | ORAL_TABLET | Freq: Every day | ORAL | 3 refills | Status: DC

## 2022-05-09 MED ORDER — ROSUVASTATIN CALCIUM 20 MG PO TABS: 20.0000 mg | ORAL_TABLET | Freq: Every day | ORAL | 3 refills | Status: DC

## 2022-05-09 NOTE — Progress Notes (Signed)
**Note De-Identified Mims Obfuscation** BP 125/75   Pulse 80   Ht 5' 11"$  (1.803 m)   Wt 243 lb (110.2 kg)   SpO2 98%   BMI 33.89 kg/m    Subjective:   Patient ID: Daniel Brock, male    DOB: 1946/02/28, 77 y.o.   MRN: [id number]  HPI: Daniel Brock is a 77 y.o. male presenting on 05/09/2022 for Medical Management of Chronic Issues, Hyperlipidemia, and Prediabetes   HPI Prediabetes Patient comes in today for recheck of his diabetes. Patient has been currently taking no medications. Patient is currently on an ACE inhibitor/ARB. Patient has not seen an ophthalmologist this year. Patient denies any issues with their feet. The symptom started onset as an adult CKD 3 and hypertension and hyperlipidemia ARE RELATED TO DM   Hypertension Patient is currently on metoprolol and losartan, and their blood pressure today is 125/75. Patient denies any lightheadedness or dizziness. Patient denies headaches, blurred vision, chest pains, shortness of breath, or weakness. Denies any side effects from medication and is content with current medication.   Hyperlipidemia Patient is coming in for recheck of his hyperlipidemia. The patient is currently taking Crestor and Krill oil and Zetia. They deny any issues with myalgias or history of liver damage from it. They deny any focal numbness or weakness or chest pain.   BPH Patient is coming in for recheck on BPH Symptoms: Urinary frequency and hesitancy Medication: Flomax, has already tried finasteride in the past as well Last PSA: 11/05/2021, 2.5  Relevant past medical, surgical, family and social history reviewed and updated as indicated. Interim medical history since our last visit reviewed. Allergies and medications reviewed and updated.  Review of Systems  Constitutional:  Negative for chills and fever.  Eyes:  Negative for visual disturbance.  Respiratory:  Negative for shortness of breath and wheezing.   Cardiovascular:  Negative for chest pain and leg swelling.  Musculoskeletal:   Negative for back pain and gait problem.  Skin:  Negative for rash.  Neurological:  Negative for dizziness, weakness and numbness.  All other systems reviewed and are negative.   Per HPI unless specifically indicated above   Allergies as of 05/09/2022       Reactions   Ciprofloxacin Nausea And Vomiting   Ciprofloxacin Hcl    REACTION: NAUSIA AND VOMITING   Sulfamethoxazole-trimethoprim    REACTION: nausea and vomiting   Sulfonamide Derivatives    REACTION: itching   Sulphur [elemental Sulfur] Rash        Medication List        Accurate as of May 09, 2022  1:45 PM. If you have any questions, ask your nurse or doctor.          citalopram 40 MG tablet Commonly known as: CELEXA Take 1 tablet (40 mg total) by mouth daily.   colchicine 0.6 MG tablet Take 1 tablet (0.6 mg total) by mouth daily as needed.   cyclobenzaprine 10 MG tablet Commonly known as: FLEXERIL Take 10 mg by mouth daily as needed.   ezetimibe 10 MG tablet Commonly known as: ZETIA Take 1 tablet (10 mg total) by mouth daily.   HYDROcodone-acetaminophen 5-325 MG tablet Commonly known as: NORCO/VICODIN Take 1 tablet by mouth every 6 (six) hours as needed for moderate pain.   KRILL OIL PO Take by mouth.   losartan 25 MG tablet Commonly known as: COZAAR Take 1 tablet (25 mg total) by mouth daily.   metoprolol tartrate 50 MG tablet Commonly known as: LOPRESSOR **Note De-Identified Grivas Obfuscation** Take 1 tablet (50 mg total) by mouth as directed. Take 1 tablet 2 hours before your CT scan   rosuvastatin 20 MG tablet Commonly known as: CRESTOR Take 1 tablet (20 mg total) by mouth daily.   tamsulosin 0.4 MG Caps capsule Commonly known as: FLOMAX Take 2 capsules (0.8 mg total) by mouth daily.   VITAMIN D3 PO Take 2,000 Units by mouth daily.         Objective:   BP 125/75   Pulse 80   Ht 5' 11"$  (1.803 m)   Wt 243 lb (110.2 kg)   SpO2 98%   BMI 33.89 kg/m   Wt Readings from Last 3 Encounters:  05/09/22 243 lb  (110.2 kg)  11/04/21 242 lb (109.8 kg)  09/24/21 245 lb (111.1 kg)    Physical Exam Vitals and nursing note reviewed.  Constitutional:      General: He is not in acute distress.    Appearance: He is well-developed. He is not diaphoretic.  Eyes:     General: No scleral icterus.    Conjunctiva/sclera: Conjunctivae normal.  Neck:     Thyroid: No thyromegaly.  Cardiovascular:     Rate and Rhythm: Normal rate and regular rhythm.     Heart sounds: Normal heart sounds. No murmur heard. Pulmonary:     Effort: Pulmonary effort is normal. No respiratory distress.     Breath sounds: Normal breath sounds. No wheezing.  Musculoskeletal:        General: Normal range of motion.     Cervical back: Neck supple.  Lymphadenopathy:     Cervical: No cervical adenopathy.  Skin:    General: Skin is warm and dry.     Findings: No rash.  Neurological:     Mental Status: He is alert and oriented to person, place, and time.     Coordination: Coordination normal.  Psychiatric:        Behavior: Behavior normal.       Assessment & Plan:   Problem List Items Addressed This Visit       Cardiovascular and Mediastinum   Essential hypertension - Primary   Relevant Medications   losartan (COZAAR) 25 MG tablet   rosuvastatin (CRESTOR) 20 MG tablet   Other Relevant Orders   CBC with Differential/Platelet   CMP14+EGFR   Lipid panel   Bayer DCA Hb A1c Waived     Genitourinary   BPH (benign prostatic hyperplasia)   CKD (chronic kidney disease), stage III (HCC)     Other   HLD (hyperlipidemia)   Relevant Medications   losartan (COZAAR) 25 MG tablet   rosuvastatin (CRESTOR) 20 MG tablet   Other Relevant Orders   CBC with Differential/Platelet   CMP14+EGFR   Lipid panel   Bayer DCA Hb A1c Waived   Pre-diabetes   Relevant Orders   Bayer DCA Hb A1c Waived   Hypertriglyceridemia   Relevant Medications   losartan (COZAAR) 25 MG tablet   rosuvastatin (CRESTOR) 20 MG tablet    Continue  current medication, no changes. Follow up plan: Return in about 6 months (around 11/07/2022), or if symptoms worsen or fail to improve, for Prediabetes hypertension and cholesterol.  Counseling provided for all of the vaccine components Orders Placed This Encounter  Procedures   CBC with Differential/Platelet   CMP14+EGFR   Lipid panel   Bayer DCA Hb A1c Waived    Caryl Pina, MD [facility] 05/09/2022, 1:45 PM

## 2022-05-10 LAB — CMP14+EGFR
ALT: 12 IU/L (ref 0–44)
AST: 12 IU/L (ref 0–40)
Albumin/Globulin Ratio: 2.2 (ref 1.2–2.2)
Albumin: 4.2 g/dL (ref 3.8–4.8)
Alkaline Phosphatase: 97 IU/L (ref 44–121)
BUN/Creatinine Ratio: 15 (ref 10–24)
BUN: 20 mg/dL (ref 8–27)
Bilirubin Total: 0.5 mg/dL (ref 0.0–1.2)
CO2: 24 mmol/L (ref 20–29)
Calcium: 9.3 mg/dL (ref 8.6–10.2)
Chloride: 105 mmol/L (ref 96–106)
Creatinine, Ser: 1.35 mg/dL — ABNORMAL HIGH (ref 0.76–1.27)
Globulin, Total: 1.9 g/dL (ref 1.5–4.5)
Glucose: 118 mg/dL — ABNORMAL HIGH (ref 70–99)
Potassium: 4.2 mmol/L (ref 3.5–5.2)
Sodium: 143 mmol/L (ref 134–144)
Total Protein: 6.1 g/dL (ref 6.0–8.5)
eGFR: 54 mL/min/{1.73_m2} — ABNORMAL LOW (ref >59)

## 2022-05-10 LAB — CBC WITH DIFFERENTIAL/PLATELET
Basophils Absolute: 0 10*3/uL (ref 0.0–0.2)
Basos: 1 %
EOS (ABSOLUTE): 0.1 10*3/uL (ref 0.0–0.4)
Eos: 2 %
Hematocrit: 44.7 % (ref 37.5–51.0)
Hemoglobin: 14.8 g/dL (ref 13.0–17.7)
Immature Grans (Abs): 0 10*3/uL (ref 0.0–0.1)
Immature Granulocytes: 0 %
Lymphocytes Absolute: 1.2 10*3/uL (ref 0.7–3.1)
Lymphs: 28 %
MCH: 27.9 pg (ref 26.6–33.0)
MCHC: 33.1 g/dL (ref 31.5–35.7)
MCV: 84 fL (ref 79–97)
Monocytes Absolute: 0.3 10*3/uL (ref 0.1–0.9)
Monocytes: 6 %
Neutrophils Absolute: 2.6 10*3/uL (ref 1.4–7.0)
Neutrophils: 63 %
Platelets: 116 10*3/uL — ABNORMAL LOW (ref 150–450)
RBC: 5.3 x10E6/uL (ref 4.14–5.80)
RDW: 14.2 % (ref 11.6–15.4)
WBC: 4.1 10*3/uL (ref 3.4–10.8)

## 2022-05-10 LAB — LIPID PANEL
Chol/HDL Ratio: 4.7 ratio (ref 0.0–5.0)
Cholesterol, Total: 175 mg/dL (ref 100–199)
HDL: 37 mg/dL — ABNORMAL LOW (ref >39)
LDL Chol Calc (NIH): 95 mg/dL (ref 0–99)
Triglycerides: 256 mg/dL — ABNORMAL HIGH (ref 0–149)
VLDL Cholesterol Cal: 43 mg/dL — ABNORMAL HIGH (ref 5–40)

## 2022-05-10 NOTE — Addendum Note (Signed)
**Note De-Identified Benincasa Obfuscation** Addended by: Alphonzo Dublin on: 05/10/2022 09:00 AM   Modules accepted: Orders

## 2022-05-18 NOTE — Addendum Note (Signed)
**Note De-Identified P[place] Obfuscation** Addended by: Alphonzo Dublin on: 05/18/2022 11:20 AM   Modules accepted: Orders

## 2022-06-09 DIAGNOSIS — H353221 Exudative age-related macular degeneration, left eye, with active choroidal neovascularization: Secondary | ICD-10-CM | POA: Diagnosis not present

## 2022-06-09 DIAGNOSIS — H4423 Degenerative myopia, bilateral: Secondary | ICD-10-CM | POA: Diagnosis not present

## 2022-06-09 DIAGNOSIS — H35371 Puckering of macula, right eye: Secondary | ICD-10-CM | POA: Diagnosis not present

## 2022-06-09 DIAGNOSIS — H353114 Nonexudative age-related macular degeneration, right eye, advanced atrophic with subfoveal involvement: Secondary | ICD-10-CM | POA: Diagnosis not present

## 2022-07-19 ENCOUNTER — Ambulatory Visit (INDEPENDENT_AMBULATORY_CARE_PROVIDER_SITE_OTHER): Payer: PPO | Admitting: Pharmacist

## 2022-07-19 DIAGNOSIS — E782 Mixed hyperlipidemia: Secondary | ICD-10-CM

## 2022-07-19 NOTE — Progress Notes (Signed)
**Note De-identified Brenneman Obfuscation**    **Note De-Identified Berryman Obfuscation** 07/19/2022 Name: Daniel Brock MRN: [id number] DOB: 1946/02/22   This patient is appearing on the insurance-provided list for being at risk of failing the adherence measure for cholesterol and hypertension (ACEi/ARB) medications this calendar year.   Medication: rosuvastatin  Last fill date: 05/09/2022 for 90 day supply  Medication: losartan Last fill date: 05/09/2022 for 90 day supply   Outreached patient to discuss adherence, any barriers to adherence, and consider a referral to me for medication management support.   Patient reports having plenty of medication on hand. Denies myalgias since start of rosuvastatin.    Kieth Brightly, PharmD, BCACP Clinical Pharmacist, [facility]

## 2022-07-21 DIAGNOSIS — H353114 Nonexudative age-related macular degeneration, right eye, advanced atrophic with subfoveal involvement: Secondary | ICD-10-CM | POA: Diagnosis not present

## 2022-07-21 DIAGNOSIS — H353221 Exudative age-related macular degeneration, left eye, with active choroidal neovascularization: Secondary | ICD-10-CM | POA: Diagnosis not present

## 2022-07-21 DIAGNOSIS — H4423 Degenerative myopia, bilateral: Secondary | ICD-10-CM | POA: Diagnosis not present

## 2022-07-21 DIAGNOSIS — H35371 Puckering of macula, right eye: Secondary | ICD-10-CM | POA: Diagnosis not present

## 2022-08-03 ENCOUNTER — Encounter: Payer: Self-pay | Admitting: Pharmacist

## 2022-08-09 ENCOUNTER — Ambulatory Visit (INDEPENDENT_AMBULATORY_CARE_PROVIDER_SITE_OTHER): Payer: PPO

## 2022-08-09 ENCOUNTER — Encounter: Payer: Self-pay | Admitting: Family Medicine

## 2022-08-09 ENCOUNTER — Ambulatory Visit (INDEPENDENT_AMBULATORY_CARE_PROVIDER_SITE_OTHER): Payer: PPO | Admitting: Family Medicine

## 2022-08-09 VITALS — BP 132/78 | HR 70 | Temp 98.6°F | Ht 71.0 in | Wt 243.0 lb

## 2022-08-09 DIAGNOSIS — M19071 Primary osteoarthritis, right ankle and foot: Secondary | ICD-10-CM | POA: Diagnosis not present

## 2022-08-09 DIAGNOSIS — M79671 Pain in right foot: Secondary | ICD-10-CM | POA: Diagnosis not present

## 2022-08-09 DIAGNOSIS — M2011 Hallux valgus (acquired), right foot: Secondary | ICD-10-CM | POA: Diagnosis not present

## 2022-08-09 MED ORDER — METHYLPREDNISOLONE ACETATE 40 MG/ML IJ SUSP
40.0000 mg | Freq: Once | INTRAMUSCULAR | Status: AC
  Administered 2022-08-09: 40 mg via INTRAMUSCULAR

## 2022-08-09 MED ORDER — PREDNISONE 10 MG (21) PO TBPK: ORAL_TABLET | ORAL | 0 refills | Status: DC

## 2022-08-09 NOTE — Progress Notes (Signed)
**Note De-Identified Daniel Brock** Subjective: CC: Right-sided foot pain PCP: Daniel Brock, Daniel Radon, MD HPI:Daniel Brock is a 77 y.o. male presenting to clinic today for:  1.  Right-sided foot pain Patient reports he started having pain over the area of the fifth MTP on the right.  He reports that he did injure it but did not feel like it was a significant injury.  He did not sustain any ecchymosis but does have some swelling and tenderness over that area.  He has CKD and he is limited to non-NSAID type medications for treatment.  He was taken off of colchicine by his nephrologist due to renal impairment and fear that it would progress his renal impairment.  At last checkup his kidney function was back in CKD 3A and he would like to keep it there if possible.   ROS: Per HPI  Allergies  Allergen Reactions   Ciprofloxacin Nausea And Vomiting   Ciprofloxacin Hcl     REACTION: NAUSIA AND VOMITING   Sulfamethoxazole-Trimethoprim     REACTION: nausea and vomiting   Sulfonamide Derivatives     REACTION: itching   Sulphur [Elemental Sulfur] Rash   Past Medical History:  Diagnosis Date   Allergy    Anxiety    Cataract    Chronic kidney disease    Depression 2016   Gout    Hiatal hernia    Hyperlipidemia    Hypertension    Renal insufficiency    Varicose veins     Current Outpatient Medications:    Cholecalciferol (VITAMIN D3 PO), Take 2,000 Units by mouth daily., Disp: , Rfl:    citalopram (CELEXA) 40 MG tablet, Take 1 tablet (40 mg total) by mouth daily., Disp: 90 tablet, Rfl: 3   colchicine 0.6 MG tablet, Take 1 tablet (0.6 mg total) by mouth daily as needed., Disp: 20 tablet, Rfl: 2   cyclobenzaprine (FLEXERIL) 10 MG tablet, Take 10 mg by mouth daily as needed., Disp: , Rfl:    ezetimibe (ZETIA) 10 MG tablet, Take 1 tablet (10 mg total) by mouth daily., Disp: 90 tablet, Rfl: 3   HYDROcodone-acetaminophen (NORCO/VICODIN) 5-325 MG tablet, Take 1 tablet by mouth every 6 (six) hours as needed for moderate pain.,  Disp: , Rfl:    KRILL OIL PO, Take by mouth., Disp: , Rfl:    losartan (COZAAR) 25 MG tablet, Take 1 tablet (25 mg total) by mouth daily., Disp: 90 tablet, Rfl: 3   metoprolol tartrate (LOPRESSOR) 50 MG tablet, Take 1 tablet (50 mg total) by mouth as directed. Take 1 tablet 2 hours before your CT scan, Disp: 1 tablet, Rfl: 0   rosuvastatin (CRESTOR) 20 MG tablet, Take 1 tablet (20 mg total) by mouth daily., Disp: 90 tablet, Rfl: 3   tamsulosin (FLOMAX) 0.4 MG CAPS capsule, Take 2 capsules (0.8 mg total) by mouth daily., Disp: 180 capsule, Rfl: 3 Social History   Socioeconomic History   Marital status: Married    Spouse name: Harriett Sine   Number of children: Not on file   Years of education: Not on file   Highest education level: Not on file  Occupational History   Occupation: retired  Tobacco Use   Smoking status: Never   Smokeless tobacco: Never  Vaping Use   Vaping Use: Never used  Substance and Sexual Activity   Alcohol use: No   Drug use: No   Sexual activity: Never  Other Topics Concern   Not on file  Social History Narrative   Not on file **Note De-Identified Daniel Brock** Social Determinants of Health   Financial Resource Strain: Low Risk  (09/24/2021)   Overall Financial Resource Strain (CARDIA)    Difficulty of Paying Living Expenses: Not hard at all  Food Insecurity: No Food Insecurity (09/24/2021)   Hunger Vital Sign    Worried About Running Out of Food in the Last Year: Never true    Ran Out of Food in the Last Year: Never true  Transportation Needs: No Transportation Needs (09/24/2021)   PRAPARE - Administrator, Civil Service (Medical): No    Lack of Transportation (Non-Medical): No  Physical Activity: Inactive (09/24/2021)   Exercise Vital Sign    Days of Exercise per Week: 0 days    Minutes of Exercise per Session: 0 min  Stress: No Stress Concern Present (09/24/2021)   [employer] of Occupational Health - Occupational Stress Questionnaire    Feeling of Stress : Not at all  Social  Connections: Moderately Isolated (09/24/2021)   Social Connection and Isolation Panel [NHANES]    Frequency of Communication with Friends and Family: More than three times a week    Frequency of Social Gatherings with Friends and Family: Once a week    Attends Religious Services: Never    Database administrator or Organizations: No    Attends Banker Meetings: Never    Marital Status: Married  Catering manager Violence: Not At Risk (09/24/2021)   Humiliation, Afraid, Rape, and Kick questionnaire    Fear of Current or Ex-Partner: No    Emotionally Abused: No    Physically Abused: No    Sexually Abused: No   Family History  Problem Relation Age of Onset   Hypertension Mother    Hyperlipidemia Mother    Arthritis Mother    Heart disease Mother 73       CABG   Huntington's disease Mother    Cancer Father        pancreatic cancer   Arthritis Father    Diabetes Father    Hypertension Father    Hyperlipidemia Sister    Hypertension Sister    Hypertension Brother    Vision loss Brother    Huntington's disease Brother    Heart disease Maternal Grandmother    Early death Maternal Grandfather    Colon cancer Neg Hx    Esophageal cancer Neg Hx    Rectal cancer Neg Hx    Stomach cancer Neg Hx     Objective: Office vital signs reviewed. BP 132/78   Pulse 70   Temp 98.6 F (37 C)   Ht 5\' 11"  (1.803 m)   Wt 243 lb (110.2 kg)   SpO2 96%   BMI 33.89 kg/m   Physical Examination:  General: Awake, alert, well nourished, No acute distress HEENT:  sclera white, MMM MSK:  Right foot: Ambulating independently with minimally antalgic gait.  He has tenderness palpation over the fifth MTP, particularly on that lateral aspect of the MTP.  There is no gross discoloration or significant soft tissue swelling appreciated.  No warmth.  DG Foot Complete Right  Result Date: 08/09/2022 CLINICAL DATA:  Pain over fifth metatarsophalangeal joint after fall. EXAM: RIGHT FOOT COMPLETE -  3+ VIEW COMPARISON:  None Available. FINDINGS: Mild hallux valgus. Mild great toe metatarsophalangeal joint space narrowing with mild-to-moderate midline great toe metatarsal head subchondral degenerative cystic changes and mild peripheral degenerative osteophytosis. Mild lateral calcaneocuboid peripheral degenerative spurring. Minimal dorsal talonavicular degenerative spurring. Small plantar calcaneal heel spur. No acute fracture **Note De-Identified Spring[place] Brock** or dislocation. IMPRESSION: 1. Mild hallux valgus and mild great toe metatarsophalangeal joint osteoarthritis. 2. No acute fracture. Electronically Signed   By: Neita Garnet M.D.   On: 08/09/2022 17:05    Assessment/ Plan: 77 y.o. male   Right foot pain - Plan: DG Foot Complete Right, methylPREDNISolone acetate (DEPO-MEDROL) injection 40 mg, predniSONE (STERAPRED UNI-PAK 21 TAB) 10 MG (21) TBPK tablet  I look for possible fracture with plain films given reports of tenderness but this was negative.  Arthritis of this area was appreciated.  I do not think that he has gouty flareup as physical exam was not consistent with this.  I am going to treat him with prednisone in efforts to alleviate some of the discomfort but would strongly consider evaluation with podiatry for intra-articular corticosteroid injection if needed going forward.  Topical NSAID may be of benefit as well No orders of the defined types were placed in this encounter.  No orders of the defined types were placed in this encounter.    Raliegh Ip, DO [facility] [phone]

## 2022-08-31 ENCOUNTER — Encounter: Payer: Self-pay | Admitting: Family Medicine

## 2022-08-31 ENCOUNTER — Ambulatory Visit (INDEPENDENT_AMBULATORY_CARE_PROVIDER_SITE_OTHER): Payer: PPO | Admitting: Family Medicine

## 2022-08-31 VITALS — BP 122/79 | HR 87 | Temp 97.6°F | Resp 16 | Ht 71.0 in | Wt 243.0 lb

## 2022-08-31 DIAGNOSIS — M5416 Radiculopathy, lumbar region: Secondary | ICD-10-CM

## 2022-08-31 MED ORDER — BETAMETHASONE SOD PHOS & ACET 6 (3-3) MG/ML IJ SUSP
6.0000 mg | Freq: Once | INTRAMUSCULAR | Status: AC
  Administered 2022-08-31: 6 mg via INTRAMUSCULAR

## 2022-08-31 MED ORDER — TRAMADOL HCL 50 MG PO TABS: 50.0000 mg | ORAL_TABLET | Freq: Four times a day (QID) | ORAL | 0 refills | Status: AC

## 2022-08-31 MED ORDER — TIZANIDINE HCL 4 MG PO TABS: 4.0000 mg | ORAL_TABLET | Freq: Four times a day (QID) | ORAL | 1 refills | Status: DC | PRN

## 2022-08-31 MED ORDER — TRAMADOL HCL 50 MG PO TABS: 50.0000 mg | ORAL_TABLET | Freq: Four times a day (QID) | ORAL | 0 refills | Status: DC

## 2022-08-31 NOTE — Progress Notes (Signed)
**Note De-Identified Kimrey Obfuscation** Subjective:  Patient ID: Daniel Brock, male    DOB: [birth year]  Age: 77 y.o. MRN: [id number]  CC: Back Pain (Radiates into right hip area and down leg)   HPI Daniel Brock presents for chronic back pain. Bent over a week ago and felt a pop in the back that radiated down the right leg with 10/10 severe pain. Took Tylenol and went back to bed. Tylenol didn't help. Pain lasted until yesterday. In bed from onset until then. Hurts to turn and stoop over and pain hit again. 5-6/10. Now feels sore. Has seen Dr. Ethelene Hal for ESI 5 mos ago.      08/31/2022    2:24 PM 08/09/2022    3:30 PM 05/09/2022    1:14 PM  Depression screen PHQ 2/9  Decreased Interest 0 0 2  Down, Depressed, Hopeless 0 0 0  PHQ - 2 Score 0 0 2  Altered sleeping 1 0 0  Tired, decreased energy 1 0 3  Change in appetite 0 0 0  Feeling bad or failure about yourself  0 0 2  Trouble concentrating 1 0 0  Moving slowly or fidgety/restless 0 0 0  Suicidal thoughts 0 0 0  PHQ-9 Score 3 0 7  Difficult doing work/chores Somewhat difficult Not difficult at all Not difficult at all    History Cheemeng has a past medical history of Allergy, Anxiety, Cataract, Chronic kidney disease, Depression (2016), Gout, Hiatal hernia, Hyperlipidemia, Hypertension, Renal insufficiency, and Varicose veins.   He has a past surgical history that includes Inguinal hernia repair; Colonoscopy w/ polypectomy; ulnar intrapment release (07/2008); Eye surgery (Bilateral, 1958, 2004); Cholecystectomy (2026); Joint replacement (2002); Total knee arthroplasty (Right, 2002); Varicose vein surgery (Left, 1980's per patient); Carpal tunnel release (Left); Carpal tunnel release (Bilateral); and Colonoscopy.   His family history includes Arthritis in his father and mother; Cancer in his father; Diabetes in his father; Early death in his maternal grandfather; Heart disease in his maternal grandmother; Heart disease (age of onset: 42) in his mother; Huntington's disease  in his brother and mother; Hyperlipidemia in his mother and sister; Hypertension in his brother, father, mother, and sister; Vision loss in his brother.He reports that he has never smoked. He has never used smokeless tobacco. He reports that he does not drink alcohol and does not use drugs.    ROS Review of Systems  Constitutional:  Negative for fever.  Respiratory:  Negative for shortness of breath.   Cardiovascular:  Negative for chest pain.  Musculoskeletal:  Negative for arthralgias.  Skin:  Negative for rash.    Objective:  BP 122/79   Pulse 87   Temp 97.6 F (36.4 C)   Resp 16   Ht 5\' 11"  (1.803 m)   Wt 243 lb (110.2 kg)   SpO2 96%   BMI 33.89 kg/m   BP Readings from Last 3 Encounters:  08/31/22 122/79  08/09/22 132/78  05/09/22 125/75    Wt Readings from Last 3 Encounters:  08/31/22 243 lb (110.2 kg)  08/09/22 243 lb (110.2 kg)  05/09/22 243 lb (110.2 kg)     Physical Exam Vitals reviewed.  Constitutional:      Appearance: He is well-developed.  HENT:     Head: Normocephalic and atraumatic.     Right Ear: External ear normal.     Left Ear: External ear normal.     Mouth/Throat:     Pharynx: No oropharyngeal exudate or posterior oropharyngeal erythema.  Eyes: **Note De-Identified Dolberry Obfuscation** Pupils: Pupils are equal, round, and reactive to light.  Cardiovascular:     Rate and Rhythm: Normal rate and regular rhythm.     Heart sounds: No murmur heard. Pulmonary:     Effort: No respiratory distress.     Breath sounds: Normal breath sounds.  Musculoskeletal:     Cervical back: Normal range of motion and neck supple.  Neurological:     Mental Status: He is alert and oriented to person, place, and time.       Assessment & Plan:   [name] was seen today for back pain.  Diagnoses and all orders for this visit:  Lumbar radiculopathy, chronic -     betamethasone acetate-betamethasone sodium phosphate (CELESTONE) injection 6 mg  Other orders -     Discontinue: traMADol (ULTRAM)  50 MG tablet; Take 1 tablet (50 mg total) by mouth 4 (four) times daily for 5 days. 1-2 tablets up to 4 times a day as needed for pain -     Discontinue: tiZANidine (ZANAFLEX) 4 MG tablet; Take 1 tablet (4 mg total) by mouth every 6 (six) hours as needed for muscle spasms. -     tiZANidine (ZANAFLEX) 4 MG tablet; Take 1 tablet (4 mg total) by mouth every 6 (six) hours as needed for muscle spasms. -     traMADol (ULTRAM) 50 MG tablet; Take 1 tablet (50 mg total) by mouth 4 (four) times daily for 5 days. 1-2 tablets up to 4 times a day as needed for pain       I have discontinued Braedin P. Hutmacher's cyclobenzaprine and predniSONE. I am also having him maintain his metoprolol tartrate, Cholecalciferol (VITAMIN D3 PO), KRILL OIL PO, HYDROcodone-acetaminophen, colchicine, ezetimibe, tamsulosin, citalopram, losartan, rosuvastatin, tiZANidine, and traMADol. We administered betamethasone acetate-betamethasone sodium phosphate.  Allergies as of 08/31/2022       Reactions   Ciprofloxacin Nausea And Vomiting   Ciprofloxacin Hcl    REACTION: NAUSIA AND VOMITING   Sulfamethoxazole-trimethoprim    REACTION: nausea and vomiting   Sulfonamide Derivatives    REACTION: itching   Sulphur [elemental Sulfur] Rash        Medication List        Accurate as of August 31, 2022 11:59 PM. If you have any questions, ask your nurse or doctor.          STOP taking these medications    cyclobenzaprine 10 MG tablet Commonly known as: FLEXERIL Stopped by: Mechele Claude, MD   predniSONE 10 MG (21) Tbpk tablet Commonly known as: STERAPRED UNI-PAK 21 TAB Stopped by: Mechele Claude, MD       TAKE these medications    citalopram 40 MG tablet Commonly known as: CELEXA Take 1 tablet (40 mg total) by mouth daily.   colchicine 0.6 MG tablet Take 1 tablet (0.6 mg total) by mouth daily as needed.   ezetimibe 10 MG tablet Commonly known as: ZETIA Take 1 tablet (10 mg total) by mouth daily.    HYDROcodone-acetaminophen 5-325 MG tablet Commonly known as: NORCO/VICODIN Take 1 tablet by mouth every 6 (six) hours as needed for moderate pain.   KRILL OIL PO Take by mouth.   losartan 25 MG tablet Commonly known as: COZAAR Take 1 tablet (25 mg total) by mouth daily.   metoprolol tartrate 50 MG tablet Commonly known as: LOPRESSOR Take 1 tablet (50 mg total) by mouth as directed. Take 1 tablet 2 hours before your CT scan   rosuvastatin 20 MG tablet Commonly known as: **Note De-Identified Nibert Obfuscation** CRESTOR Take 1 tablet (20 mg total) by mouth daily.   tamsulosin 0.4 MG Caps capsule Commonly known as: FLOMAX Take 2 capsules (0.8 mg total) by mouth daily.   tiZANidine 4 MG tablet Commonly known as: ZANAFLEX Take 1 tablet (4 mg total) by mouth every 6 (six) hours as needed for muscle spasms. Started by: Mechele Claude, MD   traMADol 50 MG tablet Commonly known as: ULTRAM Take 1 tablet (50 mg total) by mouth 4 (four) times daily for 5 days. 1-2 tablets up to 4 times a day as needed for pain Started by: Mechele Claude, MD   VITAMIN D3 PO Take 2,000 Units by mouth daily.         Follow-up: No follow-ups on file.  Mechele Claude, M.D.

## 2022-09-01 DIAGNOSIS — H353221 Exudative age-related macular degeneration, left eye, with active choroidal neovascularization: Secondary | ICD-10-CM | POA: Diagnosis not present

## 2022-09-01 DIAGNOSIS — H35371 Puckering of macula, right eye: Secondary | ICD-10-CM | POA: Diagnosis not present

## 2022-09-01 DIAGNOSIS — H4423 Degenerative myopia, bilateral: Secondary | ICD-10-CM | POA: Diagnosis not present

## 2022-09-01 DIAGNOSIS — H353114 Nonexudative age-related macular degeneration, right eye, advanced atrophic with subfoveal involvement: Secondary | ICD-10-CM | POA: Diagnosis not present

## 2022-09-07 ENCOUNTER — Other Ambulatory Visit: Payer: PPO

## 2022-09-07 DIAGNOSIS — E870 Hyperosmolality and hypernatremia: Secondary | ICD-10-CM | POA: Diagnosis not present

## 2022-09-07 DIAGNOSIS — Z6833 Body mass index (BMI) 33.0-33.9, adult: Secondary | ICD-10-CM | POA: Diagnosis not present

## 2022-09-07 DIAGNOSIS — I9589 Other hypotension: Secondary | ICD-10-CM | POA: Diagnosis not present

## 2022-09-07 DIAGNOSIS — D696 Thrombocytopenia, unspecified: Secondary | ICD-10-CM | POA: Diagnosis not present

## 2022-09-07 DIAGNOSIS — N200 Calculus of kidney: Secondary | ICD-10-CM | POA: Diagnosis not present

## 2022-09-07 DIAGNOSIS — I129 Hypertensive chronic kidney disease with stage 1 through stage 4 chronic kidney disease, or unspecified chronic kidney disease: Secondary | ICD-10-CM | POA: Diagnosis not present

## 2022-09-15 DIAGNOSIS — R7303 Prediabetes: Secondary | ICD-10-CM | POA: Diagnosis not present

## 2022-09-15 DIAGNOSIS — N1831 Chronic kidney disease, stage 3a: Secondary | ICD-10-CM | POA: Diagnosis not present

## 2022-09-15 DIAGNOSIS — D696 Thrombocytopenia, unspecified: Secondary | ICD-10-CM | POA: Diagnosis not present

## 2022-09-15 DIAGNOSIS — I129 Hypertensive chronic kidney disease with stage 1 through stage 4 chronic kidney disease, or unspecified chronic kidney disease: Secondary | ICD-10-CM | POA: Diagnosis not present

## 2022-09-20 ENCOUNTER — Ambulatory Visit (INDEPENDENT_AMBULATORY_CARE_PROVIDER_SITE_OTHER): Payer: PPO | Admitting: Family Medicine

## 2022-09-20 ENCOUNTER — Encounter: Payer: Self-pay | Admitting: Family Medicine

## 2022-09-20 VITALS — BP 113/76 | HR 59 | Temp 97.7°F | Ht 71.0 in | Wt 239.8 lb

## 2022-09-20 DIAGNOSIS — I10 Essential (primary) hypertension: Secondary | ICD-10-CM

## 2022-09-20 DIAGNOSIS — N1832 Chronic kidney disease, stage 3b: Secondary | ICD-10-CM

## 2022-09-20 DIAGNOSIS — E785 Hyperlipidemia, unspecified: Secondary | ICD-10-CM | POA: Diagnosis not present

## 2022-09-20 DIAGNOSIS — M1A9XX0 Chronic gout, unspecified, without tophus (tophi): Secondary | ICD-10-CM | POA: Diagnosis not present

## 2022-09-20 DIAGNOSIS — R4189 Other symptoms and signs involving cognitive functions and awareness: Secondary | ICD-10-CM

## 2022-09-20 DIAGNOSIS — N400 Enlarged prostate without lower urinary tract symptoms: Secondary | ICD-10-CM | POA: Diagnosis not present

## 2022-09-20 DIAGNOSIS — G629 Polyneuropathy, unspecified: Secondary | ICD-10-CM

## 2022-09-20 DIAGNOSIS — D696 Thrombocytopenia, unspecified: Secondary | ICD-10-CM

## 2022-09-20 DIAGNOSIS — H9319 Tinnitus, unspecified ear: Secondary | ICD-10-CM | POA: Diagnosis not present

## 2022-09-20 DIAGNOSIS — F411 Generalized anxiety disorder: Secondary | ICD-10-CM

## 2022-09-20 DIAGNOSIS — G8929 Other chronic pain: Secondary | ICD-10-CM

## 2022-09-20 DIAGNOSIS — M549 Dorsalgia, unspecified: Secondary | ICD-10-CM

## 2022-09-20 NOTE — Assessment & Plan Note (Signed)
**Note De-Identified Gwinn Obfuscation** We will check labs today to rule out possible metabolic causes.  Check CBC, c-Met, B12, vitamin D, and TSH.  Does have a family history of Alzheimer's and Huntington's disease.  If no obvious reversible causes found on labs will need referral to neurology.

## 2022-09-20 NOTE — Assessment & Plan Note (Signed)
**Note De-identified Fauble Obfuscation** Stable on Celexa 40 mg daily. 

## 2022-09-20 NOTE — Patient Instructions (Signed)
**Note De-Identified Clavin Obfuscation** It was very nice to see you today!  We will check blood work today.  We may need to refer you to see the neurologist depending on results  Please continue to work on diet and exercise.  Return in about 3 months (around 12/21/2022).   Take care, Dr Jimmey Ralph  PLEASE NOTE:  If you had any lab tests, please let us know if you have not heard back within a few days. You may see your results on mychart before we have a chance to review them but we will give you a call once they are reviewed by [place].   If we ordered any referrals today, please let us know if you have not heard from their office within the next week.   If you had any urgent prescriptions sent in today, please check with the pharmacy within an hour of our visit to make sure the prescription was transmitted appropriately.   Please try these tips to maintain a healthy lifestyle:  Eat at least 3 REAL meals and 1-2 snacks per day.  Aim for no more than 5 hours between eating.  If you eat breakfast, please do so within one hour of getting up.   Each meal should contain half fruits/vegetables, one quarter protein, and one quarter carbs (no bigger than a computer mouse)  Cut down on sweet beverages. This includes juice, soda, and sweet tea.   Drink at least 1 glass of water with each meal and aim for at least 8 glasses per day  Exercise at least 150 minutes every week.

## 2022-09-20 NOTE — Assessment & Plan Note (Signed)
**Note De-Identified [name] Obfuscation** History consistent with neuropathy.  Reassuring exam today.  Apparently recently had labs done with nephrology that showed an A1c of 6.5.  Will check for other reversible causes of neuropathy with labs including CBC, c-Met, TSH, and B12.  Daniel Brock does have a history of chronic back pain which could be contributing as well.  Pending on results of labs may have him follow-up with neurology or sports medicine for further evaluation for his lower extremity neuropathy.

## 2022-09-20 NOTE — Assessment & Plan Note (Signed)
**Note De-Identified Whitesel Obfuscation** Patient with a few flares since earlier this year.  Currently takes colchicine as needed.  We did discuss allopurinol however his nephrologist told him to avoid this.  He is aware of proteins to avoid.

## 2022-09-20 NOTE — Assessment & Plan Note (Signed)
**Note De-Identified Mcginty Obfuscation** This is been present for a couple of years.  Per patient this is never been worked up in the past.  We will recheck CBC and peripheral smear today.  May need referral to hematology if this is not stable.

## 2022-09-20 NOTE — Assessment & Plan Note (Signed)
**Note De-Identified Mclean Obfuscation** Follows with pain clinic.  Has a history of degenerative disc disease.  This may be contributing to his above neuropathy.

## 2022-09-20 NOTE — Assessment & Plan Note (Addendum)
**Note De-Identified Warren Obfuscation** Cerumen impaction on exam today.  Unable to remove today due to tortuous EAC.  This may be contributing to tinnitus.   Will refer to ENT

## 2022-09-20 NOTE — Progress Notes (Signed)
**Note De-Identified Heikkila Obfuscation** Daniel Brock is a 77 y.o. male who presents today for an office visit.  He is a new patient.   Assessment/Plan:  New/Acute Problems: Cerumen Impaction Unable to be  removed Tostenson irrigation or manual debridement. Will refer to ENT.   Chronic Problems Addressed Today: Cognitive impairment We will check labs today to rule out possible metabolic causes.  Check CBC, c-Met, B12, vitamin D, and TSH.  Does have a family history of Alzheimer's and Huntington's disease.  If no obvious reversible causes found on labs will need referral to neurology.  Tinnitus Cerumen impaction on exam today.  Unable to remove today due to tortuous EAC.  This may be contributing to tinnitus.   Will refer to ENT  Neuropathy History consistent with neuropathy.  Reassuring exam today.  Apparently recently had labs done with nephrology that showed an A1c of 6.5.  Will check for other reversible causes of neuropathy with labs including CBC, c-Met, TSH, and B12.  He does have a history of chronic back pain which could be contributing as well.  Pending on results of labs may have him follow-up with neurology or sports medicine for further evaluation for his lower extremity neuropathy.  Gout Patient with a few flares since earlier this year.  Currently takes colchicine as needed.  We did discuss allopurinol however his nephrologist told him to avoid this.  He is aware of proteins to avoid.   Essential hypertension On losartan 25 mg daily.  Cutting well.  Blood pressure at goal today.  BPH (benign prostatic hyperplasia) On Flomax 0.8 mg daily.  Still having quite a bit of LUTS.  He is not sure if the Flomax is effective.  He has been on other medications for this in the past but does not remember them.  Will continue current regimen for now however if this continues to be an issue would consider referral back to urology versus trial of Rapaflo.  Generalized anxiety disorder Stable on Celexa 40 mg daily.  CKD (chronic  kidney disease), stage III (HCC) Follows with nephrology.  Will recheck be met today.  Thrombocytopenia (HCC) This is been present for a couple of years.  Per patient this is never been worked up in the past.  We will recheck CBC and peripheral smear today.  May need referral to hematology if this is not stable.   Chronic back pain Follows with pain clinic.  Has a history of degenerative disc disease.  This may be contributing to his above neuropathy.  Dyslipidemia On Crestor 20 mg daily and Zetia 10 mg daily.  Tolerating well.     Subjective:  HPI:  Patient here with his wife today. Here to establish care.  See A/P for status of chronic conditions.  They have a few additional concerns they would like to discuss today as well.  Main concern today is short-term memory loss.  This is going on for multiple years but does seem to be getting worse.  He will frequently forget details about things that just happened.  Also sometimes will repeat himself to his wife because he forgot that he told her.  Does not have any issues with long-term memory.  No recent injuries.  No recent illnesses.  Does have a family history of Alzheimer's disease and Huntington's disease.  He has constant ringing in ears. This has been going on for about a year or so. Located in both ears. No obvious triggering or precipitating events. Worse at night. No hearing issues. He **Note De-Identified Dekker Obfuscation** has had initterment vertigo in the past.   He also been having some "puffiness" in the bottoms of his feet. Was told that he has neuropathy causing this. This has been going on for a year or so. He has never had this worked up in the past. Sensation is there all the time. Nothing makes it worse or better.   ROS: Per HPI, otherwise a complete review of systems was negative.   PMH:  The following were reviewed and entered/updated in epic: Past Medical History:  Diagnosis Date   Allergy    Anxiety    Cataract    Chronic kidney disease     Depression 2016   Gout    Hiatal hernia    Hyperlipidemia    Hypertension    Renal insufficiency    Varicose veins    Patient Active Problem List   Diagnosis Date Noted   Thrombocytopenia (HCC) 09/20/2022   Neuropathy 09/20/2022   Chronic back pain 09/20/2022   Dyslipidemia 09/20/2022   Cognitive impairment 09/20/2022   Tinnitus 09/20/2022   CKD (chronic kidney disease), stage III (HCC) 05/04/2020   Advanced nonexudative age-related macular degeneration of right eye with subfoveal involvement 01/27/2020   Advanced nonexudative age-related macular degeneration of left eye without subfoveal involvement 01/27/2020   Myopic macular degeneration of both eyes 01/27/2020   Exudative age-related macular degeneration of left eye with active choroidal neovascularization (HCC) 01/27/2020   Generalized anxiety disorder 03/07/2016   History of colon polyps 02/17/2010   BPH (benign prostatic hyperplasia) 06/03/2008   Gout 08/03/2007   Essential hypertension 03/02/2007   Past Surgical History:  Procedure Laterality Date   CARPAL TUNNEL RELEASE Left    CARPAL TUNNEL RELEASE Bilateral    CHOLECYSTECTOMY  2026   COLONOSCOPY     COLONOSCOPY W/ POLYPECTOMY     EYE SURGERY Bilateral 1958, 2004   cataracts   INGUINAL HERNIA REPAIR     JOINT REPLACEMENT  2002   TOTAL KNEE ARTHROPLASTY Right 2002   ulnar intrapment release  07/2008   VARICOSE VEIN SURGERY Left 1980's per patient    Family History  Problem Relation Age of Onset   Hypertension Mother    Hyperlipidemia Mother    Arthritis Mother    Heart disease Mother 80       CABG   Huntington's disease Mother    Cancer Father        pancreatic cancer   Arthritis Father    Diabetes Father    Hypertension Father    Hyperlipidemia Sister    Hypertension Sister    Hypertension Brother    Vision loss Brother    Huntington's disease Brother    Heart disease Maternal Grandmother    Early death Maternal Grandfather    Colon cancer Neg  Hx    Esophageal cancer Neg Hx    Rectal cancer Neg Hx    Stomach cancer Neg Hx     Medications- reviewed and updated Current Outpatient Medications  Medication Sig Dispense Refill   chlorhexidine (PERIDEX) 0.12 % solution 2 (two) times daily.     Cholecalciferol (VITAMIN D3 PO) Take 2,000 Units by mouth daily.     citalopram (CELEXA) 40 MG tablet Take 1 tablet (40 mg total) by mouth daily. 90 tablet 3   colchicine 0.6 MG tablet Take 1 tablet (0.6 mg total) by mouth daily as needed. 20 tablet 2   ezetimibe (ZETIA) 10 MG tablet Take 1 tablet (10 mg total) by mouth daily. 90 tablet **Note De-Identified Brumley Obfuscation** 3   HYDROcodone-acetaminophen (NORCO/VICODIN) 5-325 MG tablet Take 1 tablet by mouth every 6 (six) hours as needed for moderate pain.     KRILL OIL PO Take by mouth.     losartan (COZAAR) 25 MG tablet Take 1 tablet (25 mg total) by mouth daily. 90 tablet 3   rosuvastatin (CRESTOR) 20 MG tablet Take 1 tablet (20 mg total) by mouth daily. 90 tablet 3   tamsulosin (FLOMAX) 0.4 MG CAPS capsule Take 2 capsules (0.8 mg total) by mouth daily. 180 capsule 3   tiZANidine (ZANAFLEX) 4 MG tablet Take 1 tablet (4 mg total) by mouth every 6 (six) hours as needed for muscle spasms. 30 tablet 1   No current facility-administered medications for this visit.    Allergies-reviewed and updated Allergies  Allergen Reactions   Ciprofloxacin Nausea And Vomiting   Ciprofloxacin Hcl     REACTION: NAUSIA AND VOMITING   Sulfamethoxazole-Trimethoprim     REACTION: nausea and vomiting   Sulfonamide Derivatives     REACTION: itching   Sulphur [Elemental Sulfur] Rash    Social History   Socioeconomic History   Marital status: Married    Spouse name: Harriett Sine   Number of children: Not on file   Years of education: Not on file   Highest education level: Not on file  Occupational History   Occupation: retired  Tobacco Use   Smoking status: Never   Smokeless tobacco: Never  Vaping Use   Vaping Use: Never used  Substance and  Sexual Activity   Alcohol use: No   Drug use: No   Sexual activity: Never  Other Topics Concern   Not on file  Social History Narrative   Not on file   Social Determinants of Health   Financial Resource Strain: Low Risk  (09/24/2021)   Overall Financial Resource Strain (CARDIA)    Difficulty of Paying Living Expenses: Not hard at all  Food Insecurity: No Food Insecurity (09/24/2021)   Hunger Vital Sign    Worried About Running Out of Food in the Last Year: Never true    Ran Out of Food in the Last Year: Never true  Transportation Needs: No Transportation Needs (09/24/2021)   PRAPARE - Administrator, Civil Service (Medical): No    Lack of Transportation (Non-Medical): No  Physical Activity: Inactive (09/24/2021)   Exercise Vital Sign    Days of Exercise per Week: 0 days    Minutes of Exercise per Session: 0 min  Stress: No Stress Concern Present (09/24/2021)   [employer] of Occupational Health - Occupational Stress Questionnaire    Feeling of Stress : Not at all  Social Connections: Moderately Isolated (09/24/2021)   Social Connection and Isolation Panel [NHANES]    Frequency of Communication with Friends and Family: More than three times a week    Frequency of Social Gatherings with Friends and Family: Once a week    Attends Religious Services: Never    Database administrator or Organizations: No    Attends Engineer, structural: Never    Marital Status: Married           Objective:  Physical Exam: BP 113/76   Pulse (!) 59   Temp 97.7 F (36.5 C) (Temporal)   Ht 5\' 11"  (1.803 m)   Wt 239 lb 12.8 oz (108.8 kg)   SpO2 98%   BMI 33.45 kg/m   Gen: No acute distress, resting comfortably HEENT: Bilateral cerumen impaction noted. CV: Regular **Note De-Identified Poirier Obfuscation** rate and rhythm with no murmurs appreciated Pulm: Normal work of breathing, clear to auscultation bilaterally with no crackles, wheezes, or rhonchi Neuro: Grossly normal, moves all extremities - Feet: No  deformities.  Pulses intact and equal bilaterally.  Sensation to light touch intact.  Strength 5 out of 5 throughout. Psych: Normal affect and thought content  Procedure Note Verbal consent obtained.  Ear lavage was performed however unable to remove cerumen impaction [place]colo this method.  Manual debridement was attempted with curette however this was not successful either.  Time Spent: 65 minutes of total time was spent on the date of the encounter performing the following actions: chart review prior to seeing the patient including recent visits with previous PCP, obtaining history, performing a medically necessary exam, counseling on the treatment plan, placing orders, and documenting in our EHR.        Katina Degree. Jimmey Ralph, MD 09/20/2022 2:29 PM

## 2022-09-20 NOTE — Assessment & Plan Note (Signed)
**Note De-Identified Mendez Obfuscation** Follows with nephrology.  Will recheck be met today.

## 2022-09-20 NOTE — Assessment & Plan Note (Signed)
**Note De-Identified Castrogiovanni Obfuscation** On losartan 25 mg daily.  Cutting well.  Blood pressure at goal today.

## 2022-09-20 NOTE — Assessment & Plan Note (Signed)
**Note De-Identified Schum Obfuscation** On Crestor 20 mg daily and Zetia 10 mg daily.  Tolerating well.

## 2022-09-20 NOTE — Assessment & Plan Note (Signed)
**Note De-Identified Jamerson Obfuscation** On Flomax 0.8 mg daily.  Still having quite a bit of LUTS.  He is not sure if the Flomax is effective.  He has been on other medications for this in the past but does not remember them.  Will continue current regimen for now however if this continues to be an issue would consider referral back to urology versus trial of Rapaflo.

## 2022-09-21 LAB — LIPID PANEL
Cholesterol: 124 mg/dL (ref 0–200)
HDL: 38.4 mg/dL — ABNORMAL LOW (ref >39.00)
LDL Cholesterol: 58 mg/dL (ref 0–99)
NonHDL: 86.09
Total CHOL/HDL Ratio: 3
Triglycerides: 142 mg/dL (ref 0.0–149.0)
VLDL: 28.4 mg/dL (ref 0.0–40.0)

## 2022-09-21 LAB — VITAMIN D 25 HYDROXY (VIT D DEFICIENCY, FRACTURES): VITD: 18.97 ng/mL — ABNORMAL LOW (ref 30.00–100.00)

## 2022-09-21 LAB — COMPREHENSIVE METABOLIC PANEL
ALT: 14 U/L (ref 0–53)
AST: 11 U/L (ref 0–37)
Albumin: 4.2 g/dL (ref 3.5–5.2)
Alkaline Phosphatase: 80 U/L (ref 39–117)
BUN: 28 mg/dL — ABNORMAL HIGH (ref 6–23)
CO2: 30 mEq/L (ref 19–32)
Calcium: 9.1 mg/dL (ref 8.4–10.5)
Chloride: 107 mEq/L (ref 96–112)
Creatinine, Ser: 1.38 mg/dL (ref 0.40–1.50)
GFR: 49.03 mL/min — ABNORMAL LOW (ref >60.00)
Glucose, Bld: 111 mg/dL — ABNORMAL HIGH (ref 70–99)
Potassium: 4.5 mEq/L (ref 3.5–5.1)
Sodium: 144 mEq/L (ref 135–145)
Total Bilirubin: 0.8 mg/dL (ref 0.2–1.2)
Total Protein: 6.2 g/dL (ref 6.0–8.3)

## 2022-09-21 LAB — CBC
HCT: 43.8 % (ref 39.0–52.0)
Hemoglobin: 14.2 g/dL (ref 13.0–17.0)
MCHC: 32.4 g/dL (ref 30.0–36.0)
MCV: 87.2 fl (ref 78.0–100.0)
Platelets: 118 10*3/uL — ABNORMAL LOW (ref 150.0–400.0)
RBC: 5.03 Mil/uL (ref 4.22–5.81)
RDW: 15.2 % (ref 11.5–15.5)
WBC: 4.7 10*3/uL (ref 4.0–10.5)

## 2022-09-21 LAB — PATHOLOGIST SMEAR REVIEW

## 2022-09-21 LAB — TSH: TSH: 2.75 u[IU]/mL (ref 0.35–5.50)

## 2022-09-21 LAB — VITAMIN B12: Vitamin B-12: 209 pg/mL — ABNORMAL LOW (ref 211–911)

## 2022-09-21 LAB — MAGNESIUM: Magnesium: 1.9 mg/dL (ref 1.5–2.5)

## 2022-09-27 NOTE — Progress Notes (Signed)
**Note De-Identified Brodman Obfuscation** His B12 is low.  This could explain some of his symptoms including his neuropathy and cognitive issues.  Recommend we start the B12 protocol here.  We should recheck this in 3 months or so.  Vitamin D is also low.  Probably not contributing much to his symptoms however we do need to work on getting this higher.  Recommend starting 50,000 IUs once weekly.  Please send in prescription.  We should recheck this in 3 months.  Does look like he is mildly dehydrated as well-he should make sure that he is getting plenty of fluids.  We can recheck this in a few months as well.  His platelets are low but stable compared to previous values.  He does have clusters on his smear which explains this.  This is a typical finding and not a cause for concern.  The rest of his labs are all at goal and we can recheck everything else in a year or so.

## 2022-09-28 ENCOUNTER — Other Ambulatory Visit: Payer: Self-pay | Admitting: *Deleted

## 2022-09-28 MED ORDER — VITAMIN D (ERGOCALCIFEROL) 1.25 MG (50000 UNIT) PO CAPS: 50000.0000 [IU] | ORAL_CAPSULE | ORAL | 0 refills | Status: DC

## 2022-10-04 ENCOUNTER — Telehealth: Payer: Self-pay | Admitting: *Deleted

## 2022-10-04 ENCOUNTER — Ambulatory Visit (INDEPENDENT_AMBULATORY_CARE_PROVIDER_SITE_OTHER): Payer: PPO | Admitting: *Deleted

## 2022-10-04 DIAGNOSIS — E538 Deficiency of other specified B group vitamins: Secondary | ICD-10-CM

## 2022-10-04 MED ORDER — CYANOCOBALAMIN 1000 MCG/ML IJ SOLN
1000.0000 ug | Freq: Once | INTRAMUSCULAR | Status: AC
Start: 2022-10-04 — End: 2022-10-04
  Administered 2022-10-04: 1000 ug via INTRAMUSCULAR

## 2022-10-04 NOTE — Progress Notes (Signed)
Patient presents for B12 injection today. Patient received her B12 injection in Right Deltoid. Patient tolerated injection well.  Documentation entered in MAR in [employer].   

## 2022-10-04 NOTE — Telephone Encounter (Signed)
**Note De-Identified Wurtz Obfuscation** Patient requesting Rx B12 inj Stated his wife will be doing inj for him  Ok to send prescription

## 2022-10-05 ENCOUNTER — Other Ambulatory Visit: Payer: Self-pay | Admitting: *Deleted

## 2022-10-05 MED ORDER — CYANOCOBALAMIN 1000 MCG/ML IJ SOLN: 1000.0000 ug | INTRAMUSCULAR | 0 refills | Status: AC

## 2022-10-05 NOTE — Telephone Encounter (Signed)
 Ok with me. Please place any necessary orders. 

## 2022-10-05 NOTE — Telephone Encounter (Signed)
Rx send to pharmacy  

## 2022-10-11 ENCOUNTER — Ambulatory Visit (INDEPENDENT_AMBULATORY_CARE_PROVIDER_SITE_OTHER): Payer: PPO

## 2022-10-11 VITALS — Wt 239.0 lb

## 2022-10-11 DIAGNOSIS — Z0000 Encounter for general adult medical examination without abnormal findings: Secondary | ICD-10-CM

## 2022-10-11 NOTE — Progress Notes (Signed)
**Note De-Identified Slayden Obfuscation** Subjective:   Daniel Brock is a 77 y.o. male who presents for Medicare Annual/Subsequent preventive examination.  Visit Complete: Virtual  I connected with  Jakaleb Payer Brock on 10/11/22 by a audio enabled telemedicine application and verified that I am speaking with the correct person using two identifiers.  Patient Location: Home  Provider Location: Office/Clinic  I discussed the limitations of evaluation and management by telemedicine. The patient expressed understanding and agreed to proceed.   Review of Systems     Cardiac Risk Factors include: advanced age (>1men, >20 women);dyslipidemia;hypertension;male gender;obesity (BMI >30kg/m2)     Objective:    Today's Vitals   10/11/22 1433  Weight: 239 lb (108.4 kg)   Body mass index is 33.33 kg/m.     10/11/2022    2:38 PM 09/24/2021   12:20 PM 10/11/2017    1:25 PM 01/04/2017    9:01 AM 12/21/2016    1:07 PM 11/18/2015    1:18 PM  Advanced Directives  Does Patient Have a Medical Advance Directive? No No No No No No  Would patient like information on creating a medical advance directive? No - Patient declined No - Patient declined        Current Medications (verified) Outpatient Encounter Medications as of 10/11/2022  Medication Sig   chlorhexidine (PERIDEX) 0.12 % solution 2 (two) times daily.   Cholecalciferol (VITAMIN D3 PO) Take 2,000 Units by mouth daily.   citalopram (CELEXA) 40 MG tablet Take 1 tablet (40 mg total) by mouth daily.   colchicine 0.6 MG tablet Take 1 tablet (0.6 mg total) by mouth daily as needed.   cyanocobalamin (VITAMIN B12) 1000 MCG/ML injection Inject 1 mL (1,000 mcg total) into the muscle once a week for 6 doses. For 3 weeks, then once per month for 3 month   ezetimibe (ZETIA) 10 MG tablet Take 1 tablet (10 mg total) by mouth daily.   HYDROcodone-acetaminophen (NORCO/VICODIN) 5-325 MG tablet Take 1 tablet by mouth every 6 (six) hours as needed for moderate pain.   KRILL OIL PO Take by mouth.    losartan (COZAAR) 25 MG tablet Take 1 tablet (25 mg total) by mouth daily.   rosuvastatin (CRESTOR) 20 MG tablet Take 1 tablet (20 mg total) by mouth daily.   tamsulosin (FLOMAX) 0.4 MG CAPS capsule Take 2 capsules (0.8 mg total) by mouth daily.   tiZANidine (ZANAFLEX) 4 MG tablet Take 1 tablet (4 mg total) by mouth every 6 (six) hours as needed for muscle spasms.   Vitamin D, Ergocalciferol, (DRISDOL) 1.25 MG (50000 UNIT) CAPS capsule Take 1 capsule (50,000 Units total) by mouth every 7 (seven) days.   No facility-administered encounter medications on file as of 10/11/2022.    Allergies (verified) Ciprofloxacin, Ciprofloxacin hcl, Sulfamethoxazole-trimethoprim, Sulfonamide derivatives, and Sulphur [elemental sulfur]   History: Past Medical History:  Diagnosis Date   Allergy    Anxiety    Cataract    Chronic kidney disease    Depression 2016   Gout    Hiatal hernia    Hyperlipidemia    Hypertension    Renal insufficiency    Varicose veins    Past Surgical History:  Procedure Laterality Date   CARPAL TUNNEL RELEASE Left    CARPAL TUNNEL RELEASE Bilateral    CHOLECYSTECTOMY  2026   COLONOSCOPY     COLONOSCOPY W/ POLYPECTOMY     EYE SURGERY Bilateral 1958, 2004   cataracts   INGUINAL HERNIA REPAIR     JOINT REPLACEMENT **Note De-Identified Klayman Obfuscation** 2002   TOTAL KNEE ARTHROPLASTY Right 2002   ulnar intrapment release  07/2008   VARICOSE VEIN SURGERY Left 1980's per patient   Family History  Problem Relation Age of Onset   Hypertension Mother    Hyperlipidemia Mother    Arthritis Mother    Heart disease Mother 32       CABG   Huntington's disease Mother    Cancer Father        pancreatic cancer   Arthritis Father    Diabetes Father    Hypertension Father    Hyperlipidemia Sister    Hypertension Sister    Hypertension Brother    Vision loss Brother    Huntington's disease Brother    Heart disease Maternal Grandmother    Early death Maternal Grandfather    Colon cancer Neg Hx     Esophageal cancer Neg Hx    Rectal cancer Neg Hx    Stomach cancer Neg Hx    Social History   Socioeconomic History   Marital status: Married    Spouse name: Harriett Sine   Number of children: Not on file   Years of education: Not on file   Highest education level: Not on file  Occupational History   Occupation: retired  Tobacco Use   Smoking status: Never   Smokeless tobacco: Never  Vaping Use   Vaping status: Never Used  Substance and Sexual Activity   Alcohol use: No   Drug use: No   Sexual activity: Never  Other Topics Concern   Not on file  Social History Narrative   Not on file   Social Determinants of Health   Financial Resource Strain: Low Risk  (09/24/2021)   Overall Financial Resource Strain (CARDIA)    Difficulty of Paying Living Expenses: Not hard at all  Food Insecurity: No Food Insecurity (09/24/2021)   Hunger Vital Sign    Worried About Running Out of Food in the Last Year: Never true    Ran Out of Food in the Last Year: Never true  Transportation Needs: No Transportation Needs (09/24/2021)   PRAPARE - Administrator, Civil Service (Medical): No    Lack of Transportation (Non-Medical): No  Physical Activity: Inactive (10/11/2022)   Exercise Vital Sign    Days of Exercise per Week: 0 days    Minutes of Exercise per Session: 0 min  Stress: No Stress Concern Present (10/11/2022)   [employer] of Occupational Health - Occupational Stress Questionnaire    Feeling of Stress : Not at all  Social Connections: Moderately Isolated (10/11/2022)   Social Connection and Isolation Panel [NHANES]    Frequency of Communication with Friends and Family: More than three times a week    Frequency of Social Gatherings with Friends and Family: Once a week    Attends Religious Services: Never    Database administrator or Organizations: No    Attends Engineer, structural: Never    Marital Status: Married    Tobacco Counseling Counseling given: Not  Answered   Clinical Intake:  Pre-visit preparation completed: Yes  Pain : No/denies pain     Diabetes: No  How often do you need to have someone help you when you read instructions, pamphlets, or other written materials from your doctor or pharmacy?: 1 - Never  Interpreter Needed?: No  Information entered by :: Lanier Ensign, LPN   Activities of Daily Living    10/11/2022    2:35 PM  In your **Note De-Identified Goodnough Obfuscation** present state of health, do you have any difficulty performing the following activities:  Hearing? 1  Comment tinnitus  Vision? 0  Difficulty concentrating or making decisions? 0  Walking or climbing stairs? 0  Dressing or bathing? 0  Doing errands, shopping? 0  Preparing Food and eating ? N  Using the Toilet? N  In the past six months, have you accidently leaked urine? Y  Comment at times  Do you have problems with loss of bowel control? N  Managing your Medications? N  Managing your Finances? N  Housekeeping or managing your Housekeeping? N    Patient Care Team: Ardith Dark, MD as PCP - General (Family Medicine) Laqueta Linden, MD (Inactive) as PCP - Cardiology (Cardiology) Patience Musca, [place] (Orthopedic Surgery) Randa Lynn, MD as Consulting Physician (Nephrology) Luciana Axe Alford Highland, MD as Consulting Physician (Ophthalmology)  Indicate any recent Medical Services you may have received from other than [facility] providers in the past year (date may be approximate).     Assessment:   This is a routine wellness examination for Daniel Brock.  Hearing/Vision screen Hearing Screening - Comments:: Pt has tinnitus  Vision Screening - Comments:: Pt follows up with Dr Fawn Kirk for annual eye exams   Dietary issues and exercise activities discussed:     Goals Addressed             This Visit's Progress    Patient Stated       Stay healthy and watch diet        Depression Screen    10/11/2022    2:37 PM 09/20/2022   12:56 PM 08/31/2022    2:24 PM 08/09/2022     3:30 PM 05/09/2022    1:14 PM 11/04/2021    1:02 PM 09/24/2021   12:10 PM  PHQ 2/9 Scores  PHQ - 2 Score 0 0 0 0 2 0 0  PHQ- 9 Score 0 0 3 0 7      Fall Risk    10/11/2022    2:38 PM 09/20/2022   12:57 PM 08/31/2022    2:22 PM 08/09/2022    3:30 PM 05/09/2022    1:13 PM  Fall Risk   Falls in the past year? 1 0 1 0 0  Number falls in past yr: 1 0 1 0   Comment tripped over cat      Injury with Fall? 0 0 0 0   Risk for fall due to : Impaired balance/gait No Fall Risks  No Fall Risks   Follow up Falls prevention discussed  Falls evaluation completed Education provided     MEDICARE RISK AT HOME:  Medicare Risk at Home - 10/11/22 1439     Any stairs in or around the home? Yes    If so, are there any without handrails? No    Home free of loose throw rugs in walkways, pet beds, electrical cords, etc? Yes    Adequate lighting in your home to reduce risk of falls? Yes    Life alert? No    Use of a cane, walker or w/c? No    Grab bars in the bathroom? Yes    Shower chair or bench in shower? No    Elevated toilet seat or a handicapped toilet? No             TIMED UP AND GO:  Was the test performed?  No    Cognitive Function: **Note De-Identified Polakowski Obfuscation** 10/11/2022    2:39 PM 09/24/2021   12:16 PM  6CIT Screen  What Year? 0 points 0 points  What month? 0 points 0 points  What time? 0 points 0 points  Count back from 20 0 points 0 points  Months in reverse 0 points 0 points  Repeat phrase 0 points 2 points  Total Score 0 points 2 points    Immunizations Immunization History  Administered Date(s) Administered   Fluad Quad(high Dose 65+) 01/03/2019, 04/30/2021, 05/09/2022   Hepatitis B 03/21/1980   Influenza, High Dose Seasonal PF 03/22/2017, 02/21/2018   Influenza-Unspecified 01/22/2020   PFIZER(Purple Top)SARS-COV-2 Vaccination 05/19/2019, 06/18/2019, 02/05/2020   Pneumococcal Conjugate-13 03/07/2016   Pneumococcal Polysaccharide-23 02/18/2013, 08/30/2013   Td 02/18/2005, 03/04/2009    Zoster Recombinant(Shingrix) 05/09/2022   Zoster, Live 03/10/2015    TDAP status: Due, Education has been provided regarding the importance of this vaccine. Advised may receive this vaccine at local pharmacy or Health Dept. Aware to provide a copy of the vaccination record if obtained from local pharmacy or Health Dept. Verbalized acceptance and understanding.  Flu Vaccine status: Up to date  Pneumococcal vaccine status: Up to date  Covid-19 vaccine status: Completed vaccines  Qualifies for Shingles Vaccine? Yes   Zostavax completed No   Shingrix Completed?: No.    Education has been provided regarding the importance of this vaccine. Patient has been advised to call insurance company to determine out of pocket expense if they have not yet received this vaccine. Advised may also receive vaccine at local pharmacy or Health Dept. Verbalized acceptance and understanding.  Screening Tests Health Maintenance  Topic Date Due   DTaP/Tdap/Td (3 - Tdap) 03/05/2019   Zoster Vaccines- Shingrix (2 of 2) 12/21/2022 (Originally 07/04/2022)   Colonoscopy  05/10/2023 (Originally 01/04/2022)   INFLUENZA VACCINE  10/20/2022   Medicare Annual Wellness (AWV)  10/11/2023   Pneumonia Vaccine 39+ Years old  Completed   Hepatitis C Screening  Completed   HPV VACCINES  Aged Out   COVID-19 Vaccine  Discontinued    Health Maintenance  Health Maintenance Due  Topic Date Due   DTaP/Tdap/Td (3 - Tdap) 03/05/2019    Colorectal cancer screening: Type of screening: Colonoscopy. Completed 01/04/17. Repeat every 5 years   Additional Screening:  Hepatitis C Screening:  Completed 03/07/16  Vision Screening: Recommended annual ophthalmology exams for early detection of glaucoma and other disorders of the eye. Is the patient up to date with their annual eye exam?  Yes  Who is the provider or what is the name of the office in which the patient attends annual eye exams? Dr Fawn Kirk  If pt is not established  with a provider, would they like to be referred to a provider to establish care? No .   Dental Screening: Recommended annual dental exams for proper oral hygiene    Community Resource Referral / Chronic Care Management: CRR required this visit?  No   CCM required this visit?  No     Plan:     I have personally reviewed and noted the following in the patient's chart:   Medical and social history Use of alcohol, tobacco or illicit drugs  Current medications and supplements including opioid prescriptions. Patient is currently taking opioid prescriptions. Information provided to patient regarding non-opioid alternatives. Patient advised to discuss non-opioid treatment plan with their provider. Functional ability and status Nutritional status Physical activity Advanced directives List of other physicians Hospitalizations, surgeries, and ER visits in previous 12 months Vitals **Note De-Identified Mcnellis Obfuscation** Screenings to include cognitive, depression, and falls Referrals and appointments  In addition, I have reviewed and discussed with patient certain preventive protocols, quality metrics, and best practice recommendations. A written personalized care plan for preventive services as well as general preventive health recommendations were provided to patient.     Marzella Schlein, LPN   1/61/0960   After Visit Summary: (Declined) Due to this being a telephonic visit, with patients personalized plan was offered to patient but patient Declined AVS at this time   Nurse Notes: none

## 2022-10-26 DIAGNOSIS — H35371 Puckering of macula, right eye: Secondary | ICD-10-CM | POA: Diagnosis not present

## 2022-10-26 DIAGNOSIS — H353221 Exudative age-related macular degeneration, left eye, with active choroidal neovascularization: Secondary | ICD-10-CM | POA: Diagnosis not present

## 2022-10-26 DIAGNOSIS — H353114 Nonexudative age-related macular degeneration, right eye, advanced atrophic with subfoveal involvement: Secondary | ICD-10-CM | POA: Diagnosis not present

## 2022-11-09 ENCOUNTER — Ambulatory Visit: Payer: PPO | Admitting: Family Medicine

## 2022-12-14 ENCOUNTER — Ambulatory Visit: Payer: PPO | Admitting: Family

## 2022-12-14 ENCOUNTER — Encounter: Payer: Self-pay | Admitting: Family

## 2022-12-14 VITALS — BP 145/84 | HR 60 | Temp 97.7°F | Ht 71.0 in | Wt 242.1 lb

## 2022-12-14 DIAGNOSIS — I10 Essential (primary) hypertension: Secondary | ICD-10-CM

## 2022-12-14 DIAGNOSIS — U071 COVID-19: Secondary | ICD-10-CM

## 2022-12-14 MED ORDER — METHYLPREDNISOLONE ACETATE 80 MG/ML IJ SUSP
80.0000 mg | Freq: Once | INTRAMUSCULAR | Status: AC
Start: 2022-12-14 — End: 2022-12-14
  Administered 2022-12-14: 80 mg via INTRAMUSCULAR

## 2022-12-14 NOTE — Patient Instructions (Addendum)
**Note De-Identified Gorgas Obfuscation** It was very nice to see you today!   We gave you a steroid shot to help your symptoms & to feel better a little faster.  Recommend taking over the counter antihistamine like generic Xyzal or Claritin to help control your sinus drainage, throat mucus.  Continue to take Tylenol to help your headache.  Also be sure to take your Losartan every day for blood pressure. Increase your water intake! Should be 8 cups of 8oz daily.  Let us know if not better by next week.    PLEASE NOTE:  If you had any lab tests please let us know if you have not heard back within a few days. You may see your results on MyChart before we have a chance to review them but we will give you a call once they are reviewed by [place]. If we ordered any referrals today, please let us know if you have not heard from their office within the next week.

## 2022-12-14 NOTE — Progress Notes (Signed)
**Note De-Identified Zollner Obfuscation** Patient ID: Daniel Brock, male    DOB: 1946-02-17, 77 y.o.   MRN: [id number]  Chief Complaint  Patient presents with   Covid Positive    Covid positive at home on yesterday. Sx include cough, sore throat, runny nose, headaches and chills. Sx started last week. Has tried nyquil and tylenol which did help slightly.     *Discussed the use of AI scribe software for clinical note transcription with the patient, who gave verbal consent to proceed.  History of Present Illness   The patient, with a history of COVID-19 infection and CKD, presents with a persistent cough and headache. The cough, described as 'barking' and non-productive has been ongoing for about a week. The patient also reports a sore throat and a headache, which he attributes to forgetting to take his blood pressure medication. He denies any mucus production with the cough. He also reports a runny nose, which has been watery for three days but has slowed down since yesterday. The patient has been managing his symptoms with Nyquil and Tylenol. He denies any fever, chills, or sinus pain. He also reports constant ringing in his ears but denies any hearing loss.  He denies any recent steroid shots, reports Tessalon pearles have not helped him in past.  Pt reports he tested positive for Covid 19 yesterday.        Assessment & Plan:     Covid-19 positive -  Lungs clear. Symptoms for one week including cough, headache, and runny nose. No fever, chills, or shortness of breath. No history of COPD or asthma. Do not believe Paxlovid beneficial at this point, pt also with CKD & meds that can interact. -Administer Depo-Medrol injection today for symptom relief. -Consider over-the-counter antihistamine such as Claritin or Zyrtec for runny nose.  Hypertension - Patient forgot to take blood pressure medication. 2nd check lower.  -Recheck blood pressure today at home.  -Take Losartan as prescribed daily.      Subjective:    Outpatient  Medications Prior to Visit  Medication Sig Dispense Refill   chlorhexidine (PERIDEX) 0.12 % solution 2 (two) times daily.     Cholecalciferol (VITAMIN D3 PO) Take 2,000 Units by mouth daily.     citalopram (CELEXA) 40 MG tablet Take 1 tablet (40 mg total) by mouth daily. 90 tablet 3   colchicine 0.6 MG tablet Take 1 tablet (0.6 mg total) by mouth daily as needed. 20 tablet 2   ezetimibe (ZETIA) 10 MG tablet Take 1 tablet (10 mg total) by mouth daily. 90 tablet 3   KRILL OIL PO Take by mouth.     losartan (COZAAR) 25 MG tablet Take 1 tablet (25 mg total) by mouth daily. 90 tablet 3   rosuvastatin (CRESTOR) 20 MG tablet Take 1 tablet (20 mg total) by mouth daily. 90 tablet 3   tamsulosin (FLOMAX) 0.4 MG CAPS capsule Take 2 capsules (0.8 mg total) by mouth daily. 180 capsule 3   tiZANidine (ZANAFLEX) 4 MG tablet Take 1 tablet (4 mg total) by mouth every 6 (six) hours as needed for muscle spasms. 30 tablet 1   Vitamin D, Ergocalciferol, (DRISDOL) 1.25 MG (50000 UNIT) CAPS capsule Take 1 capsule (50,000 Units total) by mouth every 7 (seven) days. 12 capsule 0   HYDROcodone-acetaminophen (NORCO/VICODIN) 5-325 MG tablet Take 1 tablet by mouth every 6 (six) hours as needed for moderate pain. (Patient not taking: Reported on 12/14/2022)     No facility-administered medications prior to visit.   Past **Note De-Identified Krinsky Obfuscation** Medical History:  Diagnosis Date   Allergy    Anxiety    Cataract    Chronic kidney disease    Depression 2016   Gout    Hiatal hernia    Hyperlipidemia    Hypertension    Renal insufficiency    Varicose veins    Past Surgical History:  Procedure Laterality Date   CARPAL TUNNEL RELEASE Left    CARPAL TUNNEL RELEASE Bilateral    CHOLECYSTECTOMY  2026   COLONOSCOPY     COLONOSCOPY W/ POLYPECTOMY     EYE SURGERY Bilateral 1958, 2004   cataracts   INGUINAL HERNIA REPAIR     JOINT REPLACEMENT  2002   TOTAL KNEE ARTHROPLASTY Right 2002   ulnar intrapment release  07/2008   VARICOSE VEIN  SURGERY Left 1980's per patient   Allergies  Allergen Reactions   Ciprofloxacin Nausea And Vomiting   Ciprofloxacin Hcl     REACTION: NAUSIA AND VOMITING   Sulfamethoxazole-Trimethoprim     REACTION: nausea and vomiting   Sulfonamide Derivatives     REACTION: itching   Sulphur [Elemental Sulfur] Rash      Objective:    Physical Exam Vitals and nursing note reviewed.  Constitutional:      General: He is not in acute distress.    Appearance: Normal appearance.  HENT:     Head: Normocephalic.     Right Ear: Tympanic membrane and ear canal normal.     Left Ear: Tympanic membrane and ear canal normal.     Nose:     Right Sinus: Frontal sinus tenderness present. No maxillary sinus tenderness.     Left Sinus: Frontal sinus tenderness present. No maxillary sinus tenderness.     Mouth/Throat:     Mouth: Mucous membranes are moist.     Pharynx: No pharyngeal swelling, oropharyngeal exudate, posterior oropharyngeal erythema or uvula swelling.     Tonsils: No tonsillar exudate or tonsillar abscesses.  Cardiovascular:     Rate and Rhythm: Normal rate and regular rhythm.  Pulmonary:     Effort: Pulmonary effort is normal.     Breath sounds: Normal breath sounds.  Musculoskeletal:        General: Normal range of motion.     Cervical back: Normal range of motion.  Lymphadenopathy:     Head:     Right side of head: No preauricular or posterior auricular adenopathy.     Left side of head: No preauricular or posterior auricular adenopathy.     Cervical: No cervical adenopathy.  Skin:    General: Skin is warm and dry.  Neurological:     Mental Status: He is alert and oriented to person, place, and time.  Psychiatric:        Mood and Affect: Mood normal.   BP (!) 160/77 (BP Location: Left Arm, Patient Position: Sitting, Cuff Size: Large)   Pulse 60   Temp 97.7 F (36.5 C) (Temporal)   Ht 5\' 11"  (1.803 m)   Wt 242 lb 2 oz (109.8 kg)   SpO2 96%   BMI 33.77 kg/m  Wt Readings from  Last 3 Encounters:  12/14/22 242 lb 2 oz (109.8 kg)  10/11/22 239 lb (108.4 kg)  09/20/22 239 lb 12.8 oz (108.8 kg)       Dulce Sellar, NP

## 2022-12-14 NOTE — Addendum Note (Signed)
**Note De-Identified Tetzlaff Obfuscation** Addended by: Candie Chroman on: 12/14/2022 02:16 PM   Modules accepted: Orders

## 2022-12-21 DIAGNOSIS — H353114 Nonexudative age-related macular degeneration, right eye, advanced atrophic with subfoveal involvement: Secondary | ICD-10-CM | POA: Diagnosis not present

## 2022-12-21 DIAGNOSIS — H35371 Puckering of macula, right eye: Secondary | ICD-10-CM | POA: Diagnosis not present

## 2022-12-21 DIAGNOSIS — H353221 Exudative age-related macular degeneration, left eye, with active choroidal neovascularization: Secondary | ICD-10-CM | POA: Diagnosis not present

## 2022-12-23 ENCOUNTER — Ambulatory Visit: Payer: PPO | Admitting: Family Medicine

## 2023-02-09 ENCOUNTER — Other Ambulatory Visit: Payer: PPO

## 2023-02-09 DIAGNOSIS — N1831 Chronic kidney disease, stage 3a: Secondary | ICD-10-CM | POA: Diagnosis not present

## 2023-02-09 DIAGNOSIS — Z6833 Body mass index (BMI) 33.0-33.9, adult: Secondary | ICD-10-CM | POA: Diagnosis not present

## 2023-02-09 DIAGNOSIS — N189 Chronic kidney disease, unspecified: Secondary | ICD-10-CM | POA: Diagnosis not present

## 2023-02-09 DIAGNOSIS — E870 Hyperosmolality and hypernatremia: Secondary | ICD-10-CM | POA: Diagnosis not present

## 2023-02-09 DIAGNOSIS — E119 Type 2 diabetes mellitus without complications: Secondary | ICD-10-CM | POA: Diagnosis not present

## 2023-02-09 DIAGNOSIS — D696 Thrombocytopenia, unspecified: Secondary | ICD-10-CM | POA: Diagnosis not present

## 2023-02-09 DIAGNOSIS — R7303 Prediabetes: Secondary | ICD-10-CM | POA: Diagnosis not present

## 2023-02-09 DIAGNOSIS — I129 Hypertensive chronic kidney disease with stage 1 through stage 4 chronic kidney disease, or unspecified chronic kidney disease: Secondary | ICD-10-CM | POA: Diagnosis not present

## 2023-02-09 DIAGNOSIS — E559 Vitamin D deficiency, unspecified: Secondary | ICD-10-CM | POA: Diagnosis not present

## 2023-02-14 ENCOUNTER — Telehealth: Payer: Self-pay | Admitting: Family Medicine

## 2023-02-14 DIAGNOSIS — H353114 Nonexudative age-related macular degeneration, right eye, advanced atrophic with subfoveal involvement: Secondary | ICD-10-CM | POA: Diagnosis not present

## 2023-02-14 DIAGNOSIS — H35371 Puckering of macula, right eye: Secondary | ICD-10-CM | POA: Diagnosis not present

## 2023-02-14 DIAGNOSIS — H353221 Exudative age-related macular degeneration, left eye, with active choroidal neovascularization: Secondary | ICD-10-CM | POA: Diagnosis not present

## 2023-02-14 NOTE — Telephone Encounter (Signed)
**Note De-Identified Coley Obfuscation** Pt's spouse is asking to have provider take over rx from previous provider. Pt is scheduled for his 3 Mo FU on 02/23/23. Please advise  RX: Tamsulosin 0.4 MG caps, Losartan 25 MG tablet, Rosuvastatin 20 MG tablet, and Exetimibe 10 MG tablet.   [facility] [address] [place] [address] Phone: [phone]  Fax: [phone]

## 2023-02-20 NOTE — Telephone Encounter (Signed)
Ok with me. Please place any necessary orders. 

## 2023-02-21 ENCOUNTER — Other Ambulatory Visit: Payer: Self-pay | Admitting: *Deleted

## 2023-02-21 DIAGNOSIS — N400 Enlarged prostate without lower urinary tract symptoms: Secondary | ICD-10-CM

## 2023-02-21 DIAGNOSIS — I10 Essential (primary) hypertension: Secondary | ICD-10-CM

## 2023-02-21 DIAGNOSIS — E781 Pure hyperglyceridemia: Secondary | ICD-10-CM

## 2023-02-21 DIAGNOSIS — E782 Mixed hyperlipidemia: Secondary | ICD-10-CM

## 2023-02-21 MED ORDER — TAMSULOSIN HCL 0.4 MG PO CAPS: ORAL_CAPSULE | ORAL | 3 refills | Status: DC

## 2023-02-21 MED ORDER — EZETIMIBE 10 MG PO TABS
10.0000 mg | ORAL_TABLET | Freq: Every day | ORAL | 3 refills | Status: DC
Start: 2023-02-21 — End: 2023-08-23

## 2023-02-21 MED ORDER — LOSARTAN POTASSIUM 25 MG PO TABS: 25.0000 mg | ORAL_TABLET | Freq: Every day | ORAL | 3 refills | Status: DC

## 2023-02-21 MED ORDER — ROSUVASTATIN CALCIUM 20 MG PO TABS
20.0000 mg | ORAL_TABLET | Freq: Every day | ORAL | 3 refills | Status: DC
Start: 2023-02-21 — End: 2023-08-23

## 2023-02-21 NOTE — Telephone Encounter (Signed)
Rx send to [facility]  

## 2023-02-23 ENCOUNTER — Encounter: Payer: Self-pay | Admitting: Family Medicine

## 2023-02-23 ENCOUNTER — Ambulatory Visit (INDEPENDENT_AMBULATORY_CARE_PROVIDER_SITE_OTHER): Payer: PPO | Admitting: Family Medicine

## 2023-02-23 VITALS — BP 122/79 | HR 54 | Temp 98.0°F | Ht 71.0 in | Wt 244.4 lb

## 2023-02-23 DIAGNOSIS — G629 Polyneuropathy, unspecified: Secondary | ICD-10-CM | POA: Diagnosis not present

## 2023-02-23 DIAGNOSIS — F411 Generalized anxiety disorder: Secondary | ICD-10-CM

## 2023-02-23 DIAGNOSIS — N400 Enlarged prostate without lower urinary tract symptoms: Secondary | ICD-10-CM

## 2023-02-23 DIAGNOSIS — I10 Essential (primary) hypertension: Secondary | ICD-10-CM

## 2023-02-23 DIAGNOSIS — E538 Deficiency of other specified B group vitamins: Secondary | ICD-10-CM | POA: Diagnosis not present

## 2023-02-23 DIAGNOSIS — N1832 Chronic kidney disease, stage 3b: Secondary | ICD-10-CM

## 2023-02-23 DIAGNOSIS — E559 Vitamin D deficiency, unspecified: Secondary | ICD-10-CM

## 2023-02-23 DIAGNOSIS — E785 Hyperlipidemia, unspecified: Secondary | ICD-10-CM

## 2023-02-23 NOTE — Assessment & Plan Note (Signed)
Stable on Celexa 40 mg daily.

## 2023-02-23 NOTE — Assessment & Plan Note (Signed)
**Note De-Identified Spratling Obfuscation** Check vitamin D.  He completed his 50,000 IUs weekly for 3 months.

## 2023-02-23 NOTE — Assessment & Plan Note (Signed)
**Note De-Identified Sarratt Obfuscation** Blood pressure at goal today on losartan 25 mg daily.  Tolerating well.  He will continue to monitor at home.

## 2023-02-23 NOTE — Assessment & Plan Note (Signed)
**Note De-Identified Menges Obfuscation** He has not had much improvement since recently starting B12 supplementation.  Will recheck today.  If B12 levels are back to normal and he still having persistent symptoms we will need to have him go back to orthopedics or sports medicine to evaluate for spinal stenosis or compressive neuropathy.

## 2023-02-23 NOTE — Assessment & Plan Note (Signed)
**Note De-Identified Yeates Obfuscation** Follows nephrology.  Recently had labs checked-not need to repeat today.

## 2023-02-23 NOTE — Patient Instructions (Addendum)
**Note De-Identified Isabell Obfuscation** It was very nice to see you today!  We will check blood work today.  We also checked a urine sample and refer you to see the urologist.  I will see back in 6 months.  Come back sooner if needed.  Return in about 6 months (around 08/24/2023) for Follow Up.   Take care, Dr Jimmey Ralph  PLEASE NOTE:  If you had any lab tests, please let us know if you have not heard back within a few days. You may see your results on mychart before we have a chance to review them but we will give you a call once they are reviewed by [place].   If we ordered any referrals today, please let us know if you have not heard from their office within the next week.   If you had any urgent prescriptions sent in today, please check with the pharmacy within an hour of our visit to make sure the prescription was transmitted appropriately.   Please try these tips to maintain a healthy lifestyle:  Eat at least 3 REAL meals and 1-2 snacks per day.  Aim for no more than 5 hours between eating.  If you eat breakfast, please do so within one hour of getting up.   Each meal should contain half fruits/vegetables, one quarter protein, and one quarter carbs (no bigger than a computer mouse)  Cut down on sweet beverages. This includes juice, soda, and sweet tea.   Drink at least 1 glass of water with each meal and aim for at least 8 glasses per day  Exercise at least 150 minutes every week.

## 2023-02-23 NOTE — Assessment & Plan Note (Signed)
**Note De-Identified Denning Obfuscation** On Crestor 10 mg daily and Zetia 10 mg daily.  He is tolerating both these well.  Check lipids today.

## 2023-02-23 NOTE — Progress Notes (Signed)
**Note De-identified Denardo Obfuscation**   **Note De-Identified Hashman Obfuscation** Daniel Brock is a 77 y.o. male who presents today for an office visit.  Assessment/Plan:  Chronic Problems Addressed Today: Essential hypertension Blood pressure at goal today on losartan 25 mg daily.  Tolerating well.  He will continue to monitor at home.  Generalized anxiety disorder Stable on Celexa 40 mg daily.  CKD (chronic kidney disease), stage III [facility]) Follows nephrology.  Recently had labs checked-not need to repeat today.  B12 deficiency Check B12 - he has been taking B12 supplement at home.  Vitamin D deficiency Check vitamin D.  He completed his 50,000 IUs weekly for 3 months.  BPH (benign prostatic hyperplasia) Still having frequent urination.  We will check UA and urine culture today to rule out other possible causes.  He is already on Flomax max dose.  Will refer to urology for further management given that this is significantly impacting his quality of life.  Dyslipidemia On Crestor 10 mg daily and Zetia 10 mg daily.  He is tolerating both these well.  Check lipids today.  Neuropathy He has not had much improvement since recently starting B12 supplementation.  Will recheck today.  If B12 levels are back to normal and he still having persistent symptoms we will need to have him go back to orthopedics or sports medicine to evaluate for spinal stenosis or compressive neuropathy.     Subjective:  HPI:  See A/P for status of chronic conditions.  Patient is here today for follow-up. I last saw him 5 months ago.  This was his initial visit with [place].  At that time we checked labs which did show low B12 and low vitamin D.  We recommended he start B12 protocol here and started vitamin D supplementation.  He has been doing both of these at home.  He is not sure if this has made any change in his symptoms.  He is still having a lot of neuropathy in his lower legs.  Since her last visit he has seen Dr. Horald Chestnut as well as his kidney doctor.  Overall he does feel well today  and has no acute concerns or questions.       Objective:  Physical Exam: BP 122/79   Pulse (!) 54   Temp 98 F (36.7 C) (Temporal)   Ht 5\' 11"  (1.803 m)   Wt 244 lb 6.4 oz (110.9 kg)   SpO2 98%   BMI 34.09 kg/m   Gen: No acute distress, resting comfortably CV: Regular rate and rhythm with no murmurs appreciated Pulm: Normal work of breathing, clear to auscultation bilaterally with no crackles, wheezes, or rhonchi Neuro: Grossly normal, moves all extremities Psych: Normal affect and thought content      Daniel Buss M. Jimmey Ralph, MD 02/23/2023 3:13 PM

## 2023-02-23 NOTE — Assessment & Plan Note (Addendum)
**Note De-Identified Owensby Obfuscation** Check B12 - he has been taking B12 supplement at home.

## 2023-02-23 NOTE — Assessment & Plan Note (Signed)
**Note De-Identified Fate Obfuscation** Still having frequent urination.  We will check UA and urine culture today to rule out other possible causes.  He is already on Flomax max dose.  Will refer to urology for further management given that this is significantly impacting his quality of life.

## 2023-02-24 LAB — URINALYSIS, ROUTINE W REFLEX MICROSCOPIC
Bilirubin Urine: NEGATIVE
Hgb urine dipstick: NEGATIVE
Ketones, ur: NEGATIVE
Leukocytes,Ua: NEGATIVE
Nitrite: NEGATIVE
RBC / HPF: NONE SEEN (ref 0–?)
Specific Gravity, Urine: 1.025 (ref 1.000–1.030)
Total Protein, Urine: NEGATIVE
Urine Glucose: NEGATIVE
Urobilinogen, UA: 0.2 (ref 0.0–1.0)
WBC, UA: NONE SEEN (ref 0–?)
pH: 5.5 (ref 5.0–8.0)

## 2023-02-24 LAB — URINE CULTURE
MICRO NUMBER:: 15813682
Result:: NO GROWTH
SPECIMEN QUALITY:: ADEQUATE

## 2023-02-24 LAB — LIPID PANEL
Cholesterol: 145 mg/dL (ref 0–200)
HDL: 35.1 mg/dL — ABNORMAL LOW (ref >39.00)
LDL Cholesterol: 57 mg/dL (ref 0–99)
NonHDL: 110.2
Total CHOL/HDL Ratio: 4
Triglycerides: 267 mg/dL — ABNORMAL HIGH (ref 0.0–149.0)
VLDL: 53.4 mg/dL — ABNORMAL HIGH (ref 0.0–40.0)

## 2023-02-24 LAB — VITAMIN B12: Vitamin B-12: 246 pg/mL (ref 211–911)

## 2023-02-24 LAB — VITAMIN D 25 HYDROXY (VIT D DEFICIENCY, FRACTURES): VITD: 23.71 ng/mL — ABNORMAL LOW (ref 30.00–100.00)

## 2023-02-27 DIAGNOSIS — D696 Thrombocytopenia, unspecified: Secondary | ICD-10-CM | POA: Diagnosis not present

## 2023-02-27 DIAGNOSIS — R7303 Prediabetes: Secondary | ICD-10-CM | POA: Diagnosis not present

## 2023-02-27 DIAGNOSIS — N1831 Chronic kidney disease, stage 3a: Secondary | ICD-10-CM | POA: Diagnosis not present

## 2023-02-27 DIAGNOSIS — I129 Hypertensive chronic kidney disease with stage 1 through stage 4 chronic kidney disease, or unspecified chronic kidney disease: Secondary | ICD-10-CM | POA: Diagnosis not present

## 2023-02-27 NOTE — Progress Notes (Signed)
**Note De-Identified Dikes Obfuscation** Vitamin D is improving but still a bit low.  Recommend continuing 5000 IUs once daily for maintenance therapy and we can recheck in 3 to 6 months.  B12 is improving but on the low side of normal.  Recommend he continue with 1000 mcg daily.  We can also check this in 3 to 6 months.  The rest of the labs are stable.

## 2023-03-13 ENCOUNTER — Other Ambulatory Visit: Payer: Self-pay | Admitting: *Deleted

## 2023-03-13 MED ORDER — VITAMIN D (ERGOCALCIFEROL) 1.25 MG (50000 UNIT) PO CAPS: 50000.0000 [IU] | ORAL_CAPSULE | ORAL | 0 refills | Status: AC

## 2023-04-11 DIAGNOSIS — H353114 Nonexudative age-related macular degeneration, right eye, advanced atrophic with subfoveal involvement: Secondary | ICD-10-CM | POA: Diagnosis not present

## 2023-04-11 DIAGNOSIS — H35371 Puckering of macula, right eye: Secondary | ICD-10-CM | POA: Diagnosis not present

## 2023-04-11 DIAGNOSIS — H4423 Degenerative myopia, bilateral: Secondary | ICD-10-CM | POA: Diagnosis not present

## 2023-04-11 DIAGNOSIS — H353221 Exudative age-related macular degeneration, left eye, with active choroidal neovascularization: Secondary | ICD-10-CM | POA: Diagnosis not present

## 2023-04-11 DIAGNOSIS — H353124 Nonexudative age-related macular degeneration, left eye, advanced atrophic with subfoveal involvement: Secondary | ICD-10-CM | POA: Diagnosis not present

## 2023-04-20 ENCOUNTER — Encounter: Payer: Self-pay | Admitting: Urology

## 2023-04-20 ENCOUNTER — Ambulatory Visit: Payer: PPO | Admitting: Urology

## 2023-04-20 VITALS — BP 143/81 | HR 61

## 2023-04-20 DIAGNOSIS — N3941 Urge incontinence: Secondary | ICD-10-CM

## 2023-04-20 DIAGNOSIS — N4342 Spermatocele of epididymis, multiple: Secondary | ICD-10-CM | POA: Diagnosis not present

## 2023-04-20 DIAGNOSIS — R350 Frequency of micturition: Secondary | ICD-10-CM

## 2023-04-20 DIAGNOSIS — Z87442 Personal history of urinary calculi: Secondary | ICD-10-CM | POA: Diagnosis not present

## 2023-04-20 DIAGNOSIS — N401 Enlarged prostate with lower urinary tract symptoms: Secondary | ICD-10-CM

## 2023-04-20 DIAGNOSIS — N138 Other obstructive and reflux uropathy: Secondary | ICD-10-CM

## 2023-04-20 DIAGNOSIS — R3915 Urgency of urination: Secondary | ICD-10-CM

## 2023-04-20 DIAGNOSIS — R351 Nocturia: Secondary | ICD-10-CM

## 2023-04-20 LAB — URINALYSIS, ROUTINE W REFLEX MICROSCOPIC
Bilirubin, UA: NEGATIVE
Glucose, UA: NEGATIVE
Ketones, UA: NEGATIVE
Leukocytes,UA: NEGATIVE
Nitrite, UA: NEGATIVE
Protein,UA: NEGATIVE
RBC, UA: NEGATIVE
Specific Gravity, UA: 1.03 (ref 1.005–1.030)
Urobilinogen, Ur: 0.2 mg/dL (ref 0.2–1.0)
pH, UA: 5.5 (ref 5.0–7.5)

## 2023-04-20 MED ORDER — ALFUZOSIN HCL ER 10 MG PO TB24: 10.0000 mg | ORAL_TABLET | Freq: Every day | ORAL | 11 refills | Status: DC

## 2023-04-20 NOTE — Progress Notes (Signed)
**Note De-Identified Porada Obfuscation** Subjective: 1. Benign prostatic hyperplasia with lower urinary tract symptoms, symptom details unspecified   2. Urgency of urination   3. Urinary frequency   4. Nocturia   5. Urge incontinence   6. Multiple spermatoceles of right epididymis   7. History of nephrolithiasis      Consult requested by Dr. Jacquiline Doe.  04/20/23: Daniel Brock is a 78 yo male who is sent for BPH with LUTS.  He has been on tamsulosin.  He has some groin itching on the tamsulosin.  He has a sulfa allergy.  He has frequency q3hrs and nocturia q2hrs.  He has some urgency with UUI.  He has a reduced stream and doesn't feel he empties.   He can have some postvoid dribbling.  He has had prostatitis in his 20's.  He had a stone in 2006-7.  He had ureteroscopy for the stone.  He has some issues since then with the voiding.  He has no hematuria and rarely has dysuria if he drinks too much tea.  HIs last PSA was 2.5 in 8/23 with a 43% f/t ratio.  He has CKD 3.  His UA is clear.  He had a renal US in 2020 and there was a small RUP stone.  He has a family history of Huntington's disease but doesn't appear to be afflicted.  ROS:  ROS  Allergies  Allergen Reactions   Ciprofloxacin Nausea And Vomiting   Ciprofloxacin Hcl     REACTION: NAUSIA AND VOMITING   Sulfamethoxazole-Trimethoprim     REACTION: nausea and vomiting   Sulfonamide Derivatives     REACTION: itching   Sulphur [Elemental Sulfur] Rash    Past Medical History:  Diagnosis Date   Allergy    Anxiety    Cataract    Chronic kidney disease    Depression 2016   Gout    Hiatal hernia    Hyperlipidemia    Hypertension    Renal insufficiency    Varicose veins     Past Surgical History:  Procedure Laterality Date   CARPAL TUNNEL RELEASE Left    CARPAL TUNNEL RELEASE Bilateral    CHOLECYSTECTOMY  2026   COLONOSCOPY     COLONOSCOPY W/ POLYPECTOMY     EYE SURGERY Bilateral 1958, 2004   cataracts   INGUINAL HERNIA REPAIR     JOINT REPLACEMENT  2002    TOTAL KNEE ARTHROPLASTY Right 2002   ulnar intrapment release  07/2008   VARICOSE VEIN SURGERY Left 1980's per patient    Social History   Socioeconomic History   Marital status: Married    Spouse name: Harriett Sine   Number of children: Not on file   Years of education: Not on file   Highest education level: Not on file  Occupational History   Occupation: retired  Tobacco Use   Smoking status: Never   Smokeless tobacco: Never  Vaping Use   Vaping status: Never Used  Substance and Sexual Activity   Alcohol use: No   Drug use: No   Sexual activity: Never  Other Topics Concern   Not on file  Social History Narrative   Not on file   Social Drivers of Health   Financial Resource Strain: Low Risk  (09/24/2021)   Overall Financial Resource Strain (CARDIA)    Difficulty of Paying Living Expenses: Not hard at all  Food Insecurity: No Food Insecurity (09/24/2021)   Hunger Vital Sign    Worried About Running Out of Food in the Last Year: Never **Note De-Identified Cheslock Obfuscation** true    Ran Out of Food in the Last Year: Never true  Transportation Needs: No Transportation Needs (09/24/2021)   PRAPARE - Administrator, Civil Service (Medical): No    Lack of Transportation (Non-Medical): No  Physical Activity: Inactive (10/11/2022)   Exercise Vital Sign    Days of Exercise per Week: 0 days    Minutes of Exercise per Session: 0 min  Stress: No Stress Concern Present (10/11/2022)   [employer] of Occupational Health - Occupational Stress Questionnaire    Feeling of Stress : Not at all  Social Connections: Moderately Isolated (10/11/2022)   Social Connection and Isolation Panel [NHANES]    Frequency of Communication with Friends and Family: More than three times a week    Frequency of Social Gatherings with Friends and Family: Once a week    Attends Religious Services: Never    Database administrator or Organizations: No    Attends Banker Meetings: Never    Marital Status: Married  Careers information officer Violence: Not At Risk (10/11/2022)   Humiliation, Afraid, Rape, and Kick questionnaire    Fear of Current or Ex-Partner: No    Emotionally Abused: No    Physically Abused: No    Sexually Abused: No    Family History  Problem Relation Age of Onset   Hypertension Mother    Hyperlipidemia Mother    Arthritis Mother    Heart disease Mother 74       CABG   Huntington's disease Mother    Cancer Father        pancreatic cancer   Arthritis Father    Diabetes Father    Hypertension Father    Hyperlipidemia Sister    Hypertension Sister    Hypertension Brother    Vision loss Brother    Huntington's disease Brother    Heart disease Maternal Grandmother    Early death Maternal Grandfather    Colon cancer Neg Hx    Esophageal cancer Neg Hx    Rectal cancer Neg Hx    Stomach cancer Neg Hx     Anti-infectives: Anti-infectives (From admission, onward)    None       Current Outpatient Medications  Medication Sig Dispense Refill   alfuzosin (UROXATRAL) 10 MG 24 hr tablet Take 1 tablet (10 mg total) by mouth daily with breakfast. 30 tablet 11   chlorhexidine (PERIDEX) 0.12 % solution 2 (two) times daily.     Cholecalciferol (VITAMIN D3 PO) Take 2,000 Units by mouth daily.     citalopram (CELEXA) 40 MG tablet Take 1 tablet (40 mg total) by mouth daily. 90 tablet 3   colchicine 0.6 MG tablet Take 1 tablet (0.6 mg total) by mouth daily as needed. 20 tablet 2   ezetimibe (ZETIA) 10 MG tablet Take 1 tablet (10 mg total) by mouth daily. 90 tablet 3   HYDROcodone-acetaminophen (NORCO/VICODIN) 5-325 MG tablet Take 1 tablet by mouth every 6 (six) hours as needed for moderate pain (pain score 4-6).     KRILL OIL PO Take by mouth.     losartan (COZAAR) 25 MG tablet Take 1 tablet (25 mg total) by mouth daily. 90 tablet 3   rosuvastatin (CRESTOR) 20 MG tablet Take 1 tablet (20 mg total) by mouth daily. 90 tablet 3   tiZANidine (ZANAFLEX) 4 MG tablet Take 1 tablet (4 mg total) by mouth  every 6 (six) hours as needed for muscle spasms. 30 tablet 1 **Note De-Identified Thurlow Obfuscation** Vitamin D, Ergocalciferol, (DRISDOL) 1.25 MG (50000 UNIT) CAPS capsule Take 1 capsule (50,000 Units total) by mouth every 7 (seven) days. 12 capsule 0   No current facility-administered medications for this visit.     Objective: Vital signs in last 24 hours: BP (!) 143/81   Pulse 61   Intake/Output from previous day: No intake/output data recorded. Intake/Output this shift: @IOTHISSHIFT @   Physical Exam Vitals reviewed.  Constitutional:      Appearance: Normal appearance.  Cardiovascular:     Rate and Rhythm: Normal rate and regular rhythm.     Heart sounds: Normal heart sounds.  Pulmonary:     Effort: Pulmonary effort is normal.     Breath sounds: Normal breath sounds.  Abdominal:     General: Abdomen is flat.     Palpations: Abdomen is soft.     Hernia: No hernia is present.  Genitourinary:    Comments: Nl phallus with adequate meatus. Scrotum normal. Testes normal. RIght spermatocele abotu 3 cm. AP without lesions. NST without mass. Prostate is 2+ benign  SV non-palpable.  Musculoskeletal:        General: No swelling. Normal range of motion.  Skin:    General: Skin is warm and dry.  Neurological:     General: No focal deficit present.     Mental Status: He is alert and oriented to person, place, and time.  Psychiatric:        Mood and Affect: Mood normal.        Behavior: Behavior normal.     Lab Results:  Results for orders placed or performed in visit on 04/20/23 (from the past 24 hours)  Urinalysis, Routine w reflex microscopic     Status: None   Collection Time: 04/20/23  2:03 PM  Result Value Ref Range   Specific Gravity, UA 1.030 1.005 - 1.030   pH, UA 5.5 5.0 - 7.5   Color, UA Yellow Yellow   Appearance Ur Clear Clear   Leukocytes,UA Negative Negative   Protein,UA Negative Negative/Trace   Glucose, UA Negative Negative   Ketones, UA Negative Negative   RBC, UA Negative Negative    Bilirubin, UA Negative Negative   Urobilinogen, Ur 0.2 0.2 - 1.0 mg/dL   Nitrite, UA Negative Negative   Microscopic Examination Comment    Narrative   Performed at:  [address] [facility] [address] [place], [place]  [id number] Lab Director: Chinita Pester MT, Phone:  [phone]    BMET No results for input(s): "NA", "K", "CL", "CO2", "GLUCOSE", "BUN", "CREATININE", "CALCIUM" in the last 72 hours. PT/INR No results for input(s): "LABPROT", "INR" in the last 72 hours. ABG No results for input(s): "PHART", "HCO3" in the last 72 hours.  Invalid input(s): "PCO2", "PO2" Prior scrotal US from 2023 reviewed.  He had a right spermatocele.   UA is clear.  Studies/Results: No results found.   Assessment/Plan: BPH with BOO.   I am going to switch him to alfuzosin because of itching with the tamsulosin which maybe causing an allergic reaction since he has an allergy to sulfa.  He will return for a flowrate, PVR and possible cystoscopy.  Right spermatocele.  He has minimal symptoms.   Meds ordered this encounter  Medications   alfuzosin (UROXATRAL) 10 MG 24 hr tablet    Sig: Take 1 tablet (10 mg total) by mouth daily with breakfast.    Dispense:  30 tablet    Refill:  11     Orders Placed **Note De-Identified Mcbryar Obfuscation** This Encounter  Procedures   Urinalysis, Routine w reflex microscopic     Return for Next available from a flow rate, PVR and possible cystoscopy. .    CC:: Dr. Jacquiline Doe.      Bjorn Pippin 04/21/2023

## 2023-04-27 ENCOUNTER — Ambulatory Visit (INDEPENDENT_AMBULATORY_CARE_PROVIDER_SITE_OTHER): Payer: PPO

## 2023-04-27 DIAGNOSIS — Z23 Encounter for immunization: Secondary | ICD-10-CM | POA: Diagnosis not present

## 2023-04-27 NOTE — Progress Notes (Signed)
**Note De-Identified Spooner Obfuscation** Patient was given the high dose flu vaccine in the right arm today. Patient tolerated injection well. Lord Rod, CMA

## 2023-06-13 DIAGNOSIS — H4423 Degenerative myopia, bilateral: Secondary | ICD-10-CM | POA: Diagnosis not present

## 2023-06-13 DIAGNOSIS — H353221 Exudative age-related macular degeneration, left eye, with active choroidal neovascularization: Secondary | ICD-10-CM | POA: Diagnosis not present

## 2023-06-13 DIAGNOSIS — H353114 Nonexudative age-related macular degeneration, right eye, advanced atrophic with subfoveal involvement: Secondary | ICD-10-CM | POA: Diagnosis not present

## 2023-06-13 DIAGNOSIS — H353124 Nonexudative age-related macular degeneration, left eye, advanced atrophic with subfoveal involvement: Secondary | ICD-10-CM | POA: Diagnosis not present

## 2023-06-13 DIAGNOSIS — H35371 Puckering of macula, right eye: Secondary | ICD-10-CM | POA: Diagnosis not present

## 2023-06-29 ENCOUNTER — Other Ambulatory Visit: Payer: PPO | Admitting: Urology

## 2023-07-18 DIAGNOSIS — H353114 Nonexudative age-related macular degeneration, right eye, advanced atrophic with subfoveal involvement: Secondary | ICD-10-CM | POA: Diagnosis not present

## 2023-07-18 DIAGNOSIS — H353221 Exudative age-related macular degeneration, left eye, with active choroidal neovascularization: Secondary | ICD-10-CM | POA: Diagnosis not present

## 2023-07-18 DIAGNOSIS — H353124 Nonexudative age-related macular degeneration, left eye, advanced atrophic with subfoveal involvement: Secondary | ICD-10-CM | POA: Diagnosis not present

## 2023-07-18 DIAGNOSIS — H4423 Degenerative myopia, bilateral: Secondary | ICD-10-CM | POA: Diagnosis not present

## 2023-07-18 DIAGNOSIS — H35371 Puckering of macula, right eye: Secondary | ICD-10-CM | POA: Diagnosis not present

## 2023-08-04 DIAGNOSIS — I10 Essential (primary) hypertension: Secondary | ICD-10-CM | POA: Diagnosis not present

## 2023-08-04 DIAGNOSIS — D631 Anemia in chronic kidney disease: Secondary | ICD-10-CM | POA: Diagnosis not present

## 2023-08-04 DIAGNOSIS — R809 Proteinuria, unspecified: Secondary | ICD-10-CM | POA: Diagnosis not present

## 2023-08-04 DIAGNOSIS — N189 Chronic kidney disease, unspecified: Secondary | ICD-10-CM | POA: Diagnosis not present

## 2023-08-04 DIAGNOSIS — E119 Type 2 diabetes mellitus without complications: Secondary | ICD-10-CM | POA: Diagnosis not present

## 2023-08-23 ENCOUNTER — Encounter: Payer: Self-pay | Admitting: Family Medicine

## 2023-08-23 ENCOUNTER — Ambulatory Visit (INDEPENDENT_AMBULATORY_CARE_PROVIDER_SITE_OTHER): Admitting: Family Medicine

## 2023-08-23 VITALS — BP 109/56 | HR 57 | Temp 97.3°F | Ht 71.0 in | Wt 244.4 lb

## 2023-08-23 DIAGNOSIS — E785 Hyperlipidemia, unspecified: Secondary | ICD-10-CM | POA: Diagnosis not present

## 2023-08-23 DIAGNOSIS — N400 Enlarged prostate without lower urinary tract symptoms: Secondary | ICD-10-CM | POA: Diagnosis not present

## 2023-08-23 DIAGNOSIS — E781 Pure hyperglyceridemia: Secondary | ICD-10-CM | POA: Diagnosis not present

## 2023-08-23 DIAGNOSIS — N1832 Chronic kidney disease, stage 3b: Secondary | ICD-10-CM

## 2023-08-23 DIAGNOSIS — I10 Essential (primary) hypertension: Secondary | ICD-10-CM | POA: Diagnosis not present

## 2023-08-23 DIAGNOSIS — E782 Mixed hyperlipidemia: Secondary | ICD-10-CM

## 2023-08-23 DIAGNOSIS — H6120 Impacted cerumen, unspecified ear: Secondary | ICD-10-CM

## 2023-08-23 DIAGNOSIS — M109 Gout, unspecified: Secondary | ICD-10-CM

## 2023-08-23 DIAGNOSIS — F411 Generalized anxiety disorder: Secondary | ICD-10-CM | POA: Diagnosis not present

## 2023-08-23 MED ORDER — CITALOPRAM HYDROBROMIDE 40 MG PO TABS: 40.0000 mg | ORAL_TABLET | Freq: Every day | ORAL | 3 refills | Status: AC

## 2023-08-23 MED ORDER — LOSARTAN POTASSIUM 25 MG PO TABS
25.0000 mg | ORAL_TABLET | Freq: Every day | ORAL | 3 refills | Status: DC
Start: 2023-08-23 — End: 2023-10-30

## 2023-08-23 MED ORDER — TIZANIDINE HCL 4 MG PO TABS: 4.0000 mg | ORAL_TABLET | Freq: Four times a day (QID) | ORAL | 1 refills | Status: AC | PRN

## 2023-08-23 MED ORDER — EZETIMIBE 10 MG PO TABS: 10.0000 mg | ORAL_TABLET | Freq: Every day | ORAL | 3 refills | Status: AC

## 2023-08-23 MED ORDER — ALFUZOSIN HCL ER 10 MG PO TB24: 10.0000 mg | ORAL_TABLET | Freq: Every day | ORAL | 11 refills | Status: AC

## 2023-08-23 MED ORDER — ROSUVASTATIN CALCIUM 20 MG PO TABS: 20.0000 mg | ORAL_TABLET | Freq: Every day | ORAL | 3 refills | Status: AC

## 2023-08-23 MED ORDER — COLCHICINE 0.6 MG PO TABS: 0.6000 mg | ORAL_TABLET | Freq: Every day | ORAL | 2 refills | Status: AC | PRN

## 2023-08-23 NOTE — Patient Instructions (Addendum)
**Note De-Identified Heldt Obfuscation** It was very nice to see you today!  I will refill all of your medications today  We flushed out your ears today.  You can use Debrox as needed for the rim buildup.  I will see back in 6 months for your annual physical.  Come back sooner if needed.  Return in about 6 months (around 02/22/2024) for Annual Physical.   Take care, Dr Daneil Dunker  PLEASE NOTE:  If you had any lab tests, please let us  know if you have not heard back within a few days. You may see your results on mychart before we have a chance to review them but we will give you a call once they are reviewed by us .   If we ordered any referrals today, please let us  know if you have not heard from their office within the next week.   If you had any urgent prescriptions sent in today, please check with the pharmacy within an hour of our visit to make sure the prescription was transmitted appropriately.   Please try these tips to maintain a healthy lifestyle:  Eat at least 3 REAL meals and 1-2 snacks per day.  Aim for no more than 5 hours between eating.  If you eat breakfast, please do so within one hour of getting up.   Each meal should contain half fruits/vegetables, one quarter protein, and one quarter carbs (no bigger than a computer mouse)  Cut down on sweet beverages. This includes juice, soda, and sweet tea.   Drink at least 1 glass of water with each meal and aim for at least 8 glasses per day  Exercise at least 150 minutes every week.

## 2023-08-23 NOTE — Assessment & Plan Note (Signed)
Continue management per nephrology

## 2023-08-23 NOTE — Assessment & Plan Note (Signed)
**Note De-Identified Goding Obfuscation** Follows with urology for this.  On alfuzosin  10 mg daily and tolerating well.  Will refill today

## 2023-08-23 NOTE — Assessment & Plan Note (Signed)
**Note De-Identified Herrin Obfuscation** Stable on Crestor  10 mg daily and Zetia  10 mg daily.  Tolerating both of these well.  Will check lipids when he comes back in for CPE later this year.

## 2023-08-23 NOTE — Assessment & Plan Note (Signed)
**Note De-Identified Helderman Obfuscation** Stable.  No recent flares.  Will refill his colchicine  today.  Check uric acid level in 6 months at his CPE.

## 2023-08-23 NOTE — Progress Notes (Addendum)
**Note De-identified Roarty Obfuscation**   **Note De-Identified Nadal Obfuscation** Daniel Brock is a 78 y.o. male who presents today for an office visit.  Assessment/Plan:  New/Acute Problems: Cerumen impaction Irrigation and instrumentation were attempted today with some improvement however not able to fully debride ear canal of cerumen.  We did discuss referral to ENT however he would like to hold off on this if possible.  Recommended that he use Debrox daily for the next week and come back here next week for another attempt at irrigation or instrumentation.  Chronic Problems Addressed Today: Essential hypertension Blood pressure at goal today losartan  25 mg daily.  Gout Stable.  No recent flares.  Will refill his colchicine  today.  Check uric acid level in 6 months at his CPE.  BPH (benign prostatic hyperplasia) Follows with urology for this.  On alfuzosin  10 mg daily and tolerating well.  Will refill today  Dyslipidemia Stable on Crestor  10 mg daily and Zetia  10 mg daily.  Tolerating both of these well.  Will check lipids when he comes back in for CPE later this year.  Generalized anxiety disorder Doing well on Celexa  40 mg daily.  Will refill today.  CKD (chronic kidney disease), stage III (HCC) Continue management per nephrology.     Subjective:  HPI:  See Assessment / plan for status of chronic conditions. He is here today for 6 months follow up.  He is doing well today.  He has noticed some bilateral ear fullness right worse than left over the last several days.  He is worried about earwax blockage.  He has had this in the past.  Tried using Debrox at home without much improvement.  He needs refill on all of his prescriptions today sent to new pharmacy.       Objective:  Physical Exam: BP (!) 109/56   Pulse (!) 57   Temp (!) 97.3 F (36.3 C) (Temporal)   Ht 5\' 11"  (1.803 m)   Wt 244 lb 6.4 oz (110.9 kg)   SpO2 97%   BMI 34.09 kg/m   Gen: No acute distress, resting comfortably HEENT: Bilateral EAC obscured by cerumen.  Narrow AC noted  bilaterally. CV: Regular rate and rhythm with no murmurs appreciated Pulm: Normal work of breathing, clear to auscultation bilaterally with no crackles, wheezes, or rhonchi Neuro: Grossly normal, moves all extremities Psych: Normal affect and thought content  Procedure Note Verbal consent obtained.  Bilateral EAC were irrigated with combination of warm water and hydrogen peroxide.  Patient did not report any relief in symptoms with this.  Area was visualized in right EAC with persistent ceruminosis and cerumen impaction.  Area was attempted to be manually debrided with curette however not able to successfully remove excess cerumen.  Irritation was noted along EAC and further attempts at removal were stopped.      Jinny Mounts. Daneil Dunker, MD 08/23/2023 10:53 AM

## 2023-08-23 NOTE — Assessment & Plan Note (Signed)
 Doing well on Celexa 40 mg daily.  Will refill today.

## 2023-08-23 NOTE — Assessment & Plan Note (Signed)
**Note De-Identified Burston Obfuscation** Blood pressure at goal today losartan  25 mg daily.

## 2023-08-24 ENCOUNTER — Ambulatory Visit: Payer: PPO | Admitting: Family Medicine

## 2023-08-24 DIAGNOSIS — H353124 Nonexudative age-related macular degeneration, left eye, advanced atrophic with subfoveal involvement: Secondary | ICD-10-CM | POA: Diagnosis not present

## 2023-08-24 DIAGNOSIS — H353114 Nonexudative age-related macular degeneration, right eye, advanced atrophic with subfoveal involvement: Secondary | ICD-10-CM | POA: Diagnosis not present

## 2023-08-24 DIAGNOSIS — H4423 Degenerative myopia, bilateral: Secondary | ICD-10-CM | POA: Diagnosis not present

## 2023-08-24 DIAGNOSIS — H35371 Puckering of macula, right eye: Secondary | ICD-10-CM | POA: Diagnosis not present

## 2023-08-24 DIAGNOSIS — H353221 Exudative age-related macular degeneration, left eye, with active choroidal neovascularization: Secondary | ICD-10-CM | POA: Diagnosis not present

## 2023-08-25 ENCOUNTER — Ambulatory Visit: Admitting: Family Medicine

## 2023-08-28 DIAGNOSIS — E870 Hyperosmolality and hypernatremia: Secondary | ICD-10-CM | POA: Diagnosis not present

## 2023-08-28 DIAGNOSIS — I129 Hypertensive chronic kidney disease with stage 1 through stage 4 chronic kidney disease, or unspecified chronic kidney disease: Secondary | ICD-10-CM | POA: Diagnosis not present

## 2023-08-28 DIAGNOSIS — D696 Thrombocytopenia, unspecified: Secondary | ICD-10-CM | POA: Diagnosis not present

## 2023-08-28 DIAGNOSIS — N1831 Chronic kidney disease, stage 3a: Secondary | ICD-10-CM | POA: Diagnosis not present

## 2023-08-30 ENCOUNTER — Telehealth: Payer: Self-pay | Admitting: Family Medicine

## 2023-08-30 ENCOUNTER — Other Ambulatory Visit: Payer: Self-pay | Admitting: *Deleted

## 2023-08-30 ENCOUNTER — Telehealth: Payer: Self-pay | Admitting: *Deleted

## 2023-08-30 NOTE — Telephone Encounter (Signed)
**Note De-Identified Scheel Obfuscation** Copied from CRM [id number]. Topic: General - Other >> Aug 30, 2023  9:51 AM [place] A wrote: Reason for CRM: Patient called in to request to come in a see [facility] without appointment. Patient said that Bernell Brigham told him during his previous appointment on 6/4 that if he was not doing better to just stop by and patient wanted to know what time she went to lunch so that he would not come during that time. Patient would not accept agent sending message to nurse. Agent called [facility] and was told to transfer patient but Bernell Brigham was with a patient   Ok per Dr Daneil Dunker, appt schedule  Horald Lyme

## 2023-08-30 NOTE — Telephone Encounter (Signed)
**Note De-Identified Petraglia Obfuscation** Ok Per Dr Daneil Dunker to schedule a nurse visit for irrigation

## 2023-08-30 NOTE — Telephone Encounter (Signed)
**Note De-Identified Boven Obfuscation** Pt's wife called in stating pt was to come in for irrigation. Explained to patient this needs to be scheduled as an office visit. They stated they were told by provider & cma they did not need an appointment. Advised primary did not have any openings but did have some with other providers. They refused stating they did not need appointment, would be speaking to primary about the situation, & then hung up on me.

## 2023-08-31 ENCOUNTER — Ambulatory Visit: Admitting: *Deleted

## 2023-08-31 DIAGNOSIS — H938X3 Other specified disorders of ear, bilateral: Secondary | ICD-10-CM

## 2023-08-31 NOTE — Progress Notes (Signed)
**Note De-Identified Lepera Obfuscation** Patient present for ear irrigation as recommended by PCP  Ears clean no visible irritation or redness. Patient tolerated well  Has an ENT appt this coming Tuesday

## 2023-09-04 DIAGNOSIS — H6123 Impacted cerumen, bilateral: Secondary | ICD-10-CM | POA: Diagnosis not present

## 2023-09-28 DIAGNOSIS — H353124 Nonexudative age-related macular degeneration, left eye, advanced atrophic with subfoveal involvement: Secondary | ICD-10-CM | POA: Diagnosis not present

## 2023-09-28 DIAGNOSIS — H353114 Nonexudative age-related macular degeneration, right eye, advanced atrophic with subfoveal involvement: Secondary | ICD-10-CM | POA: Diagnosis not present

## 2023-09-28 DIAGNOSIS — H35371 Puckering of macula, right eye: Secondary | ICD-10-CM | POA: Diagnosis not present

## 2023-09-28 DIAGNOSIS — H353221 Exudative age-related macular degeneration, left eye, with active choroidal neovascularization: Secondary | ICD-10-CM | POA: Diagnosis not present

## 2023-09-28 DIAGNOSIS — H4423 Degenerative myopia, bilateral: Secondary | ICD-10-CM | POA: Diagnosis not present

## 2023-10-17 ENCOUNTER — Ambulatory Visit: Payer: PPO

## 2023-10-25 ENCOUNTER — Ambulatory Visit: Admitting: Nurse Practitioner

## 2023-10-30 ENCOUNTER — Ambulatory Visit (INDEPENDENT_AMBULATORY_CARE_PROVIDER_SITE_OTHER): Admitting: Family Medicine

## 2023-10-30 ENCOUNTER — Encounter: Payer: Self-pay | Admitting: Family Medicine

## 2023-10-30 ENCOUNTER — Ambulatory Visit

## 2023-10-30 VITALS — BP 102/66 | HR 71 | Temp 97.5°F | Ht 71.0 in | Wt 245.6 lb

## 2023-10-30 DIAGNOSIS — M25511 Pain in right shoulder: Secondary | ICD-10-CM

## 2023-10-30 DIAGNOSIS — S4991XA Unspecified injury of right shoulder and upper arm, initial encounter: Secondary | ICD-10-CM | POA: Diagnosis not present

## 2023-10-30 DIAGNOSIS — I10 Essential (primary) hypertension: Secondary | ICD-10-CM | POA: Diagnosis not present

## 2023-10-30 DIAGNOSIS — M19011 Primary osteoarthritis, right shoulder: Secondary | ICD-10-CM | POA: Diagnosis not present

## 2023-10-30 NOTE — Patient Instructions (Signed)
**Note De-Identified Teeple Obfuscation** It was very nice to see you today!  VISIT SUMMARY: During your visit, we addressed your right shoulder pain and low blood pressure. We discussed possible causes and treatment plans for each issue.  YOUR PLAN: RIGHT SHOULDER PAIN: You have persistent pain in your right shoulder, likely due to a possible rotator cuff injury from your fall. -We will order an x-ray of your right shoulder. -You will start physical therapy to help with the pain and improve your range of motion. -If there is no improvement, we may refer you to an orthopedic specialist.  HYPOTENSION: You have low blood pressure, which may be causing your dizziness and lightheadedness. -Stop taking losartan  as it may be contributing to your low blood pressure. -Monitor your blood pressure at home twice a week. -Report your blood pressure readings to us  in a few weeks.  Return in about 6 months (around 05/01/2024) for Annual Physical.   Take care, Dr Kennyth  PLEASE NOTE:  If you had any lab tests, please let us  know if you have not heard back within a few days. You may see your results on mychart before we have a chance to review them but we will give you a call once they are reviewed by us .   If we ordered any referrals today, please let us  know if you have not heard from their office within the next week.   If you had any urgent prescriptions sent in today, please check with the pharmacy within an hour of our visit to make sure the prescription was transmitted appropriately.   Please try these tips to maintain a healthy lifestyle:  Eat at least 3 REAL meals and 1-2 snacks per day.  Aim for no more than 5 hours between eating.  If you eat breakfast, please do so within one hour of getting up.   Each meal should contain half fruits/vegetables, one quarter protein, and one quarter carbs (no bigger than a computer mouse)  Cut down on sweet beverages. This includes juice, soda, and sweet tea.   Drink at least 1 glass of water  with each meal and aim for at least 8 glasses per day  Exercise at least 150 minutes every week.

## 2023-10-30 NOTE — Progress Notes (Signed)
**Note De-Identified Borgwardt Obfuscation** Daniel Brock is a 78 y.o. male who presents today for an office visit.  Assessment/Plan:  New/Acute Problems: Right Shoulder Pain  Concern for rotator cuff tendinopathy/tear given his recent fall.  Will check plain film today to further evaluate.  Will also refer him to see physical therapy.  May consider referral to sports medicine depending on response to physical therapy.  We discussed reasons to return to care.  Chronic Problems Addressed Today: Essential hypertension Blood pressure on low side and he has had several lows over the last few months with systolics as low as the 70s and 80s.  He is occasionally getting dizziness as well. We will discontinue his losartan .  He will monitor at home and follow-up with us  in a few weeks.     Subjective:  HPI:  See assessment / plan for status of chronic conditions.  Patient here today for follow-up.  I last saw him about 2 months ago.  At our last visit he was having issues with cerumen impaction and we attempted to remove in the office however right successful.  Recommended he try over-the-counter Debrox.  Symptoms did not improve and we subsequently referred him to ENT.  Discussed the use of AI scribe software for clinical note transcription with the patient, who gave verbal consent to proceed.  History of Present Illness Daniel Brock is a 78 year old male who presents with right shoulder pain and earwax impaction.  He experienced a fall one to two months ago after tripping over a cat, resulting in significant pain in his right shoulder and tenderness in his elbow. The shoulder pain is exacerbated by pressure and movement, particularly at night when lying on it. He has been using Tylenol , which makes the pain tolerable, but he still experiences limited range of motion and discomfort. He reports tenderness in the elbow since the fall.  He has a history of earwax impaction, which was unsuccessfully treated with Debrox. He was  subsequently referred to an ENT, where the impaction was resolved using suction. He now uses earplugs in the shower to prevent water from entering his ears.  He mentions experiencing low blood pressure, feeling dizzy and lightheaded, and having difficulty with balance. He reports a previous blood pressure reading of 88/unknown at [facility] and a very low reading of 76/49 when checked by a nurse. He is concerned about the effects of low blood pressure on his kidneys, as he was previously taken off lisinopril  by his kidney doctor for similar reasons.  He does not routinely monitor his blood pressure at home.  He feels fine today though does intermittently get dizziness and lightheadedness.       Objective:  Physical Exam: BP 102/66   Pulse 71   Temp (!) 97.5 F (36.4 C) (Temporal)   Ht 5' 11 (1.803 m)   Wt 245 lb 9.6 oz (111.4 kg)   SpO2 97%   BMI 34.25 kg/m   Gen: No acute distress, resting comfortably CV: Regular rate and rhythm with no murmurs appreciated Pulm: Normal work of breathing, clear to auscultation bilaterally with no crackles, wheezes, or rhonchi MUSCULOSKELETAL: - Right Shoulder: No deformities.  Limited range of motion due to pain.  Pain elicited with resisted supraspinatus testing and internal rotation.  No pain with external rotation.  Positive Neer and Hawkins test.  Neurovascularly intact distally. Neuro: Grossly normal, moves all extremities Psych: Normal affect and thought content      Daniel Brock M. Kennyth, MD 10/30/2023 11:54 AM

## 2023-10-30 NOTE — Assessment & Plan Note (Addendum)
**Note De-Identified Man[place] Obfuscation** Blood pressure on low side and he has had several lows over the last few months with systolics as low as the 70s and 80s.  He is occasionally getting dizziness as well. We will discontinue his losartan .  He will monitor at home and follow-up with us  in a few weeks.

## 2023-11-02 ENCOUNTER — Ambulatory Visit (INDEPENDENT_AMBULATORY_CARE_PROVIDER_SITE_OTHER)

## 2023-11-02 VITALS — Ht 71.0 in | Wt 245.0 lb

## 2023-11-02 DIAGNOSIS — H35371 Puckering of macula, right eye: Secondary | ICD-10-CM | POA: Diagnosis not present

## 2023-11-02 DIAGNOSIS — H353124 Nonexudative age-related macular degeneration, left eye, advanced atrophic with subfoveal involvement: Secondary | ICD-10-CM | POA: Diagnosis not present

## 2023-11-02 DIAGNOSIS — H4423 Degenerative myopia, bilateral: Secondary | ICD-10-CM | POA: Diagnosis not present

## 2023-11-02 DIAGNOSIS — Z0000 Encounter for general adult medical examination without abnormal findings: Secondary | ICD-10-CM | POA: Diagnosis not present

## 2023-11-02 DIAGNOSIS — H353221 Exudative age-related macular degeneration, left eye, with active choroidal neovascularization: Secondary | ICD-10-CM | POA: Diagnosis not present

## 2023-11-02 DIAGNOSIS — H353114 Nonexudative age-related macular degeneration, right eye, advanced atrophic with subfoveal involvement: Secondary | ICD-10-CM | POA: Diagnosis not present

## 2023-11-02 NOTE — Progress Notes (Signed)
**Note De-Identified Chasen Obfuscation** Subjective:   Daniel Brock is a 78 y.o. who presents for a Medicare Wellness preventive visit.  As a reminder, Annual Wellness Visits don't include a physical exam, and some assessments may be limited, especially if this visit is performed virtually. We may recommend an in-person follow-up visit with your provider if needed.  Visit Complete: Virtual I connected with  Shammond Arave Medeiros on 11/02/23 by a audio enabled telemedicine application and verified that I am speaking with the correct person using two identifiers.  Patient Location: Home  Provider Location: Office/Clinic  I discussed the limitations of evaluation and management by telemedicine. The patient expressed understanding and agreed to proceed.  Vital Signs: Because this visit was a virtual/telehealth visit, some criteria may be missing or patient reported. Any vitals not documented were not able to be obtained and vitals that have been documented are patient reported.  VideoDeclined- This patient declined Librarian, academic. Therefore the visit was completed with audio only.  Persons Participating in Visit: Patient.  AWV Questionnaire: No: Patient Medicare AWV questionnaire was not completed prior to this visit.  Cardiac Risk Factors include: advanced age (>73men, >52 women);dyslipidemia;hypertension;male gender;obesity (BMI >30kg/m2)     Objective:    Today's Vitals   11/02/23 1119 11/02/23 1120  Weight: 245 lb (111.1 kg)   Height: 5' 11 (1.803 m)   PainSc:  3    Body mass index is 34.17 kg/m.     11/02/2023   11:23 AM 10/11/2022    2:38 PM 09/24/2021   12:20 PM 10/11/2017    1:25 PM 01/04/2017    9:01 AM 12/21/2016    1:07 PM 11/18/2015    1:18 PM  Advanced Directives  Does Patient Have a Medical Advance Directive? No No No No  No  No  No   Would patient like information on creating a medical advance directive? No - Patient declined No - Patient declined No - Patient declined          Data saved with a previous flowsheet row definition    Current Medications (verified) Outpatient Encounter Medications as of 11/02/2023  Medication Sig   alfuzosin  (UROXATRAL ) 10 MG 24 hr tablet Take 1 tablet (10 mg total) by mouth daily with breakfast.   Cholecalciferol (VITAMIN D3 PO) Take 2,000 Units by mouth daily.   citalopram  (CELEXA ) 40 MG tablet Take 1 tablet (40 mg total) by mouth daily.   colchicine  0.6 MG tablet Take 1 tablet (0.6 mg total) by mouth daily as needed.   ezetimibe  (ZETIA ) 10 MG tablet Take 1 tablet (10 mg total) by mouth daily.   HYDROcodone -acetaminophen  (NORCO/VICODIN) 5-325 MG tablet Take 1 tablet by mouth every 6 (six) hours as needed for moderate pain (pain score 4-6).   KRILL OIL PO Take by mouth.   rosuvastatin  (CRESTOR ) 20 MG tablet Take 1 tablet (20 mg total) by mouth daily.   tiZANidine  (ZANAFLEX ) 4 MG tablet Take 1 tablet (4 mg total) by mouth every 6 (six) hours as needed for muscle spasms.   Vitamin D , Ergocalciferol , (DRISDOL ) 1.25 MG (50000 UNIT) CAPS capsule Take 1 capsule (50,000 Units total) by mouth every 7 (seven) days.   chlorhexidine (PERIDEX) 0.12 % solution 2 (two) times daily. (Patient not taking: Reported on 11/02/2023)   No facility-administered encounter medications on file as of 11/02/2023.    Allergies (verified) Ciprofloxacin , Ciprofloxacin  hcl, Sulfamethoxazole-trimethoprim, Sulfonamide derivatives, and Sulphur [elemental sulfur]   History: Past Medical History:  Diagnosis Date   Allergy **Note De-Identified Glantz Obfuscation** Anxiety    Cataract    Chronic kidney disease    Depression 2016   Gout    Hiatal hernia    Hyperlipidemia    Hypertension    Renal insufficiency    Varicose veins    Past Surgical History:  Procedure Laterality Date   CARPAL TUNNEL RELEASE Left    CARPAL TUNNEL RELEASE Bilateral    CHOLECYSTECTOMY  2026   COLONOSCOPY     COLONOSCOPY W/ POLYPECTOMY     EYE SURGERY Bilateral 1958, 2004   cataracts   INGUINAL HERNIA REPAIR      JOINT REPLACEMENT  2002   TOTAL KNEE ARTHROPLASTY Right 2002   ulnar intrapment release  07/2008   VARICOSE VEIN SURGERY Left 1980's per patient   Family History  Problem Relation Age of Onset   Hypertension Mother    Hyperlipidemia Mother    Arthritis Mother    Heart disease Mother 68       CABG   Huntington's disease Mother    Cancer Father        pancreatic cancer   Arthritis Father    Diabetes Father    Hypertension Father    Hyperlipidemia Sister    Hypertension Sister    Hypertension Brother    Vision loss Brother    Huntington's disease Brother    Heart disease Maternal Grandmother    Early death Maternal Grandfather    Colon cancer Neg Hx    Esophageal cancer Neg Hx    Rectal cancer Neg Hx    Stomach cancer Neg Hx    Social History   Socioeconomic History   Marital status: Married    Spouse name: Inocente   Number of children: Not on file   Years of education: Not on file   Highest education level: Not on file  Occupational History   Occupation: retired  Tobacco Use   Smoking status: Never   Smokeless tobacco: Never  Vaping Use   Vaping status: Never Used  Substance and Sexual Activity   Alcohol use: No   Drug use: No   Sexual activity: Never  Other Topics Concern   Not on file  Social History Narrative   Not on file   Social Drivers of Health   Financial Resource Strain: Low Risk  (11/02/2023)   Overall Financial Resource Strain (CARDIA)    Difficulty of Paying Living Expenses: Not hard at all  Food Insecurity: No Food Insecurity (11/02/2023)   Hunger Vital Sign    Worried About Running Out of Food in the Last Year: Never true    Ran Out of Food in the Last Year: Never true  Transportation Needs: No Transportation Needs (11/02/2023)   PRAPARE - Administrator, Civil Service (Medical): No    Lack of Transportation (Non-Medical): No  Physical Activity: Inactive (11/02/2023)   Exercise Vital Sign    Days of Exercise per Week: 0 days     Minutes of Exercise per Session: 0 min  Stress: No Stress Concern Present (11/02/2023)   [employer] of Occupational Health - Occupational Stress Questionnaire    Feeling of Stress: Not at all  Social Connections: Moderately Isolated (11/02/2023)   Social Connection and Isolation Panel    Frequency of Communication with Friends and Family: Once a week    Frequency of Social Gatherings with Friends and Family: More than three times a week    Attends Religious Services: Never    Database administrator or Organizations: **Note De-Identified Farve Obfuscation** No    Attends Banker Meetings: Never    Marital Status: Married    Tobacco Counseling Counseling given: Not Answered    Clinical Intake:  Pre-visit preparation completed: Yes  Pain : 0-10 Pain Score: 3  Pain Type: Chronic pain Pain Location: Shoulder Pain Orientation: Right Pain Descriptors / Indicators: Sore Pain Onset: More than a month ago Pain Frequency: Intermittent     BMI - recorded: 34.17 Nutritional Status: BMI > 30  Obese Nutritional Risks: None Diabetes: No  Lab Results  Component Value Date   HGBA1C 6.3 (H) 05/09/2022   HGBA1C 5.6 11/05/2021   HGBA1C 5.6 10/30/2020     How often do you need to have someone help you when you read instructions, pamphlets, or other written materials from your doctor or pharmacy?: 1 - Never  Interpreter Needed?: No  Information entered by :: Ellouise Haws, LPN   Activities of Daily Living     11/02/2023   11:21 AM  In your present state of health, do you have any difficulty performing the following activities:  Hearing? 0  Vision? 0  Difficulty concentrating or making decisions? 0  Walking or climbing stairs? 0  Dressing or bathing? 0  Doing errands, shopping? 0  Preparing Food and eating ? N  Using the Toilet? N  In the past six months, have you accidently leaked urine? Y  Comment at times  Do you have problems with loss of bowel control? N  Managing your Medications? N   Managing your Finances? N  Housekeeping or managing your Housekeeping? N    Patient Care Team: Kennyth Worth HERO, MD as PCP - General (Family Medicine) Charls Pearla LABOR, MD (Inactive) as PCP - Cardiology (Cardiology) Bonner Garnette ORN, PA (Orthopedic Surgery) Rachele Gaynell RAMAN, MD as Consulting Physician (Nephrology) Elner Arley LABOR, MD as Consulting Physician (Ophthalmology)  I have updated your Care Teams any recent Medical Services you may have received from other providers in the past year.     Assessment:   This is a routine wellness examination for [place].  Hearing/Vision screen Hearing Screening - Comments:: Pt denies any hearing issues  Vision Screening - Comments:: Wears rx glasses - up to date with routine eye exams with Dr Arley Elner    Goals Addressed             This Visit's Progress    Patient Stated       Maintain health and activity        Depression Screen     11/02/2023   11:23 AM 10/30/2023   11:26 AM 02/23/2023    2:22 PM 10/11/2022    2:37 PM 09/20/2022   12:56 PM 08/31/2022    2:24 PM 08/09/2022    3:30 PM  PHQ 2/9 Scores  PHQ - 2 Score 0 0 0 0 0 0 0  PHQ- 9 Score    0 0 3 0    Fall Risk     11/02/2023   11:25 AM 10/30/2023   11:27 AM 02/23/2023    2:24 PM 10/11/2022    2:38 PM 09/20/2022   12:57 PM  Fall Risk   Falls in the past year? 1 1 0 1 0  Number falls in past yr: 1 1 0 1 0  Comment    tripped over cat   Injury with Fall? 1 1 0 0 0  Risk for fall due to : History of fall(s) No Fall Risks No Fall Risks Impaired balance/gait **Note De-Identified Presley Obfuscation** No Fall Risks  Follow up Falls prevention discussed   Falls prevention discussed     MEDICARE RISK AT HOME:  Medicare Risk at Home Any stairs in or around the home?: Yes If so, are there any without handrails?: No Home free of loose throw rugs in walkways, pet beds, electrical cords, etc?: Yes Adequate lighting in your home to reduce risk of falls?: Yes Life alert?: No Use of a cane, walker or w/c?: No Grab  bars in the bathroom?: Yes Shower chair or bench in shower?: No Elevated toilet seat or a handicapped toilet?: No  TIMED UP AND GO:  Was the test performed?  No  Cognitive Function: 6CIT completed        11/02/2023   11:26 AM 10/11/2022    2:39 PM 09/24/2021   12:16 PM  6CIT Screen  What Year? 0 points 0 points 0 points  What month? 0 points 0 points 0 points  What time? 0 points 0 points 0 points  Count back from 20 0 points 0 points 0 points  Months in reverse 0 points 0 points 0 points  Repeat phrase 0 points 0 points 2 points  Total Score 0 points 0 points 2 points    Immunizations Immunization History  Administered Date(s) Administered   Fluad Quad(high Dose 65+) 01/03/2019, 04/30/2021, 05/09/2022   Fluad Trivalent(High Dose 65+) 04/27/2023   HPV Quadrivalent 10/22/2012   Hepatitis B 03/21/1980   Influenza, High Dose Seasonal PF 03/22/2017, 02/21/2018   Influenza-Unspecified 01/22/2020   PFIZER(Purple Top)SARS-COV-2 Vaccination 05/19/2019, 06/18/2019, 02/05/2020   Pneumococcal Conjugate-13 03/07/2016   Pneumococcal Polysaccharide-23 02/18/2013, 08/30/2013   Td 02/18/2005, 03/04/2009   Zoster Recombinant(Shingrix ) 05/09/2022   Zoster, Live 03/10/2015    Screening Tests Health Maintenance  Topic Date Due   Zoster Vaccines- Shingrix  (2 of 2) 11/23/2023 (Originally 07/04/2022)   INFLUENZA VACCINE  06/18/2024 (Originally 10/20/2023)   DTaP/Tdap/Td (3 - Tdap) 08/22/2024 (Originally 03/05/2019)   Medicare Annual Wellness (AWV)  11/01/2024   Pneumococcal Vaccine: 50+ Years  Completed   Hepatitis C Screening  Completed   HPV VACCINES  Aged Out   Meningococcal B Vaccine  Aged Out   Hepatitis B Vaccines 19-59 Average Risk  Discontinued   Colonoscopy  Discontinued   COVID-19 Vaccine  Discontinued    Health Maintenance  There are no preventive care reminders to display for this patient.  Health Maintenance Items Addressed: See Nurse Notes at the end of this  note  Additional Screening:  Vision Screening: Recommended annual ophthalmology exams for early detection of glaucoma and other disorders of the eye. Would you like a referral to an eye doctor? No    Dental Screening: Recommended annual dental exams for proper oral hygiene  Community Resource Referral / Chronic Care Management: CRR required this visit?  No   CCM required this visit?  No   Plan:    I have personally reviewed and noted the following in the patient's chart:   Medical and social history Use of alcohol, tobacco or illicit drugs  Current medications and supplements including opioid prescriptions. Patient is currently taking opioid prescriptions. Information provided to patient regarding non-opioid alternatives. Patient advised to discuss non-opioid treatment plan with their provider. Functional ability and status Nutritional status Physical activity Advanced directives List of other physicians Hospitalizations, surgeries, and ER visits in previous 12 months Vitals Screenings to include cognitive, depression, and falls Referrals and appointments  In addition, I have reviewed and discussed with patient certain preventive protocols, quality metrics, **Note De-Identified Gellatly Obfuscation** and best practice recommendations. A written personalized care plan for preventive services as well as general preventive health recommendations were provided to patient.   Ellouise VEAR Haws, LPN   1/85/[id number]   After Visit Summary: (MyChart) Due to this being a telephonic visit, the after visit summary with patients personalized plan was offered to patient [facility] MyChart   Notes: Nothing significant to report at this time.

## 2023-11-02 NOTE — Patient Instructions (Signed)
**Note De-Identified K[place]elka Obfuscation** Mr. Daniel Brock , Thank you for taking time out of your busy schedule to complete your Annual Wellness Visit with me. I enjoyed our conversation and look forward to speaking with you again next year. I, as well as your care team,  appreciate your ongoing commitment to your health goals. Please review the following plan we discussed and let me know if I can assist you in the future. Your Game plan/ To Do List    Referrals: If you haven't heard from the office you've been referred to, please reach out to them at the phone provided.   Follow up Visits: We will see or speak with you next year for your Next Medicare AWV with our clinical staff Have you seen your provider in the last 6 months (3 months if uncontrolled diabetes)? Yes  Clinician Recommendations:  Each day, aim for 6 glasses of water, plenty of protein in your diet and try to get up and walk/ stretch every hour for 5-10 minutes at a time.        This is a list of the screenings recommended for you:  Health Maintenance  Topic Date Due   Medicare Annual Wellness Visit  10/11/2023   Zoster (Shingles) Vaccine (2 of 2) 11/23/2023*   Flu Shot  06/18/2024*   DTaP/Tdap/Td vaccine (3 - Tdap) 08/22/2024*   Pneumococcal Vaccine for age over 16  Completed   Hepatitis C Screening  Completed   HPV Vaccine  Aged Out   Meningitis B Vaccine  Aged Out   Hepatitis B Vaccine  Discontinued   Colon Cancer Screening  Discontinued   COVID-19 Vaccine  Discontinued  *Topic was postponed. The date shown is not the original due date.    Advanced directives: (Declined) Advance directive discussed with you today. Even though you declined this today, please call our office should you change your mind, and we can give you the proper paperwork for you to fill out. Advance Care Planning is important because it:  [x]  Makes sure you receive the medical care that is consistent with your values, goals, and preferences  [x]  It provides guidance to your family and loved  ones and reduces their decisional burden about whether or not they are making the right decisions based on your wishes.  Follow the link provided in your after visit summary or read over the paperwork we have mailed to you to help you started getting your Advance Directives in place. If you need assistance in completing these, please reach out to us  so that we can help you!  See attachments for Preventive Care and Fall Prevention Tips.

## 2023-11-07 ENCOUNTER — Ambulatory Visit: Attending: Family Medicine | Admitting: Physical Therapy

## 2023-11-07 ENCOUNTER — Other Ambulatory Visit: Payer: Self-pay

## 2023-11-07 ENCOUNTER — Encounter: Payer: Self-pay | Admitting: Physical Therapy

## 2023-11-07 DIAGNOSIS — M6281 Muscle weakness (generalized): Secondary | ICD-10-CM | POA: Insufficient documentation

## 2023-11-07 DIAGNOSIS — M25511 Pain in right shoulder: Secondary | ICD-10-CM | POA: Diagnosis not present

## 2023-11-07 DIAGNOSIS — M25611 Stiffness of right shoulder, not elsewhere classified: Secondary | ICD-10-CM | POA: Diagnosis not present

## 2023-11-07 NOTE — Therapy (Signed)
**Note De-Identified [place] Obfuscation** OUTPATIENT PHYSICAL THERAPY SHOULDER EVALUATION   Patient Name: Daniel Brock MRN: [id number] DOB:1946/01/26, 78 y.o., male Today's Date: 11/07/2023  END OF SESSION:  PT End of Session - 11/07/23 1227     Visit Number 1    Number of Visits 12    Date for PT Re-Evaluation 12/19/23    PT Start Time 1100    PT Stop Time 1150    PT Time Calculation (min) 50 min    Activity Tolerance Patient tolerated treatment well    Behavior During Therapy [facility] for tasks assessed/performed          Past Medical History:  Diagnosis Date   Allergy    Anxiety    Cataract    Chronic kidney disease    Depression 2016   Gout    Hiatal hernia    Hyperlipidemia    Hypertension    Renal insufficiency    Varicose veins    Past Surgical History:  Procedure Laterality Date   CARPAL TUNNEL RELEASE Left    CARPAL TUNNEL RELEASE Bilateral    CHOLECYSTECTOMY  2026   COLONOSCOPY     COLONOSCOPY W/ POLYPECTOMY     EYE SURGERY Bilateral 1958, 2004   cataracts   INGUINAL HERNIA REPAIR     JOINT REPLACEMENT  2002   TOTAL KNEE ARTHROPLASTY Right 2002   ulnar intrapment release  07/2008   VARICOSE VEIN SURGERY Left 1980's per patient   Patient Active Problem List   Diagnosis Date Noted   B12 deficiency 02/23/2023   Vitamin D  deficiency 02/23/2023   Thrombocytopenia (HCC) 09/20/2022   Neuropathy 09/20/2022   Chronic back pain 09/20/2022   Dyslipidemia 09/20/2022   Cognitive impairment 09/20/2022   Tinnitus 09/20/2022   CKD (chronic kidney disease), stage III (HCC) 05/04/2020   Advanced nonexudative age-related macular degeneration of right eye with subfoveal involvement 01/27/2020   Advanced nonexudative age-related macular degeneration of left eye without subfoveal involvement 01/27/2020   Myopic macular degeneration of both eyes 01/27/2020   Exudative age-related macular degeneration of left eye with active choroidal neovascularization (HCC) 01/27/2020   Generalized anxiety disorder  03/07/2016   History of colon polyps 02/17/2010   BPH (benign prostatic hyperplasia) 06/03/2008   Gout 08/03/2007   Essential hypertension 03/02/2007   REFERRING PROVIDER: Worth Kitty MD  REFERRING DIAG: Acute pain of right shoulder.    THERAPY DIAG:  Acute pain of right shoulder  Stiffness of right shoulder, not elsewhere classified  Muscle weakness (generalized)  Rationale for Evaluation and Treatment: Rehabilitation  ONSET DATE: Couple months ago.  SUBJECTIVE: **Note De-Identified [place] Obfuscation** SUBJECTIVE STATEMENT: The patient presents to the clinic with c/o right shoulder pain after tripping over a cat. He states he tried to prevent himself from falling and his right shoulder pulled backward.  His pain is rated at a 3-4/10 which he states that it is better since the injury.  He describes the pain as an ache.  He is unable to sleep on his right shoulder due to pain.  He occasionally experiences tingling into his right UE.    PERTINENT HISTORY: See above.    PAIN:  Are you having pain? Yes: NPRS scale: 3-4/10.   Pain location: Right shoulder. Pain description: Ache. Aggravating factors: Certain movements.   Relieving factors: Pain medication.    PRECAUTIONS: None  RED FLAGS: None   WEIGHT BEARING RESTRICTIONS: No  FALLS:  Has patient fallen in last 6 months? Yes. Number of falls 2.  Trips.    LIVING ENVIRONMENT: Lives with: lives with their spouse Lives in: House/apartment Has following equipment at home: None  OCCUPATION: Retired.    PLOF: Independent  PATIENT GOALS:Use right shoulder without pain.    NEXT MD VISIT:   OBJECTIVE:   PATIENT SURVEYS:  Quick DASH: 56.82  POSTURE: Rounded shoulders.    UPPER EXTREMITY ROM:   Active antigravity flexion is 140 degrees (same on left), ER is 60 degrees and  normal behind back motion.    UPPER EXTREMITY MMT: With elbow by side IR/ER strength to 4 to 4+/5.  Abduction graded grossly at 3+ to 4-/5.    SHOULDER SPECIAL TESTS: (-) right Drop Arm test.  PALPATION:  Tender to palpation over right bicipital groove.                                                                                                                               TREATMENT DATE: IFC at 80-150 Hz on 40% scan x 20 minutes to patient's right shoulder f/b combo e'stim/US  to right anterior shoulder at 1.50 W/CM2 x 8 minutes. Normal modality response following removal of modality.   PATIENT EDUCATION: Education details:  Person educated:  International aid/development worker:  Education comprehension:   HOME EXERCISE PROGRAM:   ASSESSMENT:  CLINICAL IMPRESSION: The patient presents to OPPT with c/o right shoulder pain due to a fall when he tripped over a cat.  He lacks some external rotation motion per contralateral comparison.  He is TTP over his right bicipital groove.  He demonstrates a negative Drop Arm test.  His [place] DASH score is 56.82.  Patient will benefit from skilled PT intervention to address pain and deficits.  OBJECTIVE IMPAIRMENTS: decreased activity tolerance, decreased ROM, decreased strength, and pain.   ACTIVITY LIMITATIONS: carrying and lifting  PARTICIPATION LIMITATIONS: meal prep, cleaning, and laundry  REHAB POTENTIAL: Good  CLINICAL DECISION MAKING: Stable/uncomplicated  EVALUATION COMPLEXITY: Low   GOALS:  SHORT TERM GOALS: Target date: 11/21/23  Ind with an initial HEP. Goal status: INITIAL   LONG TERM GOALS: Target **Note De-Identified [place] Obfuscation** date: 12/19/23  Ind with an advanced HEP.  Goal status: INITIAL  2.  Improve right shoulder ER to 70 degrees+ to make donning and doffing apparel easier.  Goal status: INITIAL  3.  Patient able to sleep on right side.  Goal status: INITIAL  4.  Perform ADL's with pain not > 2-3/10.  Goal status: INITIAL  5.  Improve Quick DASH  score by 20%.  Goal status: INITIAL  PLAN:  PT FREQUENCY: 2x/week  PT DURATION: 6 weeks  PLANNED INTERVENTIONS: 97110-Therapeutic exercises, 97530- Therapeutic activity, [id number]- Neuromuscular re-education, 97535- Self Care, 02859- Manual therapy, G0283- Electrical stimulation (unattended), 97035- Ultrasound, 02966- Ionotophoresis 4mg /ml Dexamethasone, Patient/Family education, Cryotherapy, and Moist heat  PLAN FOR NEXT SESSION: Combo e'stim/US , STW/M, UE ranger, right shoulder strengthening.     Tyquez Hollibaugh, [place], PT 11/07/2023, 12:50 PM

## 2023-11-09 ENCOUNTER — Ambulatory Visit: Payer: Self-pay | Admitting: Family Medicine

## 2023-11-09 NOTE — Progress Notes (Signed)
**Note De-Identified [place] Obfuscation** X-ray shows arthritis in his shoulder.  Recommend referral to PT or sports medicine if he is still having significant issues.

## 2023-11-13 ENCOUNTER — Encounter: Payer: Self-pay | Admitting: *Deleted

## 2023-11-13 ENCOUNTER — Ambulatory Visit: Admitting: *Deleted

## 2023-11-13 DIAGNOSIS — M25511 Pain in right shoulder: Secondary | ICD-10-CM | POA: Diagnosis not present

## 2023-11-13 DIAGNOSIS — M25611 Stiffness of right shoulder, not elsewhere classified: Secondary | ICD-10-CM

## 2023-11-13 DIAGNOSIS — M6281 Muscle weakness (generalized): Secondary | ICD-10-CM

## 2023-11-13 NOTE — Therapy (Signed)
**Note De-Identified [place] Obfuscation** OUTPATIENT PHYSICAL THERAPY SHOULDER EVALUATION   Patient Name: Daniel Brock MRN: [id number] DOB:[birth year], 78 y.o., male Today's Date: 11/13/2023  END OF SESSION:  PT End of Session - 11/13/23 1102     Visit Number 2    Number of Visits 12    Date for PT Re-Evaluation 12/19/23    PT Start Time 1103    PT Stop Time 1156    PT Time Calculation (min) 53 min          Past Medical History:  Diagnosis Date   Allergy    Anxiety    Cataract    Chronic kidney disease    Depression 2016   Gout    Hiatal hernia    Hyperlipidemia    Hypertension    Renal insufficiency    Varicose veins    Past Surgical History:  Procedure Laterality Date   CARPAL TUNNEL RELEASE Left    CARPAL TUNNEL RELEASE Bilateral    CHOLECYSTECTOMY  2026   COLONOSCOPY     COLONOSCOPY W/ POLYPECTOMY     EYE SURGERY Bilateral 1958, 2004   cataracts   INGUINAL HERNIA REPAIR     JOINT REPLACEMENT  2002   TOTAL KNEE ARTHROPLASTY Right 2002   ulnar intrapment release  07/2008   VARICOSE VEIN SURGERY Left 1980's per patient   Patient Active Problem List   Diagnosis Date Noted   B12 deficiency 02/23/2023   Vitamin D  deficiency 02/23/2023   Thrombocytopenia (HCC) 09/20/2022   Neuropathy 09/20/2022   Chronic back pain 09/20/2022   Dyslipidemia 09/20/2022   Cognitive impairment 09/20/2022   Tinnitus 09/20/2022   CKD (chronic kidney disease), stage III (HCC) 05/04/2020   Advanced nonexudative age-related macular degeneration of right eye with subfoveal involvement 01/27/2020   Advanced nonexudative age-related macular degeneration of left eye without subfoveal involvement 01/27/2020   Myopic macular degeneration of both eyes 01/27/2020   Exudative age-related macular degeneration of left eye with active choroidal neovascularization (HCC) 01/27/2020   Generalized anxiety disorder 03/07/2016   History of colon polyps 02/17/2010   BPH (benign prostatic hyperplasia) 06/03/2008   Gout 08/03/2007    Essential hypertension 03/02/2007   REFERRING PROVIDER: Worth Kitty MD  REFERRING DIAG: Acute pain of right shoulder.    THERAPY DIAG:  Acute pain of right shoulder  Stiffness of right shoulder, not elsewhere classified  Muscle weakness (generalized)  Rationale for Evaluation and Treatment: Rehabilitation  ONSET DATE: Couple months ago.  SUBJECTIVE: **Note De-Identified [place] Obfuscation** SUBJECTIVE STATEMENT: The patient presents to the clinic with c/o right shoulder pain after tripping over a cat. He states his DR called him and told him he has arthritis in his shldr PERTINENT HISTORY: See above.    PAIN:  Are you having pain? Yes: NPRS scale: 3-4/10.   Pain location: Right shoulder. Pain description: Ache. Aggravating factors: Certain movements.   Relieving factors: Pain medication.    PRECAUTIONS: None  RED FLAGS: None   WEIGHT BEARING RESTRICTIONS: No  FALLS:  Has patient fallen in last 6 months? Yes. Number of falls 2.  Trips.    LIVING ENVIRONMENT: Lives with: lives with their spouse Lives in: House/apartment Has following equipment at home: None  OCCUPATION: Retired.    PLOF: Independent  PATIENT GOALS:Use right shoulder without pain.    NEXT MD VISIT:   OBJECTIVE:   PATIENT SURVEYS:  Quick DASH: 56.82  POSTURE: Rounded shoulders.    UPPER EXTREMITY ROM:   Active antigravity flexion is 140 degrees (same on left), ER is 60 degrees and normal behind back motion.    UPPER EXTREMITY MMT: With elbow by side IR/ER strength to 4 to 4+/5.  Abduction graded grossly at 3+ to 4-/5.    SHOULDER SPECIAL TESTS: (-) right Drop Arm test.  PALPATION:  Tender to palpation over right bicipital groove.                                                                                                                                TREATMENT DATE: 11-13-23                                   EXERCISE LOG   RT shldr  Exercise Repetitions and Resistance Comments  UBE 90 RPMs x 6 mins    RW 4 Red Tband 2x15 each   Shldr extension Red  tband 2x15             Blank cell = exercise not performed today   IFC at 80-150 Hz on 40% scan x 15 minutes to patient's right shoulder    Normal modality response following removal of modality.   PATIENT EDUCATION: Education details:  Person educated:  International aid/development worker:  Education comprehension:   HOME EXERCISE PROGRAM:   ASSESSMENT:  CLINICAL IMPRESSION: The patient presents to OPPT with c/o right shoulder pain due to a fall when he tripped over a cat. Rx focused on therex for RT shldr and did well with mainly soreness all around. IFC end of session tolerated well.      OBJECTIVE IMPAIRMENTS: decreased activity tolerance, decreased ROM, decreased strength, and pain.   ACTIVITY LIMITATIONS: carrying and lifting  PARTICIPATION LIMITATIONS: meal prep, cleaning, and laundry  REHAB POTENTIAL: Good  CLINICAL DECISION MAKING: Stable/uncomplicated  EVALUATION COMPLEXITY: Low   GOALS:  SHORT TERM GOALS: Target date: 11/21/23  Ind with an initial HEP. **Note De-Identified [place] Obfuscation** Goal status: INITIAL   LONG TERM GOALS: Target date: 12/19/23  Ind with an advanced HEP.  Goal status: INITIAL  2.  Improve right shoulder ER to 70 degrees+ to make donning and doffing apparel easier.  Goal status: INITIAL  3.  Patient able to sleep on right side.  Goal status: INITIAL  4.  Perform ADL's with pain not > 2-3/10.  Goal status: INITIAL  5.  Improve Quick DASH score by 20%.  Goal status: INITIAL  PLAN:  PT FREQUENCY: 2x/week  PT DURATION: 6 weeks  PLANNED INTERVENTIONS: 97110-Therapeutic exercises, 97530- Therapeutic activity, [id number]- Neuromuscular re-education, 97535- Self Care, 02859- Manual therapy, G0283- Electrical stimulation (unattended), 97035- Ultrasound, 02966-  Ionotophoresis 4mg /ml Dexamethasone, Patient/Family education, Cryotherapy, and Moist heat  PLAN FOR NEXT SESSION: Combo e'stim/US , STW/M, UE ranger, right shoulder strengthening.     April Carlyon,CHRIS, PTA 11/13/2023, 11:56 AM

## 2023-11-15 ENCOUNTER — Encounter

## 2023-11-16 ENCOUNTER — Ambulatory Visit: Admitting: Physical Therapy

## 2023-11-16 DIAGNOSIS — M25511 Pain in right shoulder: Secondary | ICD-10-CM | POA: Diagnosis not present

## 2023-11-16 DIAGNOSIS — M25611 Stiffness of right shoulder, not elsewhere classified: Secondary | ICD-10-CM

## 2023-11-16 DIAGNOSIS — M6281 Muscle weakness (generalized): Secondary | ICD-10-CM

## 2023-11-16 NOTE — Therapy (Signed)
**Note De-Identified [place] Obfuscation** OUTPATIENT PHYSICAL THERAPY SHOULDER EVALUATION   Patient Name: Daniel Brock MRN: [id number] DOB:April 02, 1945, 78 y.o., male Today's Date: 11/16/2023  END OF SESSION:  PT End of Session - 11/16/23 1729     Visit Number 3    Number of Visits 12    Date for PT Re-Evaluation 12/19/23    PT Start Time 0443    Activity Tolerance Patient tolerated treatment well    Behavior During Therapy [facility] for tasks assessed/performed           Past Medical History:  Diagnosis Date   Allergy    Anxiety    Cataract    Chronic kidney disease    Depression 2016   Gout    Hiatal hernia    Hyperlipidemia    Hypertension    Renal insufficiency    Varicose veins    Past Surgical History:  Procedure Laterality Date   CARPAL TUNNEL RELEASE Left    CARPAL TUNNEL RELEASE Bilateral    CHOLECYSTECTOMY  2026   COLONOSCOPY     COLONOSCOPY W/ POLYPECTOMY     EYE SURGERY Bilateral 1958, 2004   cataracts   INGUINAL HERNIA REPAIR     JOINT REPLACEMENT  2002   TOTAL KNEE ARTHROPLASTY Right 2002   ulnar intrapment release  07/2008   VARICOSE VEIN SURGERY Left 1980's per patient   Patient Active Problem List   Diagnosis Date Noted   B12 deficiency 02/23/2023   Vitamin D  deficiency 02/23/2023   Thrombocytopenia (HCC) 09/20/2022   Neuropathy 09/20/2022   Chronic back pain 09/20/2022   Dyslipidemia 09/20/2022   Cognitive impairment 09/20/2022   Tinnitus 09/20/2022   CKD (chronic kidney disease), stage III (HCC) 05/04/2020   Advanced nonexudative age-related macular degeneration of right eye with subfoveal involvement 01/27/2020   Advanced nonexudative age-related macular degeneration of left eye without subfoveal involvement 01/27/2020   Myopic macular degeneration of both eyes 01/27/2020   Exudative age-related macular degeneration of left eye with active choroidal neovascularization (HCC) 01/27/2020   Generalized anxiety disorder 03/07/2016   History of colon polyps 02/17/2010   BPH (benign  prostatic hyperplasia) 06/03/2008   Gout 08/03/2007   Essential hypertension 03/02/2007   REFERRING PROVIDER: Worth Kitty MD  REFERRING DIAG: Acute pain of right shoulder.    THERAPY DIAG:  Acute pain of right shoulder  Stiffness of right shoulder, not elsewhere classified  Muscle weakness (generalized)  Rationale for Evaluation and Treatment: Rehabilitation  ONSET DATE: Couple months ago.  SUBJECTIVE: **Note De-Identified [place] Obfuscation** SUBJECTIVE STATEMENT: No new complaints.  PERTINENT HISTORY: See above.    PAIN:  Are you having pain? Yes: NPRS scale: 3-4/10.   Pain location: Right shoulder. Pain description: Ache. Aggravating factors: Certain movements.   Relieving factors: Pain medication.    PRECAUTIONS: None  RED FLAGS: None   WEIGHT BEARING RESTRICTIONS: No  FALLS:  Has patient fallen in last 6 months? Yes. Number of falls 2.  Trips.    LIVING ENVIRONMENT: Lives with: lives with their spouse Lives in: House/apartment Has following equipment at home: None  OCCUPATION: Retired.    PLOF: Independent  PATIENT GOALS:Use right shoulder without pain.    NEXT MD VISIT:   OBJECTIVE:   PATIENT SURVEYS:  Quick DASH: 56.82  POSTURE: Rounded shoulders.    UPPER EXTREMITY ROM:   Active antigravity flexion is 140 degrees (same on left), ER is 60 degrees and normal behind back motion.    UPPER EXTREMITY MMT: With elbow by side IR/ER strength to 4 to 4+/5.  Abduction graded grossly at 3+ to 4-/5.    SHOULDER SPECIAL TESTS: (-) right Drop Arm test.  PALPATION:  Tender to palpation over right bicipital groove.                                                                                                                               TREATMENT DATE:   11/16/23:  UBE x 8 minutes f/b combo e'stim/US  at  1.50 W/CM2 x 8 minutes to patient's right anterior shoulder f/b STW/M x 7 minutes f/b IFC at 80-150 Hz on 40% scan x 20 minutes.   11-13-23                                   EXERCISE LOG   RT shldr  Exercise Repetitions and Resistance Comments  UBE 90 RPMs x 6 mins    RW 4 Red Tband 2x15 each   Shldr extension Red  tband 2x15             Blank cell = exercise not performed today   IFC at 80-150 Hz on 40% scan x 15 minutes to patient's right shoulder    Normal modality response following removal of modality.   PATIENT EDUCATION: Education details:  Person educated:  International aid/development worker:  Education comprehension:   HOME EXERCISE PROGRAM:   ASSESSMENT:  CLINICAL IMPRESSION: Patient did very well with treatment and tolerated without complaint.  Treatment focused on right bicipital tendon region.   Normal modality response following removal of modality.  OBJECTIVE IMPAIRMENTS: decreased activity tolerance, decreased ROM, decreased strength, and pain.   ACTIVITY LIMITATIONS: carrying and lifting  PARTICIPATION LIMITATIONS: meal prep, cleaning, and laundry  REHAB POTENTIAL: Good  CLINICAL DECISION MAKING: Stable/uncomplicated  EVALUATION COMPLEXITY: Low   GOALS:  SHORT TERM GOALS: Target date: 11/21/23  Ind with an initial HEP. Goal status: INITIAL **Note De-Identified [place] Obfuscation** LONG TERM GOALS: Target date: 12/19/23  Ind with an advanced HEP.  Goal status: INITIAL  2.  Improve right shoulder ER to 70 degrees+ to make donning and doffing apparel easier.  Goal status: INITIAL  3.  Patient able to sleep on right side.  Goal status: INITIAL  4.  Perform ADL's with pain not > 2-3/10.  Goal status: INITIAL  5.  Improve Quick DASH score by 20%.  Goal status: INITIAL  PLAN:  PT FREQUENCY: 2x/week  PT DURATION: 6 weeks  PLANNED INTERVENTIONS: 97110-Therapeutic exercises, 97530- Therapeutic activity, [id number]- Neuromuscular re-education, 97535- Self Care, 02859- Manual therapy, G0283- Electrical  stimulation (unattended), 97035- Ultrasound, 02966- Ionotophoresis 4mg /ml Dexamethasone, Patient/Family education, Cryotherapy, and Moist heat  PLAN FOR NEXT SESSION: Combo e'stim/US , STW/M, UE ranger, right shoulder strengthening.     Oswin Johal, [place], PT 11/16/2023, 5:30 PM

## 2023-11-21 ENCOUNTER — Encounter

## 2023-11-23 ENCOUNTER — Ambulatory Visit: Attending: Family Medicine | Admitting: *Deleted

## 2023-11-23 ENCOUNTER — Encounter: Payer: Self-pay | Admitting: *Deleted

## 2023-11-23 DIAGNOSIS — M25511 Pain in right shoulder: Secondary | ICD-10-CM | POA: Diagnosis not present

## 2023-11-23 DIAGNOSIS — M6281 Muscle weakness (generalized): Secondary | ICD-10-CM | POA: Insufficient documentation

## 2023-11-23 DIAGNOSIS — M25611 Stiffness of right shoulder, not elsewhere classified: Secondary | ICD-10-CM | POA: Diagnosis not present

## 2023-11-23 NOTE — Therapy (Signed)
**Note De-Identified [facility]ation** OUTPATIENT PHYSICAL THERAPY SHOULDER EVALUATION   Patient Name: Daniel Brock MRN: [id number] DOB:Jan 19, 1946, 78 y.o., male Today's Date: 11/23/2023  END OF SESSION:  PT End of Session - 11/23/23 1728     Visit Number 4    Number of Visits 12    Date for PT Re-Evaluation 12/19/23    PT Start Time 1645           Past Medical History:  Diagnosis Date   Allergy    Anxiety    Cataract    Chronic kidney disease    Depression 2016   Gout    Hiatal hernia    Hyperlipidemia    Hypertension    Renal insufficiency    Varicose veins    Past Surgical History:  Procedure Laterality Date   CARPAL TUNNEL RELEASE Left    CARPAL TUNNEL RELEASE Bilateral    CHOLECYSTECTOMY  2026   COLONOSCOPY     COLONOSCOPY W/ POLYPECTOMY     EYE SURGERY Bilateral 1958, 2004   cataracts   INGUINAL HERNIA REPAIR     JOINT REPLACEMENT  2002   TOTAL KNEE ARTHROPLASTY Right 2002   ulnar intrapment release  07/2008   VARICOSE VEIN SURGERY Left 1980's per patient   Patient Active Problem List   Diagnosis Date Noted   B12 deficiency 02/23/2023   Vitamin D  deficiency 02/23/2023   Thrombocytopenia (HCC) 09/20/2022   Neuropathy 09/20/2022   Chronic back pain 09/20/2022   Dyslipidemia 09/20/2022   Cognitive impairment 09/20/2022   Tinnitus 09/20/2022   CKD (chronic kidney disease), stage III (HCC) 05/04/2020   Advanced nonexudative age-related macular degeneration of right eye with subfoveal involvement 01/27/2020   Advanced nonexudative age-related macular degeneration of left eye without subfoveal involvement 01/27/2020   Myopic macular degeneration of both eyes 01/27/2020   Exudative age-related macular degeneration of left eye with active choroidal neovascularization (HCC) 01/27/2020   Generalized anxiety disorder 03/07/2016   History of colon polyps 02/17/2010   BPH (benign prostatic hyperplasia) 06/03/2008   Gout 08/03/2007   Essential hypertension 03/02/2007   REFERRING PROVIDER:  Worth Kitty MD  REFERRING DIAG: Acute pain of right shoulder.    THERAPY DIAG:  Acute pain of right shoulder  Stiffness of right shoulder, not elsewhere classified  Muscle weakness (generalized)  Rationale for Evaluation and Treatment: Rehabilitation  ONSET DATE: Couple months ago.  SUBJECTIVE:                                                                                                                                                                                      SUBJECTIVE STATEMENT: Rt shldr pain comes and goes. Not bad right **Note De-Identified [place] [facility]bfuscation** now  PERTINENT HISTORY: See above.    PAIN:  Are you having pain? Yes: NPRS scale: 3-4/10.   Pain location: Right shoulder. Pain description: Ache. Aggravating factors: Certain movements.   Relieving factors: Pain medication.    PRECAUTIONS: None  RED FLAGS: None   WEIGHT BEARING RESTRICTIONS: No  FALLS:  Has patient fallen in last 6 months? Yes. Number of falls 2.  Trips.    LIVING ENVIRONMENT: Lives with: lives with their spouse Lives in: House/apartment Has following equipment at home: None  OCCUPATION: Retired.    PLOF: Independent  PATIENT GOALS:Use right shoulder without pain.    NEXT MD VISIT:   OBJECTIVE:   PATIENT SURVEYS:  Quick DASH: 56.82  POSTURE: Rounded shoulders.    UPPER EXTREMITY ROM:   Active antigravity flexion is 140 degrees (same on left), ER is 60 degrees and normal behind back motion.    UPPER EXTREMITY MMT: With elbow by side IR/ER strength to 4 to 4+/5.  Abduction graded grossly at 3+ to 4-/5.    SHOULDER SPECIAL TESTS: (-) right Drop Arm test.  PALPATION:  Tender to palpation over right bicipital groove.                                                                                                                               TREATMENT DATE:   11/23/23:  UBE x 8 minutes  at 90 RPMs  combo e'stim/US  at 1.50 W/CM2 x 8 minutes to patient's right anteriolateral shoulder  STW/M x  8 minutes to RT deltoid and Utrap  IFC at 80-150 Hz on 40% scan x 15 minutes.     11-13-23                                   EXERCISE LOG   RT shldr  Exercise Repetitions and Resistance Comments  UBE 90 RPMs x 6 mins    RW 4 Red Tband 2x15 each   Shldr extension Red  tband 2x15             Blank cell = exercise not performed today   IFC at 80-150 Hz on 40% scan x 15 minutes to patient's right shoulder    Normal modality response following removal of modality.   PATIENT EDUCATION: Education details:  Person educated:  International aid/development worker:  Education comprehension:   HOME EXERCISE PROGRAM:   ASSESSMENT:  CLINICAL IMPRESSION: Patient arrived today doing fairly well with RT shldr pain 3-4/10. He was able to continue with exs f/b US  combo/ STW as well as IFC to RT shldr.  Reports decreased pain end of session.   Normal modality response following removal of modality.  OBJECTIVE IMPAIRMENTS: decreased activity tolerance, decreased ROM, decreased strength, and pain.   ACTIVITY LIMITATIONS: carrying and lifting  PARTICIPATION LIMITATIONS: meal prep, cleaning, and laundry   REHAB POTENTIAL: Good  CLINICAL DECISION MAKING: **Note De-Identified [place] [facility]bfuscation** Stable/uncomplicated  EVALUATION COMPLEXITY: Low   GOALS:  SHORT TERM GOALS: Target date: 11/21/23  Ind with an initial HEP. Goal status: MET   LONG TERM GOALS: Target date: 12/19/23  Ind with an advanced HEP.  Goal status: INITIAL  2.  Improve right shoulder ER to 70 degrees+ to make donning and doffing apparel easier.  Goal status: INITIAL  3.  Patient able to sleep on right side.  Goal status: INITIAL  4.  Perform ADL's with pain not > 2-3/10.  Goal status: INITIAL  5.  Improve Quick DASH score by 20%.  Goal status: INITIAL  PLAN:  PT FREQUENCY: 2x/week  PT DURATION: 6 weeks  PLANNED INTERVENTIONS: 97110-Therapeutic exercises, 97530- Therapeutic activity, [id number]- Neuromuscular re-education, 97535- Self Care, [id number]- Manual therapy,  [id number]- Electrical stimulation (unattended), 97035- Ultrasound, 02966- Ionotophoresis 4mg /ml Dexamethasone, Patient/Family education, Cryotherapy, and Moist heat  PLAN FOR NEXT SESSION: Combo e'stim/US , STW/M, UE ranger, right shoulder strengthening.     Constantino Starace,CHRIS, PTA 11/23/2023, 5:37 PM

## 2023-11-28 ENCOUNTER — Encounter: Payer: Self-pay | Admitting: Physical Therapy

## 2023-11-28 ENCOUNTER — Ambulatory Visit: Admitting: Physical Therapy

## 2023-11-28 DIAGNOSIS — M6281 Muscle weakness (generalized): Secondary | ICD-10-CM

## 2023-11-28 DIAGNOSIS — M25511 Pain in right shoulder: Secondary | ICD-10-CM

## 2023-11-28 DIAGNOSIS — M25611 Stiffness of right shoulder, not elsewhere classified: Secondary | ICD-10-CM

## 2023-11-28 NOTE — Therapy (Signed)
**Note De-Identified [facility]or Obfuscation** OUTPATIENT PHYSICAL THERAPY SHOULDER TREATMENT   Patient Name: Daniel Brock MRN: [id number] DOB:22-Oct-1945, 78 y.o., male Today's Date: 11/28/2023  END OF SESSION:  PT End of Session - 11/28/23 1106     Visit Number 5    Number of Visits 12    Date for PT Re-Evaluation 12/19/23    PT Start Time 1100    PT Stop Time 1155    PT Time Calculation (min) 55 min    Activity Tolerance Patient tolerated treatment well    Behavior During Therapy WFL for tasks assessed/performed           Past Medical History:  Diagnosis Date   Allergy    Anxiety    Cataract    Chronic kidney disease    Depression 2016   Gout    Hiatal hernia    Hyperlipidemia    Hypertension    Renal insufficiency    Varicose veins    Past Surgical History:  Procedure Laterality Date   CARPAL TUNNEL RELEASE Left    CARPAL TUNNEL RELEASE Bilateral    CHOLECYSTECTOMY  2026   COLONOSCOPY     COLONOSCOPY W/ POLYPECTOMY     EYE SURGERY Bilateral 1958, 2004   cataracts   INGUINAL HERNIA REPAIR     JOINT REPLACEMENT  2002   TOTAL KNEE ARTHROPLASTY Right 2002   ulnar intrapment release  07/2008   VARICOSE VEIN SURGERY Left 1980's per patient   Patient Active Problem List   Diagnosis Date Noted   B12 deficiency 02/23/2023   Vitamin D  deficiency 02/23/2023   Thrombocytopenia (HCC) 09/20/2022   Neuropathy 09/20/2022   Chronic back pain 09/20/2022   Dyslipidemia 09/20/2022   Cognitive impairment 09/20/2022   Tinnitus 09/20/2022   CKD (chronic kidney disease), stage III (HCC) 05/04/2020   Advanced nonexudative age-related macular degeneration of right eye with subfoveal involvement 01/27/2020   Advanced nonexudative age-related macular degeneration of left eye without subfoveal involvement 01/27/2020   Myopic macular degeneration of both eyes 01/27/2020   Exudative age-related macular degeneration of left eye with active choroidal neovascularization (HCC) 01/27/2020   Generalized anxiety disorder  03/07/2016   History of colon polyps 02/17/2010   BPH (benign prostatic hyperplasia) 06/03/2008   Gout 08/03/2007   Essential hypertension 03/02/2007   REFERRING PROVIDER: Worth Kitty MD  REFERRING DIAG: Acute pain of right shoulder.    THERAPY DIAG:  Acute pain of right shoulder  Stiffness of right shoulder, not elsewhere classified  Muscle weakness (generalized)  Rationale for Evaluation and Treatment: Rehabilitation  ONSET DATE: Couple months ago.  SUBJECTIVE: **Note De-Identified [place]p Obfuscation** SUBJECTIVE STATEMENT: Doing okay.  PERTINENT HISTORY: See above.    PAIN:  Are you having pain? Yes: NPRS scale: 3-4/10.   Pain location: Right shoulder. Pain description: Ache. Aggravating factors: Certain movements.   Relieving factors: Pain medication.    PRECAUTIONS: None  RED FLAGS: None   WEIGHT BEARING RESTRICTIONS: No  FALLS:  Has patient fallen in last 6 months? Yes. Number of falls 2.  Trips.    LIVING ENVIRONMENT: Lives with: lives with their spouse Lives in: House/apartment Has following equipment at home: None  OCCUPATION: Retired.    PLOF: Independent  PATIENT GOALS:Use right shoulder without pain.    NEXT MD VISIT:   OBJECTIVE:   PATIENT SURVEYS:  Quick DASH: 56.82  POSTURE: Rounded shoulders.    UPPER EXTREMITY ROM:   Active antigravity flexion is 140 degrees (same on left), ER is 60 degrees and normal behind back motion.    UPPER EXTREMITY MMT: With elbow by side IR/ER strength to 4 to 4+/5.  Abduction graded grossly at 3+ to 4-/5.    SHOULDER SPECIAL TESTS: (-) right Drop Arm test.  PALPATION:  Tender to palpation over right bicipital groove.                                                                                                                               TREATMENT  DATE:   11/28/23:  UBE x 8 minutes f/b red theraband ER in standing and sdly ER with 2# both exercises performed to fatigue x 2 f/b PROM x 11 minutes f/b IFC at 80-150 Hz on 40% scan x 20 minutes with HMP.    11/23/23:  UBE x 8 minutes  at 90 RPMs  combo e'stim/US  at 1.50 W/CM2 x 8 minutes to patient's right anteriolateral shoulder  STW/M x 8 minutes to RT deltoid and Utrap  IFC at 80-150 Hz on 40% scan x 15 minutes.     11-13-23                                   EXERCISE LOG   RT shldr  Exercise Repetitions and Resistance Comments  UBE 90 RPMs x 6 mins    RW 4 Red Tband 2x15 each   Shldr extension Red  tband 2x15             Blank cell = exercise not performed today   IFC at 80-150 Hz on 40% scan x 15 minutes to patient's right shoulder    Normal modality response following removal of modality.   PATIENT EDUCATION: Education details:  Person educated:  International aid/development worker:  Education comprehension:   HOME EXERCISE PROGRAM:   ASSESSMENT:  CLINICAL IMPRESSION: Patient arrived today doing fairly. Added sdly ER with 2#.  His passive right shoulder Er has improved nicely.    OBJECTIVE IMPAIRMENTS: decreased activity tolerance, decreased ROM, decreased strength, and pain. **Note De-Identified [place] [facility]uscation** ACTIVITY LIMITATIONS: carrying and lifting  PARTICIPATION LIMITATIONS: meal prep, cleaning, and laundry   REHAB POTENTIAL: Good  CLINICAL DECISION MAKING: Stable/uncomplicated  EVALUATION COMPLEXITY: Low   GOALS:  SHORT TERM GOALS: Target date: 11/21/23  Ind with an initial HEP. Goal status: MET   LONG TERM GOALS: Target date: 12/19/23  Ind with an advanced HEP.  Goal status: INITIAL  2.  Improve right shoulder ER to 70 degrees+ to make donning and doffing apparel easier.  Goal status: INITIAL  3.  Patient able to sleep on right side.  Goal status: INITIAL  4.  Perform ADL's with pain not > 2-3/10.  Goal status: INITIAL  5.  Improve Quick DASH score by 20%.  Goal status:  INITIAL  PLAN:  PT FREQUENCY: 2x/week  PT DURATION: 6 weeks  PLANNED INTERVENTIONS: 97110-Therapeutic exercises, 97530- Therapeutic activity, [id number]- Neuromuscular re-education, 97535- Self Care, 02859- Manual therapy, G0283- Electrical stimulation (unattended), 97035- Ultrasound, 02966- Ionotophoresis 4mg /ml Dexamethasone, Patient/Family education, Cryotherapy, and Moist heat  PLAN FOR NEXT SESSION: Combo e'stim/US , STW/M, UE ranger, right shoulder strengthening.     Aryanne Gilleland, [place], PT 11/28/2023, 12:08 PM

## 2023-11-30 ENCOUNTER — Ambulatory Visit: Admitting: Physical Therapy

## 2023-11-30 DIAGNOSIS — M6281 Muscle weakness (generalized): Secondary | ICD-10-CM

## 2023-11-30 DIAGNOSIS — M25611 Stiffness of right shoulder, not elsewhere classified: Secondary | ICD-10-CM

## 2023-11-30 DIAGNOSIS — M25511 Pain in right shoulder: Secondary | ICD-10-CM

## 2023-11-30 NOTE — Therapy (Signed)
**Note De-Identified [place] [facility]** OUTPATIENT PHYSICAL THERAPY SHOULDER TREATMENT   Patient Name: Daniel Brock MRN: [id number] DOB:[birth year], 78 y.o., male Today's Date: 11/30/2023  END OF SESSION:  PT End of Session - 11/30/23 1142     Visit Number 6    Number of Visits 12    Date for PT Re-Evaluation 12/19/23    PT Start Time 1100    Activity Tolerance Patient tolerated treatment well    Behavior During Therapy [facility] for tasks assessed/performed            Past Medical History:  Diagnosis Date   Allergy    Anxiety    Cataract    Chronic kidney disease    Depression 2016   Gout    Hiatal hernia    Hyperlipidemia    Hypertension    Renal insufficiency    Varicose veins    Past Surgical History:  Procedure Laterality Date   CARPAL TUNNEL RELEASE Left    CARPAL TUNNEL RELEASE Bilateral    CHOLECYSTECTOMY  2026   COLONOSCOPY     COLONOSCOPY W/ POLYPECTOMY     EYE SURGERY Bilateral 1958, 2004   cataracts   INGUINAL HERNIA REPAIR     JOINT REPLACEMENT  2002   TOTAL KNEE ARTHROPLASTY Right 2002   ulnar intrapment release  07/2008   VARICOSE VEIN SURGERY Left 1980's per patient   Patient Active Problem List   Diagnosis Date Noted   B12 deficiency 02/23/2023   Vitamin D  deficiency 02/23/2023   Thrombocytopenia (HCC) 09/20/2022   Neuropathy 09/20/2022   Chronic back pain 09/20/2022   Dyslipidemia 09/20/2022   Cognitive impairment 09/20/2022   Tinnitus 09/20/2022   CKD (chronic kidney disease), stage III (HCC) 05/04/2020   Advanced nonexudative age-related macular degeneration of right eye with subfoveal involvement 01/27/2020   Advanced nonexudative age-related macular degeneration of left eye without subfoveal involvement 01/27/2020   Myopic macular degeneration of both eyes 01/27/2020   Exudative age-related macular degeneration of left eye with active choroidal neovascularization (HCC) 01/27/2020   Generalized anxiety disorder 03/07/2016   History of colon polyps 02/17/2010   BPH  (benign prostatic hyperplasia) 06/03/2008   Gout 08/03/2007   Essential hypertension 03/02/2007   REFERRING PROVIDER: Worth Kitty MD  REFERRING DIAG: Acute pain of right shoulder.    THERAPY DIAG:  Acute pain of right shoulder  Stiffness of right shoulder, not elsewhere classified  Muscle weakness (generalized)  Rationale for Evaluation and Treatment: Rehabilitation  ONSET DATE: Couple months ago.  SUBJECTIVE: **Note De-Identified [place] [facility]bfuscation** SUBJECTIVE STATEMENT: Shoulder doing pretty good today.  PERTINENT HISTORY: See above.    PAIN:  Are you having pain? Yes: NPRS scale: 3-4/10.   Pain location: Right shoulder. Pain description: Ache. Aggravating factors: Certain movements.   Relieving factors: Pain medication.    PRECAUTIONS: None  RED FLAGS: None   WEIGHT BEARING RESTRICTIONS: No  FALLS:  Has patient fallen in last 6 months? Yes. Number of falls 2.  Trips.    LIVING ENVIRONMENT: Lives with: lives with their spouse Lives in: House/apartment Has following equipment at home: None  OCCUPATION: Retired.    PLOF: Independent  PATIENT GOALS:Use right shoulder without pain.    NEXT MD VISIT:   OBJECTIVE:   PATIENT SURVEYS:  Quick DASH: 56.82  POSTURE: Rounded shoulders.    UPPER EXTREMITY ROM:   Active antigravity flexion is 140 degrees (same on left), ER is 60 degrees and normal behind back motion.    UPPER EXTREMITY MMT: With elbow by side IR/ER strength to 4 to 4+/5.  Abduction graded grossly at 3+ to 4-/5.    SHOULDER SPECIAL TESTS: (-) right Drop Arm test.  PALPATION:  Tender to palpation over right bicipital groove.                                                                                                                               TREATMENT DATE:   11/30/23:  UBE x 8 minutes  f/b STW/M x 15 minutes with TP release to right middle deltoid, UT and post cuff f/b  HMP and IFC at 80-150 Hz on 40% scan x 20 minutes.  Normal modality response following removal of modality    11/28/23:  UBE x 8 minutes f/b red theraband ER in standing and sdly ER with 2# both exercises performed to fatigue x 2 f/b PROM x 11 minutes f/b IFC at 80-150 Hz on 40% scan x 20 minutes with HMP.     PATIENT EDUCATION: Education details:  Person educated:  International aid/development worker:  Education comprehension:   HOME EXERCISE PROGRAM:   ASSESSMENT:  CLINICAL IMPRESSION: Patient arrived today doing better.  He did well with soft tissue work today.    OBJECTIVE IMPAIRMENTS: decreased activity tolerance, decreased ROM, decreased strength, and pain.   ACTIVITY LIMITATIONS: carrying and lifting  PARTICIPATION LIMITATIONS: meal prep, cleaning, and laundry   REHAB POTENTIAL: Good  CLINICAL DECISION MAKING: Stable/uncomplicated  EVALUATION COMPLEXITY: Low   GOALS:  SHORT TERM GOALS: Target date: 11/21/23  Ind with an initial HEP. Goal status: MET   LONG TERM GOALS: Target date: 12/19/23  Ind with an advanced HEP.  Goal status: INITIAL  2.  Improve right shoulder ER to 70 degrees+ to make donning and doffing apparel easier.  Goal status: INITIAL  3.  Patient able to sleep on right side.  Goal status: INITIAL  4.  Perform ADL's with pain not > 2-3/10.  Goal status: INITIAL  5.  Improve Quick DASH **Note De-Identified [place] Obfuscation** score by 20%.  Goal status: INITIAL  PLAN:  PT FREQUENCY: 2x/week  PT DURATION: 6 weeks  PLANNED INTERVENTIONS: 97110-Therapeutic exercises, 97530- Therapeutic activity, [id number]- Neuromuscular re-education, 97535- Self Care, 02859- Manual therapy, G0283- Electrical stimulation (unattended), 97035- Ultrasound, 02966- Ionotophoresis 4mg /ml Dexamethasone, Patient/Family education, Cryotherapy, and Moist heat  PLAN FOR NEXT SESSION: Combo e'stim/US , STW/M, UE ranger, right shoulder  strengthening.     Daniel Brock, [place], PT 11/30/2023, 12:03 PM

## 2023-12-05 ENCOUNTER — Ambulatory Visit: Admitting: Physical Therapy

## 2023-12-05 DIAGNOSIS — M25511 Pain in right shoulder: Secondary | ICD-10-CM

## 2023-12-05 DIAGNOSIS — M25611 Stiffness of right shoulder, not elsewhere classified: Secondary | ICD-10-CM

## 2023-12-05 DIAGNOSIS — M6281 Muscle weakness (generalized): Secondary | ICD-10-CM

## 2023-12-05 NOTE — Therapy (Signed)
**Note De-Identified [place] [facility]uscation** OUTPATIENT PHYSICAL THERAPY SHOULDER TREATMENT   Patient Name: Daniel Brock MRN: [id number] DOB:March 06, 1946, 78 y.o., male Today's Date: 12/05/2023  END OF SESSION:  PT End of Session - 12/05/23 1627     Visit Number 7    Number of Visits 12    Date for PT Re-Evaluation 12/19/23    PT Start Time 0230    PT Stop Time 0322    PT Time Calculation (min) 52 min    Activity Tolerance Patient tolerated treatment well    Behavior During Therapy [facility] for tasks assessed/performed             Past Medical History:  Diagnosis Date   Allergy    Anxiety    Cataract    Chronic kidney disease    Depression 2016   Gout    Hiatal hernia    Hyperlipidemia    Hypertension    Renal insufficiency    Varicose veins    Past Surgical History:  Procedure Laterality Date   CARPAL TUNNEL RELEASE Left    CARPAL TUNNEL RELEASE Bilateral    CHOLECYSTECTOMY  2026   COLONOSCOPY     COLONOSCOPY W/ POLYPECTOMY     EYE SURGERY Bilateral 1958, 2004   cataracts   INGUINAL HERNIA REPAIR     JOINT REPLACEMENT  2002   TOTAL KNEE ARTHROPLASTY Right 2002   ulnar intrapment release  07/2008   VARICOSE VEIN SURGERY Left 1980's per patient   Patient Active Problem List   Diagnosis Date Noted   B12 deficiency 02/23/2023   Vitamin D  deficiency 02/23/2023   Thrombocytopenia (HCC) 09/20/2022   Neuropathy 09/20/2022   Chronic back pain 09/20/2022   Dyslipidemia 09/20/2022   Cognitive impairment 09/20/2022   Tinnitus 09/20/2022   CKD (chronic kidney disease), stage III (HCC) 05/04/2020   Advanced nonexudative age-related macular degeneration of right eye with subfoveal involvement 01/27/2020   Advanced nonexudative age-related macular degeneration of left eye without subfoveal involvement 01/27/2020   Myopic macular degeneration of both eyes 01/27/2020   Exudative age-related macular degeneration of left eye with active choroidal neovascularization (HCC) 01/27/2020   Generalized anxiety disorder  03/07/2016   History of colon polyps 02/17/2010   BPH (benign prostatic hyperplasia) 06/03/2008   Gout 08/03/2007   Essential hypertension 03/02/2007   REFERRING PROVIDER: Worth Kitty MD  REFERRING DIAG: Acute pain of right shoulder.    THERAPY DIAG:  Acute pain of right shoulder  Stiffness of right shoulder, not elsewhere classified  Muscle weakness (generalized)  Rationale for Evaluation and Treatment: Rehabilitation  ONSET DATE: Couple months ago.  SUBJECTIVE: **Note De-Identified [place] Obfuscation** SUBJECTIVE STATEMENT: Shoulder hurting more today.  Did a lot of lifting Halloween decorations over the last two days.   PERTINENT HISTORY: See above.    PAIN:  Are you having pain? Yes: NPRS scale: 5+/10.   Pain location: Right shoulder. Pain description: Ache. Aggravating factors: Certain movements.   Relieving factors: Pain medication.    PRECAUTIONS: None  RED FLAGS: None   WEIGHT BEARING RESTRICTIONS: No  FALLS:  Has patient fallen in last 6 months? Yes. Number of falls 2.  Trips.    LIVING ENVIRONMENT: Lives with: lives with their spouse Lives in: House/apartment Has following equipment at home: None  OCCUPATION: Retired.    PLOF: Independent  PATIENT GOALS:Use right shoulder without pain.    NEXT MD VISIT:   OBJECTIVE:   PATIENT SURVEYS:  Quick DASH: 56.82  POSTURE: Rounded shoulders.    UPPER EXTREMITY ROM:   Active antigravity flexion is 140 degrees (same on left), ER is 60 degrees and normal behind back motion.    UPPER EXTREMITY MMT: With elbow by side IR/ER strength to 4 to 4+/5.  Abduction graded grossly at 3+ to 4-/5.    SHOULDER SPECIAL TESTS: (-) right Drop Arm test.  PALPATION:  Tender to palpation over right bicipital groove.                                                                                                                                TREATMENT DATE:   12/05/23:  Combo e'stim/US  at 1.50 W/CM2 x 12 minutes f/b STW/M x 12 minutes with TP release to right middle deltoid, UT and post cuff f/b HMP and IFC at 80-150 Hz on 40% scan x 20 minutes.  Normal modality response following removal of modality   11/30/23:  UBE x 8 minutes f/b STW/M x 15 minutes with TP release to right middle deltoid, UT and post cuff f/b  HMP and IFC at 80-150 Hz on 40% scan x 20 minutes.  Normal modality response following removal of modality    11/28/23:  UBE x 8 minutes f/b red theraband ER in standing and sdly ER with 2# both exercises performed to fatigue x 2 f/b PROM x 11 minutes f/b IFC at 80-150 Hz on 40% scan x 20 minutes with HMP.     PATIENT EDUCATION: Education details:  Person educated:  International aid/development worker:  Education comprehension:   HOME EXERCISE PROGRAM:   ASSESSMENT:  CLINICAL IMPRESSION: Patient with increased pain due to doing a lot of lifting over the last two days.  He tolerated treatment well today.  Good response to STW/M.    OBJECTIVE IMPAIRMENTS: decreased activity tolerance, decreased ROM, decreased strength, and pain.   ACTIVITY LIMITATIONS: carrying and lifting  PARTICIPATION LIMITATIONS: meal prep, cleaning, and laundry   REHAB POTENTIAL: Good  CLINICAL DECISION MAKING: Stable/uncomplicated  EVALUATION COMPLEXITY: Low   GOALS:  SHORT TERM GOALS: Target date: 11/21/23  Ind with an initial HEP. Goal **Note De-Identified [place] Obfuscation** status: MET   LONG TERM GOALS: Target date: 12/19/23  Ind with an advanced HEP.  Goal status: INITIAL  2.  Improve right shoulder ER to 70 degrees+ to make donning and doffing apparel easier.  Goal status: INITIAL  3.  Patient able to sleep on right side.  Goal status: INITIAL  4.  Perform ADL's with pain not > 2-3/10.  Goal status: INITIAL  5.  Improve Quick DASH score by 20%.  Goal status: INITIAL  PLAN:  PT FREQUENCY:  2x/week  PT DURATION: 6 weeks  PLANNED INTERVENTIONS: 97110-Therapeutic exercises, 97530- Therapeutic activity, [id number]- Neuromuscular re-education, 97535- Self Care, [id number]- Manual therapy, [id number]- Electrical stimulation (unattended), 97035- Ultrasound, 02966- Ionotophoresis 4mg /ml Dexamethasone, Patient/Family education, Cryotherapy, and Moist heat  PLAN FOR NEXT SESSION: Combo e'stim/US , STW/M, UE ranger, right shoulder strengthening.     Lucynda Rosano, [place], PT 12/05/2023, 4:32 PM

## 2023-12-07 ENCOUNTER — Ambulatory Visit

## 2023-12-07 DIAGNOSIS — M6281 Muscle weakness (generalized): Secondary | ICD-10-CM

## 2023-12-07 DIAGNOSIS — M25511 Pain in right shoulder: Secondary | ICD-10-CM | POA: Diagnosis not present

## 2023-12-07 DIAGNOSIS — M25611 Stiffness of right shoulder, not elsewhere classified: Secondary | ICD-10-CM

## 2023-12-07 NOTE — Therapy (Signed)
**Note De-Identified [place] [facility]bfuscation** OUTPATIENT PHYSICAL THERAPY SHOULDER TREATMENT   Patient Name: Deunte Bledsoe Drummond MRN: [id number] DOB:06-Mar-1946, 78 y.o., male Today's Date: 12/07/2023  END OF SESSION:  PT End of Session - 12/07/23 1432     Visit Number 8    Number of Visits 12    Date for Recertification  12/19/23    PT Start Time 1430    PT Stop Time 1520    PT Time Calculation (min) 50 min    Activity Tolerance Patient tolerated treatment well    Behavior During Therapy [facility] for tasks assessed/performed             Past Medical History:  Diagnosis Date   Allergy    Anxiety    Cataract    Chronic kidney disease    Depression 2016   Gout    Hiatal hernia    Hyperlipidemia    Hypertension    Renal insufficiency    Varicose veins    Past Surgical History:  Procedure Laterality Date   CARPAL TUNNEL RELEASE Left    CARPAL TUNNEL RELEASE Bilateral    CHOLECYSTECTOMY  2026   COLONOSCOPY     COLONOSCOPY W/ POLYPECTOMY     EYE SURGERY Bilateral 1958, 2004   cataracts   INGUINAL HERNIA REPAIR     JOINT REPLACEMENT  2002   TOTAL KNEE ARTHROPLASTY Right 2002   ulnar intrapment release  07/2008   VARICOSE VEIN SURGERY Left 1980's per patient   Patient Active Problem List   Diagnosis Date Noted   B12 deficiency 02/23/2023   Vitamin D  deficiency 02/23/2023   Thrombocytopenia (HCC) 09/20/2022   Neuropathy 09/20/2022   Chronic back pain 09/20/2022   Dyslipidemia 09/20/2022   Cognitive impairment 09/20/2022   Tinnitus 09/20/2022   CKD (chronic kidney disease), stage III (HCC) 05/04/2020   Advanced nonexudative age-related macular degeneration of right eye with subfoveal involvement 01/27/2020   Advanced nonexudative age-related macular degeneration of left eye without subfoveal involvement 01/27/2020   Myopic macular degeneration of both eyes 01/27/2020   Exudative age-related macular degeneration of left eye with active choroidal neovascularization (HCC) 01/27/2020   Generalized anxiety disorder  03/07/2016   History of colon polyps 02/17/2010   BPH (benign prostatic hyperplasia) 06/03/2008   Gout 08/03/2007   Essential hypertension 03/02/2007   REFERRING PROVIDER: Worth Kitty MD  REFERRING DIAG: Acute pain of right shoulder.    THERAPY DIAG:  Acute pain of right shoulder  Stiffness of right shoulder, not elsewhere classified  Muscle weakness (generalized)  Rationale for Evaluation and Treatment: Rehabilitation  ONSET DATE: Couple months ago.  SUBJECTIVE: **Note De-Identified [place] Obfuscation** SUBJECTIVE STATEMENT: Pt reports 2/10 right shoulder pain today.  Pt would like to go on hold, does not feel that therapy is helping.   PERTINENT HISTORY: See above.    PAIN:  Are you having pain? Yes: NPRS scale: 2/10.   Pain location: Right shoulder. Pain description: Ache. Aggravating factors: Certain movements.   Relieving factors: Pain medication.    PRECAUTIONS: None  RED FLAGS: None   WEIGHT BEARING RESTRICTIONS: No  FALLS:  Has patient fallen in last 6 months? Yes. Number of falls 2.  Trips.    LIVING ENVIRONMENT: Lives with: lives with their spouse Lives in: House/apartment Has following equipment at home: None  OCCUPATION: Retired.    PLOF: Independent  PATIENT GOALS:Use right shoulder without pain.    NEXT MD VISIT:   OBJECTIVE:   PATIENT SURVEYS:  Quick DASH: 56.82  POSTURE: Rounded shoulders.    UPPER EXTREMITY ROM:   Active antigravity flexion is 140 degrees (same on left), ER is 60 degrees and normal behind back motion.    UPPER EXTREMITY MMT: With elbow by side IR/ER strength to 4 to 4+/5.  Abduction graded grossly at 3+ to 4-/5.    SHOULDER SPECIAL TESTS: (-) right Drop Arm test.  PALPATION:  Tender to palpation over right bicipital groove.                                                                                                                                TREATMENT DATE:   12/07/23:  UBE x 8 mins  f/b STW/M  with TP release to right middle deltoid, UT and post cuff f/b HMP and IFC at 80-150 Hz on 40% scan x 15 minutes.  Normal modality response following removal of modality   11/30/23:  UBE x 8 minutes f/b STW/M x 15 minutes with TP release to right middle deltoid, UT and post cuff f/b  HMP and IFC at 80-150 Hz on 40% scan x 20 minutes.  Normal modality response following removal of modality    11/28/23:  UBE x 8 minutes f/b red theraband ER in standing and sdly ER with 2# both exercises performed to fatigue x 2 f/b PROM x 11 minutes f/b IFC at 80-150 Hz on 40% scan x 20 minutes with HMP.     PATIENT EDUCATION: Education details:  Person educated:  International aid/development worker:  Education comprehension:   HOME EXERCISE PROGRAM:   ASSESSMENT:  CLINICAL IMPRESSION: Pt arrives for today's treatment session reporting 2/10 right shoulder pain.  Pt states that he would like to go on hold at this time bc he does not feel that therapy has been helping with his shoulder.  Pt able to decrease QuickDash score to 41%, making good progress towards his goal.  Pt able to demonstrate 63 degrees of right shoulder ER and states that his pain usually increases to 4-5/10 with performance of ADLs.  Normal responses to estim noted upon removal.  Pt **Note De-Identified [place] Obfuscation** reported decreased pain at completion of today's treatment session.   OBJECTIVE IMPAIRMENTS: decreased activity tolerance, decreased ROM, decreased strength, and pain.   ACTIVITY LIMITATIONS: carrying and lifting  PARTICIPATION LIMITATIONS: meal prep, cleaning, and laundry   REHAB POTENTIAL: Good  CLINICAL DECISION MAKING: Stable/uncomplicated  EVALUATION COMPLEXITY: Low   GOALS:  SHORT TERM GOALS: Target date: 11/21/23  Ind with an initial HEP. Goal status: MET   LONG TERM GOALS: Target date: 12/19/23  Ind with an advanced HEP.   Goal status: IN PROGRESS  2.  Improve right shoulder ER to 70 degrees+ to make donning and doffing apparel easier.   9/18: 63 degrees Goal status: IN PROGRESS  3.  Patient able to sleep on right side. 9/18: unable to  Goal status: IN PROGRESS  4.  Perform ADL's with pain not > 2-3/10.   9/18: 4-5/10  Goal status: IN PROGRESS  5.  Improve Quick DASH score by 20%.   9/18: 41% Goal status: IN PROGRESS  PLAN:  PT FREQUENCY: 2x/week  PT DURATION: 6 weeks  PLANNED INTERVENTIONS: 97110-Therapeutic exercises, 97530- Therapeutic activity, [id number]- Neuromuscular re-education, 97535- Self Care, 02859- Manual therapy, [id number]- Electrical stimulation (unattended), 97035- Ultrasound, 02966- Ionotophoresis 4mg /ml Dexamethasone, Patient/Family education, Cryotherapy, and Moist heat  PLAN FOR NEXT SESSION: Combo e'stim/US , STW/M, UE ranger, right shoulder strengthening.     Delon DELENA Gosling, PTA 12/07/2023, 3:24 PM

## 2023-12-11 DIAGNOSIS — H4423 Degenerative myopia, bilateral: Secondary | ICD-10-CM | POA: Diagnosis not present

## 2023-12-11 DIAGNOSIS — H353124 Nonexudative age-related macular degeneration, left eye, advanced atrophic with subfoveal involvement: Secondary | ICD-10-CM | POA: Diagnosis not present

## 2023-12-11 DIAGNOSIS — H353114 Nonexudative age-related macular degeneration, right eye, advanced atrophic with subfoveal involvement: Secondary | ICD-10-CM | POA: Diagnosis not present

## 2023-12-11 DIAGNOSIS — H35371 Puckering of macula, right eye: Secondary | ICD-10-CM | POA: Diagnosis not present

## 2023-12-11 DIAGNOSIS — H353221 Exudative age-related macular degeneration, left eye, with active choroidal neovascularization: Secondary | ICD-10-CM | POA: Diagnosis not present

## 2024-01-22 DIAGNOSIS — H353221 Exudative age-related macular degeneration, left eye, with active choroidal neovascularization: Secondary | ICD-10-CM | POA: Diagnosis not present

## 2024-01-22 DIAGNOSIS — H35371 Puckering of macula, right eye: Secondary | ICD-10-CM | POA: Diagnosis not present

## 2024-01-22 DIAGNOSIS — H353124 Nonexudative age-related macular degeneration, left eye, advanced atrophic with subfoveal involvement: Secondary | ICD-10-CM | POA: Diagnosis not present

## 2024-01-22 DIAGNOSIS — H353114 Nonexudative age-related macular degeneration, right eye, advanced atrophic with subfoveal involvement: Secondary | ICD-10-CM | POA: Diagnosis not present

## 2024-01-22 DIAGNOSIS — H4423 Degenerative myopia, bilateral: Secondary | ICD-10-CM | POA: Diagnosis not present

## 2024-02-19 DIAGNOSIS — R7303 Prediabetes: Secondary | ICD-10-CM | POA: Diagnosis not present

## 2024-02-19 DIAGNOSIS — E119 Type 2 diabetes mellitus without complications: Secondary | ICD-10-CM | POA: Diagnosis not present

## 2024-02-19 DIAGNOSIS — N189 Chronic kidney disease, unspecified: Secondary | ICD-10-CM | POA: Diagnosis not present

## 2024-02-19 DIAGNOSIS — D631 Anemia in chronic kidney disease: Secondary | ICD-10-CM | POA: Diagnosis not present

## 2024-02-19 DIAGNOSIS — E211 Secondary hyperparathyroidism, not elsewhere classified: Secondary | ICD-10-CM | POA: Diagnosis not present

## 2024-02-19 DIAGNOSIS — R809 Proteinuria, unspecified: Secondary | ICD-10-CM | POA: Diagnosis not present

## 2024-02-28 DIAGNOSIS — R7303 Prediabetes: Secondary | ICD-10-CM | POA: Diagnosis not present

## 2024-02-28 DIAGNOSIS — I129 Hypertensive chronic kidney disease with stage 1 through stage 4 chronic kidney disease, or unspecified chronic kidney disease: Secondary | ICD-10-CM | POA: Diagnosis not present

## 2024-02-28 DIAGNOSIS — N2581 Secondary hyperparathyroidism of renal origin: Secondary | ICD-10-CM | POA: Diagnosis not present

## 2024-02-28 DIAGNOSIS — D696 Thrombocytopenia, unspecified: Secondary | ICD-10-CM | POA: Diagnosis not present

## 2024-05-03 ENCOUNTER — Encounter: Admitting: Family Medicine

## 2024-11-05 ENCOUNTER — Ambulatory Visit
